# Patient Record
Sex: Female | Born: 1995 | Race: White | Hispanic: No | Smoking: Current every day smoker
Health system: Southern US, Community
[De-identification: ages and names within clinical notes are randomized; demographics above are authoritative.]

## PROBLEM LIST (undated history)

## (undated) ENCOUNTER — Inpatient Hospital Stay (HOSPITAL_COMMUNITY): Payer: Self-pay

## (undated) DIAGNOSIS — N926 Irregular menstruation, unspecified: Secondary | ICD-10-CM

## (undated) DIAGNOSIS — J069 Acute upper respiratory infection, unspecified: Secondary | ICD-10-CM

## (undated) DIAGNOSIS — F32A Depression, unspecified: Secondary | ICD-10-CM

## (undated) DIAGNOSIS — F329 Major depressive disorder, single episode, unspecified: Secondary | ICD-10-CM

## (undated) DIAGNOSIS — J45909 Unspecified asthma, uncomplicated: Secondary | ICD-10-CM

## (undated) DIAGNOSIS — F419 Anxiety disorder, unspecified: Secondary | ICD-10-CM

## (undated) DIAGNOSIS — R51 Headache: Secondary | ICD-10-CM

## (undated) DIAGNOSIS — R87629 Unspecified abnormal cytological findings in specimens from vagina: Secondary | ICD-10-CM

## (undated) DIAGNOSIS — F4310 Post-traumatic stress disorder, unspecified: Secondary | ICD-10-CM

## (undated) DIAGNOSIS — R519 Headache, unspecified: Secondary | ICD-10-CM

## (undated) DIAGNOSIS — R0600 Dyspnea, unspecified: Secondary | ICD-10-CM

## (undated) DIAGNOSIS — I10 Essential (primary) hypertension: Secondary | ICD-10-CM

## (undated) HISTORY — DX: Irregular menstruation, unspecified: N92.6

## (undated) HISTORY — PX: CYST REMOVAL NECK: SHX6281

## (undated) HISTORY — PX: CHOLECYSTECTOMY: SHX55

## (undated) HISTORY — PX: TYMPANOSTOMY TUBE PLACEMENT: SHX32

## (undated) HISTORY — DX: Post-traumatic stress disorder, unspecified: F43.10

---
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
## 2013-03-18 LAB — OB RESULTS CONSOLE RPR: RPR: NONREACTIVE

## 2013-05-07 ENCOUNTER — Encounter: Payer: Self-pay | Admitting: Women's Health

## 2013-05-07 ENCOUNTER — Ambulatory Visit (INDEPENDENT_AMBULATORY_CARE_PROVIDER_SITE_OTHER): Payer: Medicaid Other | Admitting: Women's Health

## 2013-05-07 ENCOUNTER — Encounter: Payer: Medicaid Other | Admitting: Women's Health

## 2013-05-07 VITALS — BP 120/66 | Wt 215.0 lb

## 2013-05-07 DIAGNOSIS — Z3400 Encounter for supervision of normal first pregnancy, unspecified trimester: Secondary | ICD-10-CM | POA: Insufficient documentation

## 2013-05-07 DIAGNOSIS — Z3403 Encounter for supervision of normal first pregnancy, third trimester: Secondary | ICD-10-CM

## 2013-05-07 DIAGNOSIS — Z331 Pregnant state, incidental: Secondary | ICD-10-CM

## 2013-05-07 DIAGNOSIS — O368131 Decreased fetal movements, third trimester, fetus 1: Secondary | ICD-10-CM

## 2013-05-07 DIAGNOSIS — O99340 Other mental disorders complicating pregnancy, unspecified trimester: Secondary | ICD-10-CM

## 2013-05-07 DIAGNOSIS — O26899 Other specified pregnancy related conditions, unspecified trimester: Secondary | ICD-10-CM | POA: Insufficient documentation

## 2013-05-07 DIAGNOSIS — Z8659 Personal history of other mental and behavioral disorders: Secondary | ICD-10-CM | POA: Insufficient documentation

## 2013-05-07 DIAGNOSIS — F17200 Nicotine dependence, unspecified, uncomplicated: Secondary | ICD-10-CM

## 2013-05-07 DIAGNOSIS — A568 Sexually transmitted chlamydial infection of other sites: Secondary | ICD-10-CM

## 2013-05-07 DIAGNOSIS — F32A Depression, unspecified: Secondary | ICD-10-CM

## 2013-05-07 DIAGNOSIS — O360131 Maternal care for anti-D [Rh] antibodies, third trimester, fetus 1: Secondary | ICD-10-CM

## 2013-05-07 DIAGNOSIS — O09899 Supervision of other high risk pregnancies, unspecified trimester: Secondary | ICD-10-CM | POA: Insufficient documentation

## 2013-05-07 DIAGNOSIS — Z1389 Encounter for screening for other disorder: Secondary | ICD-10-CM

## 2013-05-07 DIAGNOSIS — O368190 Decreased fetal movements, unspecified trimester, not applicable or unspecified: Secondary | ICD-10-CM

## 2013-05-07 DIAGNOSIS — F329 Major depressive disorder, single episode, unspecified: Secondary | ICD-10-CM

## 2013-05-07 DIAGNOSIS — O360990 Maternal care for other rhesus isoimmunization, unspecified trimester, not applicable or unspecified: Secondary | ICD-10-CM

## 2013-05-07 LAB — POCT URINALYSIS DIPSTICK
Blood, UA: NEGATIVE
Glucose, UA: NEGATIVE
Nitrite, UA: NEGATIVE

## 2013-05-07 NOTE — Patient Instructions (Addendum)
Third Trimester of Pregnancy The third trimester is from week 29 through week 42, months 7 through 9. The third trimester is a time when the fetus is growing rapidly. At the end of the ninth month, the fetus is about 20 inches in length and weighs 6 10 pounds.  BODY CHANGES Your body goes through many changes during pregnancy. The changes vary from woman to woman.   Your weight will continue to increase. You can expect to gain 25 35 pounds (11 16 kg) by the end of the pregnancy.  You may begin to get stretch marks on your hips, abdomen, and breasts.  You may urinate more often because the fetus is moving lower into your pelvis and pressing on your bladder.  You may develop or continue to have heartburn as a result of your pregnancy.  You may develop constipation because certain hormones are causing the muscles that push waste through your intestines to slow down.  You may develop hemorrhoids or swollen, bulging veins (varicose veins).  You may have pelvic pain because of the weight gain and pregnancy hormones relaxing your joints between the bones in your pelvis. Back aches may result from over exertion of the muscles supporting your posture.  Your breasts will continue to grow and be tender. A yellow discharge may leak from your breasts called colostrum.  Your belly button may stick out.  You may feel short of breath because of your expanding uterus.  You may notice the fetus "dropping," or moving lower in your abdomen.  You may have a bloody mucus discharge. This usually occurs a few days to a week before labor begins.  Your cervix becomes thin and soft (effaced) near your due date. WHAT TO EXPECT AT YOUR PRENATAL EXAMS  You will have prenatal exams every 2 weeks until week 36. Then, you will have weekly prenatal exams. During a routine prenatal visit:  You will be weighed to make sure you and the fetus are growing normally.  Your blood pressure is taken.  Your abdomen will be  measured to track your baby's growth.  The fetal heartbeat will be listened to.  Any test results from the previous visit will be discussed.  You may have a cervical check near your due date to see if you have effaced. At around 36 weeks, your caregiver will check your cervix. At the same time, your caregiver will also perform a test on the secretions of the vaginal tissue. This test is to determine if a type of bacteria, Group B streptococcus, is present. Your caregiver will explain this further. Your caregiver may ask you:  What your birth plan is.  How you are feeling.  If you are feeling the baby move.  If you have had any abnormal symptoms, such as leaking fluid, bleeding, severe headaches, or abdominal cramping.  If you have any questions. Other tests or screenings that may be performed during your third trimester include:  Blood tests that check for low iron levels (anemia).  Fetal testing to check the health, activity level, and growth of the fetus. Testing is done if you have certain medical conditions or if there are problems during the pregnancy. FALSE LABOR You may feel small, irregular contractions that eventually go away. These are called Braxton Hicks contractions, or false labor. Contractions may last for hours, days, or even weeks before true labor sets in. If contractions come at regular intervals, intensify, or become painful, it is best to be seen by your caregiver.    SIGNS OF LABOR   Menstrual-like cramps.  Contractions that are 5 minutes apart or less.  Contractions that start on the top of the uterus and spread down to the lower abdomen and back.  A sense of increased pelvic pressure or back pain.  A watery or bloody mucus discharge that comes from the vagina. If you have any of these signs before the 37th week of pregnancy, call your caregiver right away. You need to go to the hospital to get checked immediately. HOME CARE INSTRUCTIONS   Avoid all  smoking, herbs, alcohol, and unprescribed drugs. These chemicals affect the formation and growth of the baby.  Follow your caregiver's instructions regarding medicine use. There are medicines that are either safe or unsafe to take during pregnancy.  Exercise only as directed by your caregiver. Experiencing uterine cramps is a good sign to stop exercising.  Continue to eat regular, healthy meals.  Wear a good support bra for breast tenderness.  Do not use hot tubs, steam rooms, or saunas.  Wear your seat belt at all times when driving.  Avoid raw meat, uncooked cheese, cat litter boxes, and soil used by cats. These carry germs that can cause birth defects in the baby.  Take your prenatal vitamins.  Try taking a stool softener (if your caregiver approves) if you develop constipation. Eat more high-fiber foods, such as fresh vegetables or fruit and whole grains. Drink plenty of fluids to keep your urine clear or pale yellow.  Take warm sitz baths to soothe any pain or discomfort caused by hemorrhoids. Use hemorrhoid cream if your caregiver approves.  If you develop varicose veins, wear support hose. Elevate your feet for 15 minutes, 3 4 times a day. Limit salt in your diet.  Avoid heavy lifting, wear low heal shoes, and practice good posture.  Rest a lot with your legs elevated if you have leg cramps or low back pain.  Visit your dentist if you have not gone during your pregnancy. Use a soft toothbrush to brush your teeth and be gentle when you floss.  A sexual relationship may be continued unless your caregiver directs you otherwise.  Do not travel far distances unless it is absolutely necessary and only with the approval of your caregiver.  Take prenatal classes to understand, practice, and ask questions about the labor and delivery.  Make a trial run to the hospital.  Pack your hospital bag.  Prepare the baby's nursery.  Continue to go to all your prenatal visits as directed  by your caregiver. SEEK MEDICAL CARE IF:  You are unsure if you are in labor or if your water has broken.  You have dizziness.  You have mild pelvic cramps, pelvic pressure, or nagging pain in your abdominal area.  You have persistent nausea, vomiting, or diarrhea.  You have a bad smelling vaginal discharge.  You have pain with urination. SEEK IMMEDIATE MEDICAL CARE IF:   You have a fever.  You are leaking fluid from your vagina.  You have spotting or bleeding from your vagina.  You have severe abdominal cramping or pain.  You have rapid weight loss or gain.  You have shortness of breath with chest pain.  You notice sudden or extreme swelling of your face, hands, ankles, feet, or legs.  You have not felt your baby move in over an hour.  You have severe headaches that do not go away with medicine.  You have vision changes. Document Released: 05/25/2001 Document Revised: 01/31/2013 Document Reviewed:   08/01/2012 [employer] Patient Information 2014 [employer]  Preterm Labor Information Preterm labor is when labor starts at less than 37 weeks of pregnancy. The normal length of a pregnancy is 39 to 41 weeks. CAUSES Often, there is no identifiable underlying cause as to why a woman goes into preterm labor. One of the most common known causes of preterm labor is infection. Infections of the uterus, cervix, vagina, amniotic sac, bladder, kidney, or even the lungs (pneumonia) can cause labor to start. Other suspected causes of preterm labor include:   Urogenital infections, such as yeast infections and bacterial vaginosis.   Uterine abnormalities (uterine shape, uterine septum, fibroids, or bleeding from the placenta).   A cervix that has been operated on (it may fail to stay closed).   Malformations in the fetus.   Multiple gestations (twins, triplets, and so on).   Breakage of the amniotic sac.  RISK FACTORS  Having a previous history of preterm labor.    Having premature rupture of membranes (PROM).   Having a placenta that covers the opening of the cervix (placenta previa).   Having a placenta that separates from the uterus (placental abruption).   Having a cervix that is too weak to hold the fetus in the uterus (incompetent cervix).   Having too much fluid in the amniotic sac (polyhydramnios).   Taking illegal drugs or smoking while pregnant.   Not gaining enough weight while pregnant.   Being younger than 18 and older than 19 years old.   Having a low socioeconomic status.   Being African American. SYMPTOMS Signs and symptoms of preterm labor include:   Menstrual-like cramps, abdominal pain, or back pain.  Uterine contractions that are regular, as frequent as six in an hour, regardless of their intensity (may be mild or painful).  Contractions that start on the top of the uterus and spread down to the lower abdomen and back.   A sense of increased pelvic pressure.   A watery or bloody mucus discharge that comes from the vagina.  TREATMENT Depending on the length of the pregnancy and other circumstances, your health care provider may suggest bed rest. If necessary, there are medicines that can be given to stop contractions and to mature the fetal lungs. If labor happens before 34 weeks of pregnancy, a prolonged hospital stay may be recommended. Treatment depends on the condition of both you and the fetus.  WHAT SHOULD YOU DO IF YOU THINK YOU ARE IN PRETERM LABOR? Call your health care provider right away. You will need to go to the hospital to get checked immediately. HOW CAN YOU PREVENT PRETERM LABOR IN FUTURE PREGNANCIES? You should:   Stop smoking if you smoke.  Maintain healthy weight gain and avoid chemicals and drugs that are not necessary.  Be watchful for any type of infection.  Inform your health care provider if you have a known history of preterm labor. Document Released: 08/21/2003 Document  Revised: 01/31/2013 Document Reviewed: 07/03/2012 [employer] Patient Information 2014 [employer].  

## 2013-05-07 NOTE — Progress Notes (Signed)
  Subjective:    [name] is a 17 y.o. G1P0 Caucasian female at [redacted]w[redacted]d by 12wk u/s, being seen today for her first obstetrical visit w/ [place].  She is a transfer from [facility] in [place] where she initiated pnc early first trimester. Her obstetrical history is significant for smoker. She was smoking 1.5ppd prior to pregnancy, now is down to 1/2ppd. H/o depression, no current meds, doing well. She had chlamydia early 1st trimester, was treated and had neg POC. She also had 1st trimester bleeding and received rhogam.  Her varicella is equivocal. She had normal genetic screening, 1hr glucola 123, declined CF screening, normal female anatomy u/s. Pregnancy history fully reviewed.   Patient reports no fm today, only hiccups. LOF when she woke up this am, panties were wet, none since. Denies vb.   Filed Vitals:   05/07/13 1600  BP: 120/66  Weight: 215 lb (97.523 kg)    HISTORY: OB History  Gravida Para Term Preterm AB SAB TAB Ectopic Multiple Living  1             # Outcome Date GA Lbr Len/2nd Weight Sex Delivery Anes PTL Lv  1 CUR              No past medical history on file. Past Surgical History  Procedure Laterality Date  . Tympanostomy tube placement    . Cyst removal neck    . Tympanostomy tube placement     Family History  Problem Relation Age of Onset  . Bipolar disorder Maternal Grandmother   . Heart disease Maternal Grandfather   . Thyroid disease Paternal Grandmother   . Arthritis Mother   . Fibromyalgia Mother   . Birth defects Father      Exam    Pelvic Exam:    Perineum: Normal Perineum   Vulva: normal   Vagina:  normal mucosa, normal discharge, no palpable nodules   Uterus   FH 33     Cervix: Normal, neg pooling. +mucoid d/c, neg wet prep, neg fern. 1/50/-3, vtx   Adnexa: Not palpable   Urinary: urethral meatus normal    System:     Skin: normal coloration and turgor, no rashes    Neurologic: oriented, normal mood   Extremities: normal strength, tone, and muscle mass   HEENT PERRLA   Mouth/Teeth mucous membranes moist   Cardiovascular: regular rate and rhythm   Respiratory:  appears well, vitals normal, no respiratory distress, acyanotic, normal RR   Abdomen: soft, non-tender    Thin prep pap smear n/a d/t age   Reactive NST: FHR 135, mod variability, 15x15accels, no decels=Cat I FHR UCs: none   Assessment:    Pregnancy: G1P0 There are no active problems to display for this patient.     [redacted]w[redacted]d G1P0 New OB visit, transfer from [place] Smoker H/O depression, no meds H/O chlamydia 1st trimester w/ neg POC Rh neg, has received 2 doses of Rhogam  Varicella equivocal Decreased FM w/ reactive NST    Plan:      Continue prenatal vitamins Problem list reviewed and updated Reviewed recommended weight gain based on pre-gravid BMI Encouraged well-balanced diet Genetic Screening discussed Integrated Screen: results reviewed Cystic fibrosis screening discussed declined Ultrasound discussed; fetal survey: results reviewed Follow up in 1 week for visit Reviewed ptl s/s, fkc To continue decreasing smoking w/ goal of cessation  Marge Duncans 05/07/2013 4:12 PM

## 2013-05-08 ENCOUNTER — Inpatient Hospital Stay (HOSPITAL_COMMUNITY): Payer: Medicaid Other

## 2013-05-08 ENCOUNTER — Inpatient Hospital Stay (HOSPITAL_COMMUNITY)
Admission: AD | Admit: 2013-05-08 | Discharge: 2013-05-08 | Disposition: A | Discharge status: Home / Self Care | Payer: Medicaid Other | Source: Ambulatory Visit | Attending: Family Medicine | Admitting: Family Medicine

## 2013-05-08 ENCOUNTER — Telehealth: Payer: Self-pay | Admitting: Obstetrics and Gynecology

## 2013-05-08 ENCOUNTER — Encounter: Payer: Self-pay | Admitting: *Deleted

## 2013-05-08 ENCOUNTER — Telehealth: Payer: Self-pay | Admitting: *Deleted

## 2013-05-08 ENCOUNTER — Encounter (HOSPITAL_COMMUNITY): Payer: Self-pay | Admitting: *Deleted

## 2013-05-08 DIAGNOSIS — O26853 Spotting complicating pregnancy, third trimester: Secondary | ICD-10-CM

## 2013-05-08 DIAGNOSIS — R109 Unspecified abdominal pain: Secondary | ICD-10-CM | POA: Insufficient documentation

## 2013-05-08 DIAGNOSIS — O26859 Spotting complicating pregnancy, unspecified trimester: Secondary | ICD-10-CM

## 2013-05-08 DIAGNOSIS — O4690 Antepartum hemorrhage, unspecified, unspecified trimester: Secondary | ICD-10-CM | POA: Insufficient documentation

## 2013-05-08 DIAGNOSIS — O4700 False labor before 37 completed weeks of gestation, unspecified trimester: Secondary | ICD-10-CM | POA: Insufficient documentation

## 2013-05-08 DIAGNOSIS — M549 Dorsalgia, unspecified: Secondary | ICD-10-CM | POA: Insufficient documentation

## 2013-05-08 LAB — COMPREHENSIVE METABOLIC PANEL
ALT: 9 U/L (ref 0–35)
AST: 11 U/L (ref 0–37)
Alkaline Phosphatase: 164 U/L — ABNORMAL HIGH (ref 39–117)
CO2: 23 mEq/L (ref 19–32)
Calcium: 9 mg/dL (ref 8.4–10.5)
Chloride: 103 mEq/L (ref 96–112)
GFR calc non Af Amer: >90 mL/min (ref >90)
Potassium: 4 mEq/L (ref 3.5–5.1)
Sodium: 137 mEq/L (ref 135–145)
Total Bilirubin: 0.2 mg/dL — ABNORMAL LOW (ref 0.3–1.2)

## 2013-05-08 LAB — URINALYSIS, ROUTINE W REFLEX MICROSCOPIC
Bilirubin Urine: NEGATIVE
Glucose, UA: NEGATIVE mg/dL
Nitrite: NEGATIVE
Urobilinogen, UA: 0.2 mg/dL (ref 0.0–1.0)

## 2013-05-08 LAB — CBC
Platelets: 187 10*3/uL (ref 150–400)
RBC: 4.04 MIL/uL (ref 3.87–5.11)
WBC: 12.2 10*3/uL — ABNORMAL HIGH (ref 4.0–10.5)

## 2013-05-08 LAB — WET PREP, GENITAL: Yeast Wet Prep HPF POC: NONE SEEN

## 2013-05-08 LAB — URINE MICROSCOPIC-ADD ON

## 2013-05-08 MED ORDER — ACETAMINOPHEN 325 MG PO TABS
650.0000 mg | ORAL_TABLET | Freq: Four times a day (QID) | ORAL | Status: DC | PRN
  Administered 2013-05-08: 650 mg via ORAL
  Filled 2013-05-08: qty 2

## 2013-05-08 MED ORDER — RHO D IMMUNE GLOBULIN 1500 UNIT/2ML IJ SOLN
300.0000 ug | Freq: Once | INTRAMUSCULAR | Status: DC
  Filled 2013-05-08: qty 2

## 2013-05-08 NOTE — Telephone Encounter (Signed)
Pt called back with an update. Still cramping and just spotting, bright red. I spoke with Joellyn Haff, CNM. She don't think bright red spotting is related to the exam from yesterday. Kim advised to schedule an appt for tomorrow or pt can go to [facility] for eval. Call transferred to front desk to schedule appt for tomorrow, but was advised if cramping got worse or bleeding got worse, [facility] to [facility]. Pt voiced understanding. JSY

## 2013-05-08 NOTE — Telephone Encounter (Signed)
Spoke with pt. Had an internal exam yesterday. Has been spotting since then. Not as heavy now. + menstral cramps. Advised to keep a check on it. Should gradually go away. To call at lunch time with an update. Pt voiced understanding. JSY

## 2013-05-08 NOTE — Telephone Encounter (Signed)
Pt calling back after speaking with Malachy Mood, LPN this afternoon, states continue to have some brownish discharge and bright red blood, "just not feeling well." Pt informed to go to [facility] to be evaluated. Pt verbalized understanding.

## 2013-05-08 NOTE — MAU Provider Note (Signed)
History    CSN: [id number] Arrival date and time: 05/08/13 1719 Chief Complaint  Patient presents with  . Vaginal Bleeding  . Abdominal Cramping  . Back Pain   Vaginal Bleeding Associated symptoms include back pain.  Abdominal Cramping  Back Pain   [name] is a 17 y.o. G1P0 at [redacted]w[redacted]d complaining of vaginal bleeding. She has wiped blood every time she uses the restroom, sometimes bright red, sometimes brown. She denies other discharge/odor. She woke up yesterday and was "soaked" with clear fluid that had no odor, and mentions having seen a mucous plug. She has intermittent back pain somewhat responsive to tylenol and more mild, cramping pains in her lower abdomen a few times a day. These have not changed recently and are not regular.   Received early [facility] at [facility] in [place], and has transferred to [facility] since moving from [place].  Pregnancy is significant for 0.5-1.0 ppd smoking, receiving rhogam in 1st trimester and at 28wks for bleeding and spotting, chlamydia infection in 1st trimester with negative TOC  She has occasional headaches without changes of vision. No epigastric/RUQ pain. Minimal swelling in lower extremities.   OB History   Grav Para Term Preterm Abortions TAB SAB Ect Mult Living   1               History reviewed. No pertinent past medical history.  Past Surgical History  Procedure Laterality Date  . Tympanostomy tube placement    . Cyst removal neck    . Tympanostomy tube placement      Family History  Problem Relation Age of Onset  . Bipolar disorder Maternal Grandmother   . Heart disease Maternal Grandfather   . Thyroid disease Paternal Grandmother   . Arthritis Mother   . Fibromyalgia Mother   . Birth defects Father     History  Substance Use Topics  . Smoking status: Current Every Day Smoker -- 0.50 packs/day    Types: Cigarettes  . Smokeless tobacco: Not on file  . Alcohol Use: No    Allergies: No Known  Allergies  Prescriptions prior to admission  Medication Sig Dispense Refill  . ranitidine (ZANTAC) 150 MG tablet Take 150 mg by mouth at bedtime as needed for heartburn.        Review of Systems  Genitourinary: Positive for vaginal bleeding.  Musculoskeletal: Positive for back pain.   Physical Exam   Blood pressure 145/78, pulse 89, temperature 98.1 F (36.7 C), temperature source Oral, resp. rate 18, height 5\' 6"  (1.676 m), weight 98.612 kg (217 lb 6.4 oz), SpO2 100.00%. Filed Vitals:   05/08/13 1736  BP: 145/78  Pulse: 89  Temp: 98.1 F (36.7 C)  TempSrc: Oral  Resp: 18  Height: 5\' 6"  (1.676 m)  Weight: 98.612 kg (217 lb 6.4 oz)  SpO2: 100%   Physical Exam  Constitutional: She is oriented to person, place, and time. She appears well-developed and well-nourished. No distress.  HENT:  Head: Normocephalic and atraumatic.  Eyes: Conjunctivae and EOM are normal. No scleral icterus.  Neck: Normal range of motion. Neck supple.  Cardiovascular: Normal heart sounds and intact distal pulses.  Exam reveals no gallop.   No murmur heard. Respiratory: Effort normal and breath sounds normal. No respiratory distress. She has no rales.  GI: Soft. Bowel sounds are normal. There is no tenderness.  Musculoskeletal: Normal range of motion. She exhibits edema (trace).  Neurological: She is alert and oriented to person, place, and time. She has normal  reflexes.  Sterile Speculum Exam: external genitalia without lesions, cloudy white mucous pooling in vagina with spots of blood, without obvious pooling of clear fluid.   FHTs: baseline 140bpm, 15 x 15 accels, no decels  Contractions: Rare  Procedures   Results for orders placed during the hospital encounter of 05/08/13 (from the past 24 hour(s))  URINALYSIS, ROUTINE W REFLEX MICROSCOPIC     Status: Abnormal   Collection Time    05/08/13  6:35 PM      Result Value Range   Color, Urine YELLOW  YELLOW   APPearance CLEAR  CLEAR   Specific  Gravity, Urine 1.020  1.005 - 1.030   pH 6.5  5.0 - 8.0   Glucose, UA NEGATIVE  NEGATIVE mg/dL   Hgb urine dipstick MODERATE (*) NEGATIVE   Bilirubin Urine NEGATIVE  NEGATIVE   Ketones, ur NEGATIVE  NEGATIVE mg/dL   Protein, ur NEGATIVE  NEGATIVE mg/dL   Urobilinogen, UA 0.2  0.0 - 1.0 mg/dL   Nitrite NEGATIVE  NEGATIVE   Leukocytes, UA SMALL (*) NEGATIVE  URINE MICROSCOPIC-ADD ON     Status: Abnormal   Collection Time    05/08/13  6:35 PM      Result Value Range   Squamous Epithelial / LPF FEW (*) RARE   WBC, UA 3-6  <3 WBC/hpf   RBC / HPF 0-2  <3 RBC/hpf   Bacteria, UA RARE  RARE  CBC     Status: Abnormal   Collection Time    05/08/13  6:40 PM      Result Value Range   WBC 12.2 (*) 4.0 - 10.5 K/uL   RBC 4.04  3.87 - 5.11 MIL/uL   Hemoglobin 12.0  12.0 - 15.0 g/dL   HCT 16.1 (*) 09.6 - 04.5 %   MCV 83.7  78.0 - 100.0 fL   MCH 29.7  26.0 - 34.0 pg   MCHC 35.5  30.0 - 36.0 g/dL   RDW 40.9  81.1 - 91.4 %   Platelets 187  150 - 400 K/uL  COMPREHENSIVE METABOLIC PANEL     Status: Abnormal   Collection Time    05/08/13  6:40 PM      Result Value Range   Sodium 137  135 - 145 mEq/L   Potassium 4.0  3.5 - 5.1 mEq/L   Chloride 103  96 - 112 mEq/L   CO2 23  19 - 32 mEq/L   Glucose, Bld 80  70 - 99 mg/dL   BUN 7  6 - 23 mg/dL   Creatinine, Ser 7.82  0.50 - 1.10 mg/dL   Calcium 9.0  8.4 - 95.6 mg/dL   Total Protein 6.5  6.0 - 8.3 g/dL   Albumin 2.8 (*) 3.5 - 5.2 g/dL   AST 11  0 - 37 U/L   ALT 9  0 - 35 U/L   Alkaline Phosphatase 164 (*) 39 - 117 U/L   Total Bilirubin 0.2 (*) 0.3 - 1.2 mg/dL   GFR calc non Af Amer >90  >90 mL/min   GFR calc Af Amer >90  >90 mL/min  WET PREP, GENITAL     Status: Abnormal   Collection Time    05/08/13  8:45 PM      Result Value Range   Yeast Wet Prep HPF POC NONE SEEN  NONE SEEN   Trich, Wet Prep NONE SEEN  NONE SEEN   Clue Cells Wet Prep HPF POC NONE SEEN  NONE SEEN  WBC, Wet Prep HPF POC MODERATE (*) NONE SEEN  POCT FERN TEST      Status: None   Collection Time    05/08/13 10:13 PM      Result Value Range   POCT Fern Test Negative = intact amniotic membranes     Assessment: Vaginal bleeding: Cervical exam shows no significant cervical change. U/S showed fundal placenta, no evidence of previa or abruption and AFI 58%ile. History of first trimester spotting x 1 and spotting after cervical exam x 1 after cervical exam in 2nd trimester. Bleeding today suggestive of bloody show from [facility] UC's.  - wet prep - ferning slide - GC/chlamydia  Fetal well-being: fetal monitoring with reactive, category I tracing.  Plan:  D/C home in stable condition.  Labor precautions and FKCs. Bleeding precautions. Pelvic rest x 1 week. Increase fluids and rest. Follow-up Information   Follow up with [facility] In 1 day. (as scheduled or as needed if symptoms worsen)    Contact information:   [address] [place] [address] [phone]      Follow up with [facility] [place] [facility]ERN[facility] ADMISS[facility]. (As needed if symptoms worsen)    Contact information:   [address] [id number] [place] [address] [phone]       Medication List         ranitidine 150 MG tablet  Commonly known as:  ZANTAC  Take 150 mg by mouth at bedtime as needed for heartburn.        Hazeline Junker 05/08/2013, 6:04 PM

## 2013-05-08 NOTE — Progress Notes (Signed)
This encounter was created in error - please disregard.

## 2013-05-08 NOTE — MAU Note (Signed)
Patient presents to MAU with c/o lower cramping abdominal and back pain that's intermittent. Reports an episode of heavy bright red bleeding yesterday; states that bleeding has turned brown noticing each time she wipes. Denies LOF or contractions at this time. Reports good fetal movement.

## 2013-05-09 ENCOUNTER — Encounter: Payer: Medicaid Other | Admitting: Advanced Practice Midwife

## 2013-05-09 LAB — GC/CHLAMYDIA PROBE AMP: GC Probe RNA: NEGATIVE

## 2013-05-10 NOTE — MAU Provider Note (Signed)
Chart reviewed and agree with management and plan.  

## 2013-05-15 ENCOUNTER — Ambulatory Visit (INDEPENDENT_AMBULATORY_CARE_PROVIDER_SITE_OTHER): Payer: Medicaid Other | Admitting: Advanced Practice Midwife

## 2013-05-15 ENCOUNTER — Encounter: Payer: Self-pay | Admitting: Advanced Practice Midwife

## 2013-05-15 VITALS — BP 122/80 | Wt 218.0 lb

## 2013-05-15 DIAGNOSIS — Z331 Pregnant state, incidental: Secondary | ICD-10-CM

## 2013-05-15 DIAGNOSIS — O99340 Other mental disorders complicating pregnancy, unspecified trimester: Secondary | ICD-10-CM

## 2013-05-15 DIAGNOSIS — O360990 Maternal care for other rhesus isoimmunization, unspecified trimester, not applicable or unspecified: Secondary | ICD-10-CM

## 2013-05-15 DIAGNOSIS — Z1389 Encounter for screening for other disorder: Secondary | ICD-10-CM

## 2013-05-15 DIAGNOSIS — Z3400 Encounter for supervision of normal first pregnancy, unspecified trimester: Secondary | ICD-10-CM

## 2013-05-15 LAB — POCT URINALYSIS DIPSTICK
Blood, UA: NEGATIVE
Ketones, UA: NEGATIVE
Protein, UA: NEGATIVE

## 2013-05-15 NOTE — Progress Notes (Signed)
No c/o at this time.GBS collected.  Routine questions about pregnancy answered.  F/U in 1 weeks for LROB.

## 2013-05-16 ENCOUNTER — Encounter: Payer: Self-pay | Admitting: Advanced Practice Midwife

## 2013-05-16 ENCOUNTER — Telehealth: Payer: Self-pay | Admitting: Obstetrics and Gynecology

## 2013-05-16 ENCOUNTER — Ambulatory Visit (INDEPENDENT_AMBULATORY_CARE_PROVIDER_SITE_OTHER): Payer: Medicaid Other | Admitting: Advanced Practice Midwife

## 2013-05-16 VITALS — BP 128/80 | Wt 220.0 lb

## 2013-05-16 DIAGNOSIS — O99340 Other mental disorders complicating pregnancy, unspecified trimester: Secondary | ICD-10-CM

## 2013-05-16 DIAGNOSIS — O360990 Maternal care for other rhesus isoimmunization, unspecified trimester, not applicable or unspecified: Secondary | ICD-10-CM

## 2013-05-16 DIAGNOSIS — Z1389 Encounter for screening for other disorder: Secondary | ICD-10-CM

## 2013-05-16 DIAGNOSIS — Z331 Pregnant state, incidental: Secondary | ICD-10-CM

## 2013-05-16 LAB — POCT URINALYSIS DIPSTICK
Blood, UA: NEGATIVE
Ketones, UA: NEGATIVE

## 2013-05-16 NOTE — Telephone Encounter (Signed)
Pt states having vaginal bleeding with clots, grayish discharge with odor. Pt come in today to be evaluated.

## 2013-05-16 NOTE — Progress Notes (Signed)
Pt states that she had "a lot " of vaginal bleeding with 2 pea sized clots yesterday after vaginal exam .  Had grey mucus d/c (pt took a picture) today.  SSE:  No blood at all, normal appearing mucusy discharge.  Picture looks like mucus as well.  Fetus active.

## 2013-05-16 NOTE — Progress Notes (Signed)
Gray vaginal discharge

## 2013-05-23 ENCOUNTER — Ambulatory Visit (INDEPENDENT_AMBULATORY_CARE_PROVIDER_SITE_OTHER): Payer: Medicaid Other | Admitting: Obstetrics & Gynecology

## 2013-05-23 ENCOUNTER — Encounter: Payer: Self-pay | Admitting: Obstetrics & Gynecology

## 2013-05-23 VITALS — BP 120/80 | Wt 221.4 lb

## 2013-05-23 DIAGNOSIS — O360990 Maternal care for other rhesus isoimmunization, unspecified trimester, not applicable or unspecified: Secondary | ICD-10-CM

## 2013-05-23 DIAGNOSIS — Z3403 Encounter for supervision of normal first pregnancy, third trimester: Secondary | ICD-10-CM

## 2013-05-23 DIAGNOSIS — O99330 Smoking (tobacco) complicating pregnancy, unspecified trimester: Secondary | ICD-10-CM

## 2013-05-23 DIAGNOSIS — O99340 Other mental disorders complicating pregnancy, unspecified trimester: Secondary | ICD-10-CM

## 2013-05-23 NOTE — Progress Notes (Signed)
BP weight and urine results all reviewed and noted. Patient reports good fetal movement, denies any bleeding and no rupture of membranes symptoms or regular contractions. Patient is without complaints. All questions were answered.  

## 2013-05-30 ENCOUNTER — Ambulatory Visit (INDEPENDENT_AMBULATORY_CARE_PROVIDER_SITE_OTHER): Payer: Medicaid Other | Admitting: Advanced Practice Midwife

## 2013-05-30 ENCOUNTER — Encounter: Payer: Self-pay | Admitting: Advanced Practice Midwife

## 2013-05-30 VITALS — BP 124/80 | Wt 224.5 lb

## 2013-05-30 DIAGNOSIS — O360990 Maternal care for other rhesus isoimmunization, unspecified trimester, not applicable or unspecified: Secondary | ICD-10-CM

## 2013-05-30 DIAGNOSIS — Z1389 Encounter for screening for other disorder: Secondary | ICD-10-CM

## 2013-05-30 DIAGNOSIS — O99340 Other mental disorders complicating pregnancy, unspecified trimester: Secondary | ICD-10-CM

## 2013-05-30 DIAGNOSIS — O99330 Smoking (tobacco) complicating pregnancy, unspecified trimester: Secondary | ICD-10-CM

## 2013-05-30 DIAGNOSIS — O2390 Unspecified genitourinary tract infection in pregnancy, unspecified trimester: Secondary | ICD-10-CM

## 2013-05-30 DIAGNOSIS — Z331 Pregnant state, incidental: Secondary | ICD-10-CM

## 2013-05-30 LAB — POCT URINALYSIS DIPSTICK
Ketones, UA: NEGATIVE
Nitrite, UA: NEGATIVE

## 2013-05-30 NOTE — Progress Notes (Signed)
C/O panties being wet X 2 today.  None since.  SSE:  No pooling, negative fern and valsalva.  + Mucousy discharge   No c/o at this time.  Routine questions about pregnancy answered.  F/U in 1 weeks for LROB.

## 2013-06-04 ENCOUNTER — Encounter: Payer: Self-pay | Admitting: Advanced Practice Midwife

## 2013-06-04 ENCOUNTER — Ambulatory Visit (INDEPENDENT_AMBULATORY_CARE_PROVIDER_SITE_OTHER): Payer: Medicaid Other | Admitting: Advanced Practice Midwife

## 2013-06-04 VITALS — BP 136/74 | Wt 226.0 lb

## 2013-06-04 DIAGNOSIS — O9989 Other specified diseases and conditions complicating pregnancy, childbirth and the puerperium: Secondary | ICD-10-CM

## 2013-06-04 DIAGNOSIS — O99340 Other mental disorders complicating pregnancy, unspecified trimester: Secondary | ICD-10-CM

## 2013-06-04 DIAGNOSIS — O368191 Decreased fetal movements, unspecified trimester, fetus 1: Secondary | ICD-10-CM

## 2013-06-04 DIAGNOSIS — J029 Acute pharyngitis, unspecified: Secondary | ICD-10-CM

## 2013-06-04 DIAGNOSIS — O99330 Smoking (tobacco) complicating pregnancy, unspecified trimester: Secondary | ICD-10-CM

## 2013-06-04 DIAGNOSIS — Z1389 Encounter for screening for other disorder: Secondary | ICD-10-CM

## 2013-06-04 DIAGNOSIS — O360990 Maternal care for other rhesus isoimmunization, unspecified trimester, not applicable or unspecified: Secondary | ICD-10-CM

## 2013-06-04 DIAGNOSIS — Z331 Pregnant state, incidental: Secondary | ICD-10-CM

## 2013-06-04 DIAGNOSIS — O368190 Decreased fetal movements, unspecified trimester, not applicable or unspecified: Secondary | ICD-10-CM

## 2013-06-04 LAB — POCT URINALYSIS DIPSTICK
Blood, UA: NEGATIVE
Glucose, UA: NEGATIVE
Ketones, UA: NEGATIVE
Leukocytes, UA: NEGATIVE
Nitrite, UA: NEGATIVE
Protein, UA: NEGATIVE

## 2013-06-04 MED ORDER — AZITHROMYCIN 250 MG PO TABS: 250.0000 mg | ORAL_TABLET | Freq: Every day | ORAL | Status: DC

## 2013-06-04 NOTE — Addendum Note (Signed)
Addended by: Gaylyn Rong A on: 06/04/2013 04:59 PM   Modules accepted: Orders

## 2013-06-04 NOTE — Progress Notes (Signed)
Has had strep throat several times a year, and today feels like she has it.  Throat red, no white spots.  Culture taken, but will treat with a z pack based on sx. C/O decreased FM. NST reactive

## 2013-06-05 ENCOUNTER — Encounter: Payer: Medicaid Other | Admitting: Women's Health

## 2013-06-05 LAB — STREP A DNA PROBE: GASP: NEGATIVE

## 2013-06-10 ENCOUNTER — Encounter (HOSPITAL_COMMUNITY): Payer: Self-pay | Admitting: *Deleted

## 2013-06-10 ENCOUNTER — Inpatient Hospital Stay (HOSPITAL_COMMUNITY)
Admission: AD | Admit: 2013-06-10 | Discharge: 2013-06-10 | Disposition: A | Discharge status: Home / Self Care | Payer: Medicaid Other | Source: Ambulatory Visit | Attending: Obstetrics & Gynecology | Admitting: Obstetrics & Gynecology

## 2013-06-10 DIAGNOSIS — O479 False labor, unspecified: Secondary | ICD-10-CM | POA: Insufficient documentation

## 2013-06-10 MED ORDER — OXYCODONE-ACETAMINOPHEN 5-325 MG PO TABS
2.0000 | ORAL_TABLET | Freq: Once | ORAL | Status: AC
  Administered 2013-06-10: 2 via ORAL
  Filled 2013-06-10: qty 2

## 2013-06-10 NOTE — MAU Note (Signed)
Contractions since 2000. Some bleeding Sat morning but none since then

## 2013-06-11 ENCOUNTER — Telehealth: Payer: Self-pay | Admitting: Obstetrics & Gynecology

## 2013-06-12 ENCOUNTER — Encounter: Payer: Self-pay | Admitting: Advanced Practice Midwife

## 2013-06-12 ENCOUNTER — Encounter (INDEPENDENT_AMBULATORY_CARE_PROVIDER_SITE_OTHER): Payer: Self-pay

## 2013-06-12 ENCOUNTER — Encounter (HOSPITAL_COMMUNITY): Payer: Self-pay | Admitting: *Deleted

## 2013-06-12 ENCOUNTER — Observation Stay (HOSPITAL_COMMUNITY): Payer: Medicaid Other | Admitting: Anesthesiology

## 2013-06-12 ENCOUNTER — Ambulatory Visit (INDEPENDENT_AMBULATORY_CARE_PROVIDER_SITE_OTHER): Payer: Medicaid Other | Admitting: Advanced Practice Midwife

## 2013-06-12 ENCOUNTER — Encounter (HOSPITAL_COMMUNITY): Payer: Medicaid Other | Admitting: Anesthesiology

## 2013-06-12 ENCOUNTER — Inpatient Hospital Stay (HOSPITAL_COMMUNITY)
Admission: AD | Admit: 2013-06-12 | Discharge: 2013-06-15 | DRG: 775 | Disposition: A | Discharge status: Home / Self Care | Payer: Medicaid Other | Source: Ambulatory Visit | Attending: Obstetrics & Gynecology | Admitting: Obstetrics & Gynecology

## 2013-06-12 VITALS — BP 138/78 | Wt 229.0 lb

## 2013-06-12 DIAGNOSIS — Z331 Pregnant state, incidental: Secondary | ICD-10-CM

## 2013-06-12 DIAGNOSIS — O360131 Maternal care for anti-D [Rh] antibodies, third trimester, fetus 1: Secondary | ICD-10-CM

## 2013-06-12 DIAGNOSIS — O99330 Smoking (tobacco) complicating pregnancy, unspecified trimester: Secondary | ICD-10-CM

## 2013-06-12 DIAGNOSIS — O480 Post-term pregnancy: Principal | ICD-10-CM | POA: Diagnosis present

## 2013-06-12 DIAGNOSIS — O99019 Anemia complicating pregnancy, unspecified trimester: Secondary | ICD-10-CM

## 2013-06-12 DIAGNOSIS — O360990 Maternal care for other rhesus isoimmunization, unspecified trimester, not applicable or unspecified: Secondary | ICD-10-CM

## 2013-06-12 DIAGNOSIS — Z3403 Encounter for supervision of normal first pregnancy, third trimester: Secondary | ICD-10-CM

## 2013-06-12 DIAGNOSIS — Z1389 Encounter for screening for other disorder: Secondary | ICD-10-CM

## 2013-06-12 DIAGNOSIS — A568 Sexually transmitted chlamydial infection of other sites: Secondary | ICD-10-CM

## 2013-06-12 DIAGNOSIS — O99334 Smoking (tobacco) complicating childbirth: Secondary | ICD-10-CM | POA: Diagnosis present

## 2013-06-12 DIAGNOSIS — O99340 Other mental disorders complicating pregnancy, unspecified trimester: Secondary | ICD-10-CM

## 2013-06-12 DIAGNOSIS — O481 Prolonged pregnancy: Secondary | ICD-10-CM | POA: Diagnosis present

## 2013-06-12 HISTORY — DX: Depression, unspecified: F32.A

## 2013-06-12 HISTORY — DX: Anxiety disorder, unspecified: F41.9

## 2013-06-12 HISTORY — DX: Major depressive disorder, single episode, unspecified: F32.9

## 2013-06-12 LAB — POCT URINALYSIS DIPSTICK
Blood, UA: NEGATIVE
Glucose, UA: NEGATIVE
Nitrite, UA: NEGATIVE

## 2013-06-12 LAB — TYPE AND SCREEN: ABO/RH(D): O NEG

## 2013-06-12 LAB — CBC
HCT: 33.7 % — ABNORMAL LOW (ref 36.0–46.0)
Hemoglobin: 11.7 g/dL — ABNORMAL LOW (ref 12.0–15.0)
MCH: 29 pg (ref 26.0–34.0)
MCHC: 34.7 g/dL (ref 30.0–36.0)
RBC: 4.04 MIL/uL (ref 3.87–5.11)
RDW: 13.3 % (ref 11.5–15.5)

## 2013-06-12 LAB — ABO/RH: ABO/RH(D): O NEG

## 2013-06-12 MED ORDER — OXYTOCIN 40 UNITS IN LACTATED RINGERS INFUSION - SIMPLE MED
1.0000 m[IU]/min | INTRAVENOUS | Status: DC
  Administered 2013-06-12: 2 m[IU]/min via INTRAVENOUS
  Administered 2013-06-13: 666 m[IU]/min via INTRAVENOUS
  Filled 2013-06-12: qty 1000

## 2013-06-12 MED ORDER — PHENYLEPHRINE 40 MCG/ML (10ML) SYRINGE FOR IV PUSH (FOR BLOOD PRESSURE SUPPORT)
80.0000 ug | PREFILLED_SYRINGE | INTRAVENOUS | Status: DC | PRN
  Filled 2013-06-12: qty 2

## 2013-06-12 MED ORDER — ONDANSETRON HCL 4 MG/2ML IJ SOLN: 4.0000 mg | Freq: Four times a day (QID) | INTRAMUSCULAR | Status: DC | PRN

## 2013-06-12 MED ORDER — FENTANYL 2.5 MCG/ML BUPIVACAINE 1/10 % EPIDURAL INFUSION (WH - ANES)
14.0000 mL/h | INTRAMUSCULAR | Status: DC | PRN
  Administered 2013-06-13: 14 mL/h via EPIDURAL
  Filled 2013-06-12 (×2): qty 125

## 2013-06-12 MED ORDER — CITRIC ACID-SODIUM CITRATE 334-500 MG/5ML PO SOLN: 30.0000 mL | ORAL | Status: DC | PRN

## 2013-06-12 MED ORDER — OXYTOCIN BOLUS FROM INFUSION: 500.0000 mL | INTRAVENOUS | Status: DC

## 2013-06-12 MED ORDER — LIDOCAINE HCL (PF) 1 % IJ SOLN
INTRAMUSCULAR | Status: DC | PRN
  Administered 2013-06-12 (×2): 4 mL

## 2013-06-12 MED ORDER — LACTATED RINGERS IV SOLN
INTRAVENOUS | Status: DC
  Administered 2013-06-12 – 2013-06-13 (×3): via INTRAVENOUS

## 2013-06-12 MED ORDER — EPHEDRINE 5 MG/ML INJ
10.0000 mg | INTRAVENOUS | Status: DC | PRN
  Filled 2013-06-12: qty 4
  Filled 2013-06-12: qty 2

## 2013-06-12 MED ORDER — LIDOCAINE HCL (PF) 1 % IJ SOLN
30.0000 mL | INTRAMUSCULAR | Status: DC | PRN
  Filled 2013-06-12 (×2): qty 30

## 2013-06-12 MED ORDER — OXYCODONE-ACETAMINOPHEN 5-325 MG PO TABS
1.0000 | ORAL_TABLET | ORAL | Status: DC | PRN
  Administered 2013-06-13: 1 via ORAL
  Filled 2013-06-12: qty 1

## 2013-06-12 MED ORDER — ACETAMINOPHEN 325 MG PO TABS: 650.0000 mg | ORAL_TABLET | ORAL | Status: DC | PRN

## 2013-06-12 MED ORDER — IBUPROFEN 600 MG PO TABS
600.0000 mg | ORAL_TABLET | Freq: Four times a day (QID) | ORAL | Status: DC | PRN
  Administered 2013-06-13: 600 mg via ORAL
  Filled 2013-06-12: qty 1

## 2013-06-12 MED ORDER — DIPHENHYDRAMINE HCL 50 MG/ML IJ SOLN
12.5000 mg | INTRAMUSCULAR | Status: AC | PRN
  Administered 2013-06-13 (×3): 12.5 mg via INTRAVENOUS
  Filled 2013-06-12 (×2): qty 1

## 2013-06-12 MED ORDER — PHENYLEPHRINE 40 MCG/ML (10ML) SYRINGE FOR IV PUSH (FOR BLOOD PRESSURE SUPPORT)
80.0000 ug | PREFILLED_SYRINGE | INTRAVENOUS | Status: DC | PRN
  Filled 2013-06-12: qty 10
  Filled 2013-06-12: qty 2

## 2013-06-12 MED ORDER — LACTATED RINGERS IV SOLN
500.0000 mL | INTRAVENOUS | Status: DC | PRN
  Administered 2013-06-13 (×2): 300 mL via INTRAVENOUS

## 2013-06-12 MED ORDER — EPHEDRINE 5 MG/ML INJ
10.0000 mg | INTRAVENOUS | Status: DC | PRN
  Filled 2013-06-12: qty 2

## 2013-06-12 MED ORDER — OXYTOCIN 40 UNITS IN LACTATED RINGERS INFUSION - SIMPLE MED: 62.5000 mL/h | INTRAVENOUS | Status: DC

## 2013-06-12 MED ORDER — FENTANYL CITRATE 0.05 MG/ML IJ SOLN
100.0000 ug | INTRAMUSCULAR | Status: DC | PRN
  Administered 2013-06-12: 100 ug via INTRAVENOUS
  Filled 2013-06-12: qty 2

## 2013-06-12 MED ORDER — TERBUTALINE SULFATE 1 MG/ML IJ SOLN: 0.2500 mg | Freq: Once | INTRAMUSCULAR | Status: AC | PRN

## 2013-06-12 MED ORDER — FENTANYL 2.5 MCG/ML BUPIVACAINE 1/10 % EPIDURAL INFUSION (WH - ANES)
INTRAMUSCULAR | Status: DC | PRN
  Administered 2013-06-12: 14 mL/h via EPIDURAL

## 2013-06-12 MED ORDER — LACTATED RINGERS IV SOLN
500.0000 mL | Freq: Once | INTRAVENOUS | Status: AC
  Administered 2013-06-12: 500 mL via INTRAVENOUS

## 2013-06-12 NOTE — Progress Notes (Signed)
[name] is a 17 y.o. G1P0 at [redacted]w[redacted]d admitted for active labor.  Pt 7 cm at [facility] office, sent to [facility].  Pt augmented with Pitocin following no cervical change.    Subjective: Pt comfortable with epidural.  Family at bedside for support.    Objective: BP 124/78  Pulse 84  Temp(Src) 98.1 F (36.7 C) (Oral)  Resp 16  Ht 5\' 6"  (1.676 m)  Wt 103.874 kg (229 lb)  BMI 36.98 kg/m2  SpO2 100%  LMP 08/31/2012      FHT:  FHR: 130 bpm, variability: moderate,  accelerations:  Present,  decelerations:  Absent UC:   regular, every 2-3 minutes SVE:   Dilation: 7 Effacement (%): 90 Station: -1 Exam by:: Misty Stanley Leftwich-Kirby CNM IUPC placed without difficulty, pt tolerated well.    Labs: Lab Results  Component Value Date   WBC 13.9* 06/12/2013   HGB 11.7* 06/12/2013   HCT 33.7* 06/12/2013   MCV 83.4 06/12/2013   PLT 187 06/12/2013    Assessment / Plan: Protracted active phase  Labor: No cervical progress on Pitocin.  IUPC to evaluate Ctx. Preeclampsia:  n/a Fetal Wellbeing:  Category I Pain Control:  Epidural I/D:  n/a Anticipated MOD:  NSVD  LEFTWICH-KIRBY, Deniel Mcquiston 06/12/2013, 10:36 PM

## 2013-06-12 NOTE — Progress Notes (Signed)
No c/o at this time. Not having cramps or contractions.  cx exam reveals 7/90/BBOW.  Will send to [facility].  Dr. Adrian Blackwater notified.

## 2013-06-12 NOTE — Telephone Encounter (Signed)
Pt has an appt will discuss questions and concerns with provider.

## 2013-06-12 NOTE — Anesthesia Preprocedure Evaluation (Signed)
Anesthesia Evaluation  Patient identified by MRN, date of birth, ID band Patient awake    Reviewed: Allergy & Precautions, H&P , Patient's Chart, lab work & pertinent test results  Airway Mallampati: III TM Distance: >3 FB Neck ROM: Full    Dental no notable dental hx. (+) Teeth Intact   Pulmonary Current Smoker,  breath sounds clear to auscultation  Pulmonary exam normal       Cardiovascular negative cardio ROS  Rhythm:Regular Rate:Normal     Neuro/Psych PSYCHIATRIC DISORDERS Anxiety Depression negative neurological ROS     GI/Hepatic Neg liver ROS, GERD-  Medicated and Controlled,  Endo/Other  negative endocrine ROSObesity  Renal/GU negative Renal ROS  negative genitourinary   Musculoskeletal negative musculoskeletal ROS (+)   Abdominal (+) + obese,   Peds  Hematology negative hematology ROS (+)   Anesthesia Other Findings   Reproductive/Obstetrics (+) Pregnancy                           Anesthesia Physical Anesthesia Plan  ASA: II  Anesthesia Plan: Epidural   Post-op Pain Management:    Induction:   Airway Management Planned: Natural Airway  Additional Equipment:   Intra-op Plan:   Post-operative Plan:   Informed Consent: I have reviewed the patients History and Physical, chart, labs and discussed the procedure including the risks, benefits and alternatives for the proposed anesthesia with the patient or authorized representative who has indicated his/her understanding and acceptance.     Plan Discussed with: Anesthesiologist  Anesthesia Plan Comments:         Anesthesia Quick Evaluation

## 2013-06-12 NOTE — H&P (Signed)
[name] is a 17 y.o. female presenting for postdates induction. History 17yo G1 at 40.5 weeks who is admitted for postdates induction.  She was examined in the clinic today and was found to be 7cm dilated.  She denies contractions, vaginal bleeding, vaginal discharge.  She has some mild pelvic pain.  OB History   Grav Para Term Preterm Abortions TAB SAB Ect Mult Living   1              No past medical history on file. Past Surgical History  Procedure Laterality Date  . Tympanostomy tube placement    . Cyst removal neck    . Tympanostomy tube placement     Family History: family history includes Arthritis in her mother; Bipolar disorder in her maternal grandmother; Birth defects in her father; Fibromyalgia in her mother; Heart disease in her maternal grandfather; Thyroid disease in her paternal grandmother. Social History:  reports that she has been smoking Cigarettes.  She has been smoking about 0.50 packs per day. She has never used smokeless tobacco. She reports that she does not drink alcohol or use illicit drugs.   Review of Systems  All other systems reviewed and are negative.    Dilation: 7 Effacement (%): 90 Station: -1 Exam by:: J Ellerie Arenz Blood pressure 142/89, pulse 127, temperature 98 F (36.7 C), temperature source Oral, resp. rate 20, last menstrual period 08/31/2012. Maternal Exam:  Abdomen: Fundal height is term.   Estimated fetal weight is 8#.   Fetal presentation: vertex  Introitus: Normal vulva. Normal vagina.  Amniotic fluid character: clear.  Pelvis: adequate for delivery.   Cervix: Cervix evaluated by digital exam.     Fetal Exam Fetal Monitor Review: Mode: hand-held doppler probe.   Baseline rate: 140s.  Variability: moderate (6-25 bpm).   Pattern: accelerations present and no decelerations.    Fetal State Assessment: Category I - tracings are normal.     Physical Exam  Constitutional: She is oriented to person, place, and time. She  appears well-developed and well-nourished.  Respiratory: Effort normal.  GI: Soft. She exhibits no distension and no mass. There is no tenderness. There is no rebound and no guarding.  Musculoskeletal: Normal range of motion. She exhibits no edema.  Neurological: She is alert and oriented to person, place, and time.  Skin: Skin is warm and dry.  Psychiatric: She has a normal mood and affect. Her behavior is normal. Judgment and thought content normal.    Prenatal labs: ABO, Rh: O/Negative/-- (06/11 0000) Antibody: Positive (06/11 0000) Rubella: Immune (06/11 0000) RPR: Nonreactive (10/05 0000)  HBsAg: Negative (06/11 0000)  HIV: Non-reactive (10/05 0000)  GBS: NEGATIVE (12/02 1254)   Assessment/Plan: 1.  IUP at 40.5 weeks 2. Postdates 3.  GBS neg  AROM with clear fluid.  Will start pitocin to assist with contractions.  Uncertain for peds and birth control.  Anticipates breast feeding.  Anticipate vaginal delivery.   Philmore Lepore JEHIEL 06/12/2013, 5:18 PM

## 2013-06-12 NOTE — Anesthesia Procedure Notes (Signed)
Epidural Patient location during procedure: OB Start time: 06/12/2013 9:36 PM  Staffing Anesthesiologist: Verona Hartshorn A. Performed by: anesthesiologist   Preanesthetic Checklist Completed: patient identified, site marked, surgical consent, pre-op evaluation, timeout performed, IV checked, risks and benefits discussed and monitors and equipment checked  Epidural Patient position: sitting Prep: site prepped and draped and DuraPrep Patient monitoring: continuous pulse ox and blood pressure Approach: midline Injection technique: LOR air  Needle:  Needle type: Tuohy  Needle gauge: 17 G Needle length: 9 cm and 9 Needle insertion depth: 8 cm Catheter type: closed end flexible Catheter size: 19 Gauge Catheter at skin depth: 13 cm Test dose: negative and Other  Assessment Events: blood not aspirated, injection not painful, no injection resistance, negative IV test and no paresthesia  Additional Notes Patient identified. Risks and benefits discussed including failed block, incomplete  Pain control, post dural puncture headache, nerve damage, paralysis, blood pressure Changes, nausea, vomiting, reactions to medications-both toxic and allergic and post Partum back pain. All questions were answered. Patient expressed understanding and wished to proceed. Sterile technique was used throughout procedure. Epidural site was Dressed with sterile barrier dressing. No paresthesias, signs of intravascular injection Or signs of intrathecal spread were encountered.  Patient was more comfortable after the epidural was dosed. Please see RN's note for documentation of vital signs and FHR which are stable.

## 2013-06-13 ENCOUNTER — Encounter (HOSPITAL_COMMUNITY): Payer: Self-pay

## 2013-06-13 DIAGNOSIS — O99334 Smoking (tobacco) complicating childbirth: Secondary | ICD-10-CM

## 2013-06-13 DIAGNOSIS — O480 Post-term pregnancy: Secondary | ICD-10-CM

## 2013-06-13 LAB — RPR: RPR Ser Ql: NONREACTIVE

## 2013-06-13 MED ORDER — ZOLPIDEM TARTRATE 5 MG PO TABS: 5.0000 mg | ORAL_TABLET | Freq: Every evening | ORAL | Status: DC | PRN

## 2013-06-13 MED ORDER — SIMETHICONE 80 MG PO CHEW: 80.0000 mg | CHEWABLE_TABLET | ORAL | Status: DC | PRN

## 2013-06-13 MED ORDER — ONDANSETRON HCL 4 MG/2ML IJ SOLN: 4.0000 mg | INTRAMUSCULAR | Status: DC | PRN

## 2013-06-13 MED ORDER — TETANUS-DIPHTH-ACELL PERTUSSIS 5-2.5-18.5 LF-MCG/0.5 IM SUSP: 0.5000 mL | Freq: Once | INTRAMUSCULAR | Status: DC

## 2013-06-13 MED ORDER — DIBUCAINE 1 % RE OINT
1.0000 "application " | TOPICAL_OINTMENT | RECTAL | Status: DC | PRN
  Administered 2013-06-13: 1 via RECTAL
  Filled 2013-06-13: qty 28

## 2013-06-13 MED ORDER — BENZOCAINE-MENTHOL 20-0.5 % EX AERO
1.0000 "application " | INHALATION_SPRAY | CUTANEOUS | Status: DC | PRN
  Administered 2013-06-13: 1 via TOPICAL
  Filled 2013-06-13: qty 56

## 2013-06-13 MED ORDER — PRENATAL MULTIVITAMIN CH
1.0000 | ORAL_TABLET | Freq: Every day | ORAL | Status: DC
  Administered 2013-06-14: 1 via ORAL
  Filled 2013-06-13: qty 1

## 2013-06-13 MED ORDER — WITCH HAZEL-GLYCERIN EX PADS
1.0000 "application " | MEDICATED_PAD | CUTANEOUS | Status: DC | PRN
  Administered 2013-06-13: 1 via TOPICAL

## 2013-06-13 MED ORDER — IBUPROFEN 600 MG PO TABS
600.0000 mg | ORAL_TABLET | Freq: Four times a day (QID) | ORAL | Status: DC
  Administered 2013-06-13 – 2013-06-15 (×7): 600 mg via ORAL
  Filled 2013-06-13 (×8): qty 1

## 2013-06-13 MED ORDER — LANOLIN HYDROUS EX OINT: TOPICAL_OINTMENT | CUTANEOUS | Status: DC | PRN

## 2013-06-13 MED ORDER — OXYCODONE-ACETAMINOPHEN 5-325 MG PO TABS
1.0000 | ORAL_TABLET | ORAL | Status: DC | PRN
  Administered 2013-06-13 – 2013-06-15 (×4): 1 via ORAL
  Filled 2013-06-13 (×4): qty 1

## 2013-06-13 MED ORDER — ONDANSETRON HCL 4 MG PO TABS: 4.0000 mg | ORAL_TABLET | ORAL | Status: DC | PRN

## 2013-06-13 MED ORDER — SENNOSIDES-DOCUSATE SODIUM 8.6-50 MG PO TABS
2.0000 | ORAL_TABLET | ORAL | Status: DC
  Administered 2013-06-14 (×2): 2 via ORAL
  Filled 2013-06-13 (×2): qty 2

## 2013-06-13 MED ORDER — PNEUMOCOCCAL VAC POLYVALENT 25 MCG/0.5ML IJ INJ
0.5000 mL | INJECTION | INTRAMUSCULAR | Status: AC
  Administered 2013-06-14: 0.5 mL via INTRAMUSCULAR
  Filled 2013-06-13: qty 0.5

## 2013-06-13 MED ORDER — DIPHENHYDRAMINE HCL 25 MG PO CAPS: 25.0000 mg | ORAL_CAPSULE | Freq: Four times a day (QID) | ORAL | Status: DC | PRN

## 2013-06-13 NOTE — Progress Notes (Signed)
[name] is a 17 y.o. G1P0 at [redacted]w[redacted]d for active labor  Subjective: Pt comfortable with epidural, pushing with contractions.  RN called CNM to bedside to evaluate FHR tracing.    Objective: BP 117/60  Pulse 83  Temp(Src) 98.2 F (36.8 C) (Oral)  Resp 18  Ht 5\' 6"  (1.676 m)  Wt 103.874 kg (229 lb)  BMI 36.98 kg/m2  SpO2 100%  LMP 08/31/2012   Total I/O In: -  Out: 650 [Urine:650]  FHT:  FHR: 120 bpm, variability: moderate,  accelerations:  Present,  decelerations:  Present variables UC:   regular, every 2 minutes  Pt initially pushed after 1 hour of laboring down, but RN called CNM with concerns about cervical lip.  CNM rechecked and found pt to be 10 cm, but called Dr Debroah Loop to bedside to evaluate dilation.  Pt 10 cm and OK to resume pushing.  Pt reports mild vaginal pressure with contractions.   Reviewed FHR tracing.  RN at bedside unsure if baseline 145-150 with prolonged decel or baseline change.  Baseline similar to earlier baseline of 120s.  Good fetal scalp stimulation.  SVE:   Dilation: 10 Effacement (%): 100 Station: 0 Exam by:: Leftwich-Kirby CNM     Labs: Lab Results  Component Value Date   WBC 13.9* 06/12/2013   HGB 11.7* 06/12/2013   HCT 33.7* 06/12/2013   MCV 83.4 06/12/2013   PLT 187 06/12/2013    Assessment / Plan: Augmentation of labor, progressing well  Labor: Progressing normally Preeclampsia:  N/A Fetal Wellbeing:  Category II Pain Control:  Epidural I/D:  n/a Anticipated MOD:  NSVD  LEFTWICH-KIRBY, LISA 06/13/2013, 9:19 AM

## 2013-06-13 NOTE — Progress Notes (Signed)
[name] is a 17 y.o. G1P0 at [redacted]w[redacted]d for active labor  Subjective: Pt comfortable with epidural.  Family in room for support.   Objective: BP 113/69  Pulse 70  Temp(Src) 97.9 F (36.6 C) (Oral)  Resp 18  Ht 5\' 6"  (1.676 m)  Wt 103.874 kg (229 lb)  BMI 36.98 kg/m2  SpO2 100%  LMP 08/31/2012      FHT:  FHR: 135 bpm, variability: moderate,  accelerations:  Present,  decelerations:  Present prolonged x5 minutes during tachysystole, resolved with Pitocin decrease, fluids, position change UC:   regular, every 1-2 minutes SVE:   Dilation: 8.5 Effacement (%): 100 Station: -1 Exam by:: Foley RN  Labs: Lab Results  Component Value Date   WBC 13.9* 06/12/2013   HGB 11.7* 06/12/2013   HCT 33.7* 06/12/2013   MCV 83.4 06/12/2013   PLT 187 06/12/2013    Assessment / Plan: Augmentation of labor, progressing well  Labor: Progressing normally Preeclampsia:  N/A Fetal Wellbeing:  Category II Pain Control:  Epidural I/D:  n/a Anticipated MOD:  NSVD  LEFTWICH-KIRBY, Shamere Dilworth 06/13/2013, 1:50 AM

## 2013-06-13 NOTE — Progress Notes (Addendum)
[name] is a 17 y.o. G1P0 at [redacted]w[redacted]d admitted for active labor  Subjective: Pt comfortable with epidural.  Family at bedside for support.   Objective: BP 118/70  Pulse 80  Temp(Src) 98.1 F (36.7 C) (Oral)  Resp 18  Ht 5\' 6"  (1.676 m)  Wt 103.874 kg (229 lb)  BMI 36.98 kg/m2  SpO2 100%  LMP 08/31/2012      FHT:  FHR: 130 bpm, variability: moderate,  accelerations:  Abscent,  decelerations:  Absent UC:   regular, every 2 minutes, MVUs 130-160  SVE:   Dilation: 10 Effacement (%): 100 Station: 0 Exam by:: Leftwich-Kirby CNM  EFW by [employer] 8.5lbs    Labs: Lab Results  Component Value Date   WBC 13.9* 06/12/2013   HGB 11.7* 06/12/2013   HCT 33.7* 06/12/2013   MCV 83.4 06/12/2013   PLT 187 06/12/2013    Assessment / Plan: Augmentation of labor, progressing well  Labor: Progressing normally.  Labor down for 1 hour.  Test push after 1 hour.    Reviewed pt prenatal history, 1 hour glucose screen 123.    Preeclampsia:  N/A Fetal Wellbeing:  Category I Pain Control:  Epidural I/D:  n/a Anticipated MOD:  NSVD  LEFTWICH-KIRBY, Verina Galeno 06/13/2013, 5:25 AM

## 2013-06-13 NOTE — Lactation Note (Signed)
This note was copied from the chart of Boy Hailey Stormer. Lactation Consultation Note  Patient Name: Boy Selita Staiger FAOZH'Y Date: 06/13/2013 Reason for consult: Initial assessment;Difficult latch due to mom's large/soft breasts and everted nipples which tend to flatten when breast compressed.  Baby is 9 hours of age and has not been able to sustain latch for more than 5 minutes.  After bath, he was crying vigorously but is now sound asleep, despite LC attempt to awaken for feeding.  LC suggested trying a NS and with #24 NS, he is able to grasp areola but no sucking noted.    Use and cleaning of #24 NS shown to both parents and plan reviewed with RN, Beth. LC encouraged review of Baby and Me pp 9, 14 and 20-25 for STS and BF information. LC provided [employer] Resource brochure and reviewed [facility] services and list of community and web site resources.  LC encouraged STS and cue feedings ad lib, temporary use of NS as needed but try without as well.     Maternal Data Formula Feeding for Exclusion: No Infant to breast within first hour of birth: Yes Has patient been taught Hand Expression?: Yes Does the patient have breastfeeding experience prior to this delivery?: No  Feeding Feeding Type: Breast Fed Length of feed: 1 min  LATCH Score/Interventions Latch: Too sleepy or reluctant, no latch achieved, no sucking elicited. Intervention(s): Skin to skin;Teach feeding cues;Waking techniques  Audible Swallowing: None Intervention(s): Skin to skin;Hand expression  Type of Nipple: Everted at rest and after stimulation (flatten when breast compressed)  Comfort (Breast/Nipple): Soft / non-tender     Hold (Positioning): Assistance needed to correctly position infant at breast and maintain latch. Intervention(s): Breastfeeding basics reviewed;Support Pillows;Position options;Skin to skin (attempt to latch in football position on (R))  LATCH Score: 5  (using #24 NS after first attempting without NS)  Mom has  expressible colostrum  Lactation Tools Discussed/Used Tools: Nipple Shields Nipple shield size: 24 STS, cue feeding, hand expression  Consult Status Consult Status: Follow-up Date: 06/14/13 Follow-up type: In-patient    Warrick Parisian [facility] 06/13/2013, 8:32 PM

## 2013-06-14 MED ORDER — RHO D IMMUNE GLOBULIN 1500 UNIT/2ML IJ SOLN
300.0000 ug | Freq: Once | INTRAMUSCULAR | Status: AC
  Administered 2013-06-14: 300 ug via INTRAVENOUS
  Filled 2013-06-14: qty 2

## 2013-06-14 NOTE — Progress Notes (Signed)
CSW attempted to meet with MOB to complete assessment for anx/dep, but she was sleeping.  She woke up enough to request that CSW return at a later time.  CSW will attempt again later or in the morning.  CSW reviewed MOB's chart and notes hx of multiple [facility] admissions for suicide attempts (Most recent 2/12) and dx of Major Depressive Disorder.   

## 2013-06-14 NOTE — Progress Notes (Signed)
Post Partum Day 1 Subjective: no complaints, up ad lib, voiding, tolerating PO and + flatus, pt concerned by quantity of lochia  Objective: Blood pressure 96/62, pulse 91, temperature 97.6 F (36.4 C), temperature source Oral, resp. rate 20, height 5\' 6"  (1.676 m), weight 103.874 kg (229 lb), last menstrual period 08/31/2012, SpO2 100.00%, unknown if currently breastfeeding.  Physical Exam:  General: alert, cooperative, appears stated age and no distress Lochia: appropriate Uterine Fundus: firm DVT Evaluation: No evidence of DVT seen on physical exam. Negative Homan's sign. No cords or calf tenderness. No significant calf/ankle edema.   Recent Labs  06/12/13 1703  HGB 11.7*  HCT 33.7*    Assessment/Plan: Plan for discharge tomorrow, Breastfeeding and Contraception nexplanon Lochia appropriate after viewing pad. Low concern for PPH, will monitor.   LOS: 2 days   Lennis Korb RYAN 06/14/2013, 7:13 AM

## 2013-06-14 NOTE — Anesthesia Postprocedure Evaluation (Signed)
  Anesthesia Post-op Note  Patient: [name]  Procedure(s) Performed: * No procedures listed *  Patient Location: PACU and Mother/Baby  Anesthesia Type:Epidural  Level of Consciousness: awake, alert  and oriented  Airway and Oxygen Therapy: Patient Spontanous Breathing  Post-op Pain: mild  Post-op Assessment: PATIENT'S CARDIOVASCULAR STATUS UNSTABLE, Respiratory Function Stable, No signs of Nausea or vomiting and Pain level controlled  Post-op Vital Signs: stable  Complications: No apparent anesthesia complications

## 2013-06-15 LAB — RH IG WORKUP (INCLUDES ABO/RH)
ABO/RH(D): O NEG
Fetal Screen: NEGATIVE
Gestational Age(Wks): 40.6
Unit division: 0

## 2013-06-15 MED ORDER — IBUPROFEN 600 MG PO TABS: 600.0000 mg | ORAL_TABLET | Freq: Four times a day (QID) | ORAL | Status: DC

## 2013-06-15 NOTE — Discharge Summary (Signed)
Attestation of Attending Supervision of Advanced Practitioner (PA/CNM/NP): Evaluation and management procedures were performed by the Advanced Practitioner under my supervision and collaboration.  I have reviewed the Advanced Practitioner's note and chart, and I agree with the management and plan.  Ethlyn Alto, MD, FACOG Attending Obstetrician & Gynecologist Faculty Practice, [facility]  

## 2013-06-15 NOTE — Progress Notes (Signed)
UR chart review completed.  

## 2013-06-15 NOTE — Progress Notes (Signed)
Clinical Social Work Department PSYCHOSOCIAL ASSESSMENT - MATERNAL/CHILD 06/15/2013  Patient:  [name],[name]  Account Number:  [id number]  Admit Date:  06/12/2013  Childs Name:   [name]    Clinical Social Worker:  Editha Bridgeforth, LCSW   Date/Time:  06/15/2013 10:00 AM  Date Referred:  06/14/2013   Referral source  CN     Referred reason  Behavioral Health Issues   Other referral source:    I:  FAMILY / HOME ENVIRONMENT Child's legal guardian:  PARENT  Guardian - Name Guardian - Age Guardian - Address  [name] 17 [address] [place], [place] [address]  [name]  [place]   Other household support members/support persons Name Relationship DOB   GRAND MOTHER    Other support:   MOB states she has a good support system.  She lives with her mother, whom she states is supportive.  She and FOB are in a relationship and she states he is supportive also, although they are not currently living together.    II  PSYCHOSOCIAL DATA Information Source:  Patient Interview  Financial and Community Resources Employment:   Financial resources:  Medicaid If Medicaid - County:  [place]  School / Grade:   Maternity Care Coordinator / Child Services Coordination / Early Interventions:  Cultural issues impacting care:   none stated    III  STRENGTHS Strengths  Adequate Resources  Compliance with medical plan  Home prepared for Child (including basic supplies)  Other - See comment  Supportive family/friends   Strength comment:  [facility]   IV  RISK FACTORS AND CURRENT PROBLEMS Current Problem:  YES   Risk Factor & Current Problem Patient Issue Family Issue Risk Factor / Current Problem Comment  Mental Illness Y N MOB has significant hx of Depression   N N     V  SOCIAL WORK ASSESSMENT  CSW met with MOB in her first floor room/116 to complete assessment for hx of Depression.  CSW has reviewed MOB's medical record, which notes a hx  of Major Depressive Disorder and [facility] for depression and suicide attempts.  There has been no attempt or hospitalization documented in our system, however, since February 2012, when the patient was 14.  MOB was very pleasant and welcomed CSW into the room.  She was adjusting baby's outfit and acted very lovingly toward him as we talked.  CSW explained the reason for the visit, which was mainly to discuss ancy current depression symptoms/concerns, ensure good supports in place and discuss signs and symptoms of PPD and what to do should they occur.  MOB was very open to CSW's intervention.  She states she has struggled with depression in the past, but states she is feeling well at this time.  She admits to depression as recently as a few months ago and attributes it to the living situation she was in.  She states she and FOB were living in an apartment complex in [place] and it was not a good area.  She states she moved back in with her mother in [place], where she currently resides, and she immediately felt much better.  She and FOB are in a committed relationship and hope to find a suitable place together again, but she realized that the apartment he is still living in is not a good place for her and their new baby.  She states she has a good relationship with her mother and that   her mother is very supportive.  MOB states she does not have a counselor now and is not currently taking medication.  She reports not feeling either is necessary at this time, but was open to CSW's recommendation of information regarding who to call if depression symptoms arise.  CSW provided MOB with phone number for [facility] in [place], [place] and MOB commits to calling if needed.  CSW also states she may call her OB if she has concerns about her post partum emotions at any time.  She agreed.  CSW talked in depth about signs and symptoms of PPD and ensured she knows how common  these symptoms are and that she may be at a heightened risk given her hx of depression.  She was engaged in the conversation and understanding.  FOB came into the room and MOB stated that we could continue to talk.  CSW briefed him on what to watch for and he agreed to support MOB and watch for sign and symptoms of PPD as well.  He states he is trying to find a suitable place for his family to be together, but had to make a tought decision for them to split up because he knew that was best for Franki and the new baby.  CSW commends them for this.  Couple states they have everything they need for baby at home.  Although FOB is not living in the home with MOB and baby, they state his relationship with MGM is good and that he will be very involved with baby.  Couple has no questions, needs or concerns at this time.  CSW identifies no barriers to discharge.   VI SOCIAL WORK PLAN Social Work Plan  No Further Intervention Required / No Barriers to Discharge  Patient/Family Education   Type of pt/family education:   In depth conversation regarding signs and symptoms of PPD and what to do if they occur.   If child protective services report - county:   If child protective services report - date:   Information/referral to community resources comment:   CSW provided information for [facility] if she feels she needs mental health treatment.   Other social work plan:    

## 2013-06-15 NOTE — Discharge Summary (Signed)
Obstetric Discharge Summary Reason for Admission: induction of labor Prenatal Procedures: NST Intrapartum Procedures: spontaneous vaginal delivery Postpartum Procedures: none Complications-Operative and Postpartum: 2nd degree perineal laceration Hemoglobin  Date Value Range Status  06/12/2013 11.7* 12.0 - 15.0 g/dL Final     HCT  Date Value Range Status  06/12/2013 33.7* 36.0 - 46.0 % Final  Hospital Course: 18yo G1 at 40.5 weeks who is admitted for postdates induction. She was examined in the clinic today and was found to be 7cm dilated. She denies contractions, vaginal bleeding, vaginal discharge. She has some mild pelvic pain.  Delivery Note  At 11:28 AM a viable and healthy female was delivered via Vaginal, Spontaneous Delivery (Presentation: Left Occiput Anterior). Shoulders delivery easily. APGAR: 9, 9; weight pending.  Placenta status: Intact, Spontaneous. Cord: 3 vessels with the following complications: None. Cord pH: n/a  Anesthesia: Epidural  Episiotomy: None  Lacerations: 2nd degree;Perineal  Suture Repair: 3.0 vicryl rapide  Est. Blood Loss (mL): 350  Mom to postpartum. Baby to Couplet care / Skin to Skin.  [facility],[name]  06/13/2013, 12:00 PM  Has done well and is ready to go home.  Physical Exam:  General: alert and no distress Lochia: appropriate Uterine Fundus: firm Incision: healing well DVT Evaluation: No evidence of DVT seen on physical exam.  Discharge Diagnoses: Term Pregnancy-delivered  Discharge Information: Date: 06/15/2013 Activity: unrestricted and pelvic rest Diet: routine Medications: PNV and Ibuprofen Condition: stable Instructions: refer to practice specific booklet Discharge to: home   Newborn Data: Live born female  Birth Weight: 8 lb 0.2 oz (3635 g) APGAR: 9, 9  Home with mother.  [facility] Center[facility]ILL[facility],Sebastain Fishbaugh 06/15/2013, 7:31 AM

## 2013-06-15 NOTE — Discharge Instructions (Signed)

## 2013-06-15 NOTE — Lactation Note (Signed)
This note was copied from the chart of [name]. Lactation Consultation Note  Delice Leschatient Name: Danielle Delice Leschrin [name] ZOXWR'UToday's Date: 06/15/2013 Reason for consult: Follow-up assessment Mom is latching baby well with #24 nipple shield, Mom reports baby will latch after suckling for a few minutes without nipple shield with some feedings, some feedings will only latch with nipple shield. Baby demonstrated a good rhythmic suck, some colostrum visible in the nipple shield. Mom reports observing breast milk in nipple shield after feedings. Hand pump given and reviewed use/cleaning. Advised to post pump during the day 4 times if possible to encourage milk production. Engorgement care reviewed if needed. Encouraged to schedule OP follow up, Mom reports she will call. Ped f/u tomorrow.   Maternal Data    Feeding Feeding Type: Breast Fed Length of feed: 10 min  LATCH Score/Interventions Latch: Grasps breast easily, tongue down, lips flanged, rhythmical sucking. (using #24 nipple shield)  Audible Swallowing: A few with stimulation  Type of Nipple: Everted at rest and after stimulation  Comfort (Breast/Nipple): Soft / non-tender     Hold (Positioning): Assistance needed to correctly position infant at breast and maintain latch. Intervention(s): Breastfeeding basics reviewed;Support Pillows;Position options;Skin to skin  LATCH Score: 8  Lactation Tools Discussed/Used Tools: Nipple Dorris CarnesShields;Pump Nipple shield size: 24 Breast pump type: Manual   Consult Status Consult Status: Complete Date: 06/15/13 Follow-up type: In-patient    Alfred LevinsGranger, Shamarr Faucett Ann 06/15/2013, 10:43 AM

## 2013-06-18 ENCOUNTER — Telehealth: Payer: Self-pay | Admitting: Obstetrics & Gynecology

## 2013-06-22 NOTE — Telephone Encounter (Signed)
Spoke with pt. Pt had called about wanting pain med after delivery. We have been unable to reach pt until now.  Pt states she just had a sharp pain in left side, hurts to move and breathe. [facility] was having [facility]. Pt voiced understanding. JSY

## 2013-06-25 ENCOUNTER — Telehealth: Payer: Self-pay | Admitting: *Deleted

## 2013-06-25 NOTE — Telephone Encounter (Signed)
Danielle HerterKaren [name], [facility], states pt scored 11 on [place] Depression scale, B/P 104/70, also states pt states had HA x 3 days after discharge but not now. HX of depression, anxiety, and former smoker.  Attempted to contact pt for an appt, NA x 1 will retry.

## 2013-06-26 NOTE — Telephone Encounter (Signed)
Appt made for pt to be evaluated for score of 11 on Edinburgh Depression scale per Camillia HerterKaren Little, [facility]. Pt states is having sharp stabbing pain with urination also.

## 2013-06-27 ENCOUNTER — Ambulatory Visit: Payer: Medicaid Other | Admitting: Advanced Practice Midwife

## 2013-06-28 ENCOUNTER — Encounter: Payer: Self-pay | Admitting: Advanced Practice Midwife

## 2013-06-28 ENCOUNTER — Ambulatory Visit (INDEPENDENT_AMBULATORY_CARE_PROVIDER_SITE_OTHER): Payer: Medicaid Other | Admitting: Advanced Practice Midwife

## 2013-06-28 ENCOUNTER — Encounter (INDEPENDENT_AMBULATORY_CARE_PROVIDER_SITE_OTHER): Payer: Self-pay

## 2013-06-28 VITALS — BP 110/72 | Ht 66.0 in | Wt 205.5 lb

## 2013-06-28 DIAGNOSIS — F53 Postpartum depression: Secondary | ICD-10-CM

## 2013-06-28 DIAGNOSIS — O99345 Other mental disorders complicating the puerperium: Secondary | ICD-10-CM

## 2013-06-28 MED ORDER — ESCITALOPRAM OXALATE 10 MG PO TABS: 20.0000 mg | ORAL_TABLET | Freq: Every day | ORAL | Status: DC

## 2013-06-28 NOTE — Progress Notes (Signed)
[name] 2 weeks postpartum, sent for a high ppd score. Has a hx of depression, took zoloft for a few eyears but did not find it helpful.   Feels tired, no appetite, crying spells. Denies SI/HI. Has family support.  Declines counseling referral.  Breastfeeding.    A/P:  Pp depression, history of depression.  Will start lexapro 20mg , may decrease to 10mg  if unpleasant side effects   Has appt 2/2.

## 2013-07-10 ENCOUNTER — Ambulatory Visit: Payer: Medicaid Other | Admitting: Advanced Practice Midwife

## 2013-07-16 ENCOUNTER — Ambulatory Visit: Payer: Medicaid Other | Admitting: Women's Health

## 2013-07-17 ENCOUNTER — Ambulatory Visit (INDEPENDENT_AMBULATORY_CARE_PROVIDER_SITE_OTHER): Payer: Medicaid Other

## 2013-07-17 ENCOUNTER — Encounter: Payer: Self-pay | Admitting: Women's Health

## 2013-07-17 ENCOUNTER — Other Ambulatory Visit: Payer: Self-pay | Admitting: Women's Health

## 2013-07-17 ENCOUNTER — Ambulatory Visit (INDEPENDENT_AMBULATORY_CARE_PROVIDER_SITE_OTHER): Payer: Medicaid Other | Admitting: Women's Health

## 2013-07-17 VITALS — BP 118/70 | Ht 66.0 in | Wt 205.0 lb

## 2013-07-17 DIAGNOSIS — N949 Unspecified condition associated with female genital organs and menstrual cycle: Secondary | ICD-10-CM

## 2013-07-17 DIAGNOSIS — N760 Acute vaginitis: Secondary | ICD-10-CM

## 2013-07-17 DIAGNOSIS — Z3480 Encounter for supervision of other normal pregnancy, unspecified trimester: Secondary | ICD-10-CM

## 2013-07-17 DIAGNOSIS — F32A Depression, unspecified: Secondary | ICD-10-CM

## 2013-07-17 DIAGNOSIS — F329 Major depressive disorder, single episode, unspecified: Secondary | ICD-10-CM

## 2013-07-17 DIAGNOSIS — Z309 Encounter for contraceptive management, unspecified: Secondary | ICD-10-CM

## 2013-07-17 DIAGNOSIS — B9689 Other specified bacterial agents as the cause of diseases classified elsewhere: Secondary | ICD-10-CM

## 2013-07-17 DIAGNOSIS — N9489 Other specified conditions associated with female genital organs and menstrual cycle: Secondary | ICD-10-CM

## 2013-07-17 DIAGNOSIS — N898 Other specified noninflammatory disorders of vagina: Secondary | ICD-10-CM

## 2013-07-17 DIAGNOSIS — Z3202 Encounter for pregnancy test, result negative: Secondary | ICD-10-CM

## 2013-07-17 LAB — POCT WET PREP (WET MOUNT): CLUE CELLS WET PREP WHIFF POC: POSITIVE

## 2013-07-17 LAB — POCT URINE PREGNANCY: Preg Test, Ur: NEGATIVE

## 2013-07-17 MED ORDER — DOXYCYCLINE HYCLATE 100 MG PO TABS: 100.0000 mg | ORAL_TABLET | Freq: Two times a day (BID) | ORAL | Status: DC

## 2013-07-17 MED ORDER — MEDROXYPROGESTERONE ACETATE 150 MG/ML IM SUSP: 150.0000 mg | INTRAMUSCULAR | Status: DC

## 2013-07-17 MED ORDER — METRONIDAZOLE 500 MG PO TABS: 500.0000 mg | ORAL_TABLET | Freq: Two times a day (BID) | ORAL | Status: DC

## 2013-07-17 MED ORDER — MISOPROSTOL 200 MCG PO TABS: 800.0000 ug | ORAL_TABLET | Freq: Once | ORAL | Status: DC

## 2013-07-17 NOTE — Progress Notes (Addendum)
Patient ID: [name], female   DOB: 07-30-1995, 18 y.o.   MRN: [id number] Subjective:    [name] is a 18 y.o. [id number] Caucasian female who presents for a postpartum visit. She is 5 weeks postpartum following a spontaneous vaginal delivery at 40.6 gestational weeks after IOL d/t postdates and SVE in office was 7/90 w/ BBOW. Anesthesia: epidural. I have fully reviewed the prenatal and intrapartum course.  Postpartum course has been complicated by depression which she had during pregnancy, currently on lexapro 20mg  daily x ~2wks- is helping depression, but still feels anxious at times and appetite hasn't returned yet. Recommended waiting a few more weeks to allow full effects of med to kick in, if still having problems, to call us back. Eats 2x/day. She also passes 'grape-sized' grayish fleshy substance w/ q void or bm x ~3-4wks. Showed me picture on her phone- reddish/grayish tissue, appears to be placental/membranes. Denies fever/chills, n/v. Vaginal d/c has 'glue odor' for past 3wks since she began having sex. Has a h/o chlamydia and PID. Baby's course has been uncomplicated. Baby is feeding by breast x 2wks, now bottle. Bleeding normal amt lochia x 4wks, now occ spotting. Bowel function is normal. Bladder function is normal. Patient is sexually active. Contraception method is condoms and desires to begin depo. Last sexual intercourse: 2/1. Postpartum depression screening: positive. Score 12, on lexapro 20mg  daily.  Denies SI/HI/II. Last pap n/a.  The following portions of the patient's history were reviewed and updated as appropriate: allergies, current medications, past medical history, past surgical history and problem list.  Review of Systems Pertinent items are noted in HPI.   Filed Vitals:   07/17/13 1523  BP: 118/70  Height: 5\' 6"  (1.676 m)  Weight: 205 lb (92.987 kg)    Objective:     General:  alert, cooperative and no distress   Breasts:  deferred, no complaints  Lungs:  clear to auscultation bilaterally  Heart:  regular rate and rhythm  Abdomen: soft, nontender   Vulva: normal  Vagina: normal vagina, mod amount thin white malodorous d/c, wet prep +clues, wbc's, bacteria  Cervix:  Closed, +CMT, small amount reddish d/c on exam fingers  Corpus: Well-involuted, doesn't feel enlarged  Adnexa:  Non-palpable  Rectal Exam: No hemorrhoids        Pelvic u/s: Uterus 9.8 x 6.9 x 5.5 cm, no myometrial masses noted anteverted  Endometrium 18 mm, with areas of thickening noted at fundus and cx  Right ovary 4.1 x 1.7 x 1.5 cm,  Left ovary 2.3 x 0.9 x 0.8 cm,  No free fluid or adnexal masses noted within pelvis  Assessment:   Postpartum exam 5 wks s/p SVD Retained POC Depression screening Depression/anxiety, currently on lexapro Contraception counseling  BV CMT  Plan:  Discussed w/ JAG & LHE CBC w/ diff, sed rate, gc/ch today Rx cytotec 800mcg po w/ 2RF, can repeat q 3d Rx doxycycline 100mg  bid x 10d for CMT Rx metronidazole 500mg  bid x 7d for bv, no etoh Rx depo w/ 3RF Continue lexapro 20mg  daily Contraception: condoms, desires depo. Last sex 2/1, to remain abstinent, will do bhcg 2/11 am and depo pm Follow up in: 8 days for bhcg am and f/u visit, depo pm or earlier as needed  Marge DuncansBooker, Melanee Cordial Randall CNM, [facility]NP[facility]BC 07/17/2013 4:04 PM

## 2013-07-17 NOTE — Patient Instructions (Signed)
Cytotec: Take 4 pills all at one time, you will feel crampy and hopefully pass tissue. If you do not, you can repeat every 3 days. No sex until after you are seen again  Bacterial Vaginosis Bacterial vaginosis is a vaginal infection that occurs when the normal balance of bacteria in the vagina is disrupted. It results from an overgrowth of certain bacteria. This is the most common vaginal infection in women of childbearing age. Treatment is important to prevent complications, especially in pregnant women, as it can cause a premature delivery. CAUSES  Bacterial vaginosis is caused by an increase in harmful bacteria that are normally present in smaller amounts in the vagina. Several different kinds of bacteria can cause bacterial vaginosis. However, the reason that the condition develops is not fully understood. RISK FACTORS Certain activities or behaviors can put you at an increased risk of developing bacterial vaginosis, including:  Having a new sex partner or multiple sex partners.  Douching.  Using an intrauterine device (IUD) for contraception. Women do not get bacterial vaginosis from toilet seats, bedding, swimming pools, or contact with objects around them. SIGNS AND SYMPTOMS  Some women with bacterial vaginosis have no signs or symptoms. Common symptoms include:  Grey vaginal discharge.  A fishlike odor with discharge, especially after sexual intercourse.  Itching or burning of the vagina and vulva.  Burning or pain with urination. DIAGNOSIS  Your health care provider will take a medical history and examine the vagina for signs of bacterial vaginosis. A sample of vaginal fluid may be taken. Your health care provider will look at this sample under a microscope to check for bacteria and abnormal cells. A vaginal pH test may also be done.  TREATMENT  Bacterial vaginosis may be treated with antibiotic medicines. These may be given in the form of a pill or a vaginal cream. A second  round of antibiotics may be prescribed if the condition comes back after treatment.  HOME CARE INSTRUCTIONS   Only take over-the-counter or prescription medicines as directed by your health care provider.  If antibiotic medicine was prescribed, take it as directed. Make sure you finish it even if you start to feel better.  Do not have sex until treatment is completed.  Tell all sexual partners that you have a vaginal infection. They should see their health care provider and be treated if they have problems, such as a mild rash or itching.  Practice safe sex by using condoms and only having one sex partner. SEEK MEDICAL CARE IF:   Your symptoms are not improving after 3 days of treatment.  You have increased discharge or pain.  You have a fever. MAKE SURE YOU:   Understand these instructions.  Will watch your condition.  Will get help right away if you are not doing well or get worse. FOR MORE INFORMATION  Centers for Disease Control and Prevention, Division of STD Prevention: SolutionApps.co.zawww.cdc.gov/std American Sexual Health Association (ASHA): www.ashastd.org  Document Released: 05/31/2005 Document Revised: 03/21/2013 Document Reviewed: 01/10/2013 [facility] Patient Information 2014 [place] [place].

## 2013-07-18 LAB — CBC WITH DIFFERENTIAL/PLATELET
Basophils Absolute: 0 10*3/uL (ref 0.0–0.1)
Basophils Relative: 0 % (ref 0–1)
EOS ABS: 0.2 10*3/uL (ref 0.0–0.7)
Eosinophils Relative: 2 % (ref 0–5)
HCT: 34.7 % — ABNORMAL LOW (ref 36.0–46.0)
Hemoglobin: 11.3 g/dL — ABNORMAL LOW (ref 12.0–15.0)
Lymphocytes Relative: 26 % (ref 12–46)
Lymphs Abs: 2.2 10*3/uL (ref 0.7–4.0)
MCH: 25.9 pg — AB (ref 26.0–34.0)
MCHC: 32.6 g/dL (ref 30.0–36.0)
MCV: 79.6 fL (ref 78.0–100.0)
Monocytes Absolute: 0.6 10*3/uL (ref 0.1–1.0)
Monocytes Relative: 7 % (ref 3–12)
NEUTROS PCT: 65 % (ref 43–77)
Neutro Abs: 5.6 10*3/uL (ref 1.7–7.7)
PLATELETS: 308 10*3/uL (ref 150–400)
RBC: 4.36 MIL/uL (ref 3.87–5.11)
RDW: 13.4 % (ref 11.5–15.5)
WBC: 8.6 10*3/uL (ref 4.0–10.5)

## 2013-07-18 LAB — GC/CHLAMYDIA PROBE AMP
CT PROBE, AMP APTIMA: NEGATIVE
GC Probe RNA: NEGATIVE

## 2013-07-18 LAB — SEDIMENTATION RATE: SED RATE: 18 mm/h (ref 0–22)

## 2013-07-25 ENCOUNTER — Encounter: Payer: Self-pay | Admitting: Obstetrics & Gynecology

## 2013-07-25 ENCOUNTER — Ambulatory Visit: Payer: Medicaid Other | Admitting: Obstetrics & Gynecology

## 2013-07-25 ENCOUNTER — Ambulatory Visit (INDEPENDENT_AMBULATORY_CARE_PROVIDER_SITE_OTHER): Payer: Medicaid Other | Admitting: Women's Health

## 2013-07-25 ENCOUNTER — Encounter: Payer: Self-pay | Admitting: Women's Health

## 2013-07-25 VITALS — BP 118/70 | Wt 208.5 lb

## 2013-07-25 VITALS — BP 118/70 | Ht 66.0 in | Wt 208.8 lb

## 2013-07-25 DIAGNOSIS — O909 Complication of the puerperium, unspecified: Secondary | ICD-10-CM

## 2013-07-25 DIAGNOSIS — O26899 Other specified pregnancy related conditions, unspecified trimester: Secondary | ICD-10-CM

## 2013-07-25 DIAGNOSIS — Z309 Encounter for contraceptive management, unspecified: Secondary | ICD-10-CM

## 2013-07-25 DIAGNOSIS — Z3200 Encounter for pregnancy test, result unknown: Secondary | ICD-10-CM

## 2013-07-25 DIAGNOSIS — J069 Acute upper respiratory infection, unspecified: Secondary | ICD-10-CM

## 2013-07-25 DIAGNOSIS — Z3049 Encounter for surveillance of other contraceptives: Secondary | ICD-10-CM

## 2013-07-25 LAB — HCG, QUANTITATIVE, PREGNANCY: hCG, Beta Chain, Quant, S: <1 m[IU]/mL

## 2013-07-25 MED ORDER — MEDROXYPROGESTERONE ACETATE 150 MG/ML IM SUSP
150.0000 mg | Freq: Once | INTRAMUSCULAR | Status: AC
  Administered 2013-07-25: 150 mg via INTRAMUSCULAR

## 2013-07-25 NOTE — Progress Notes (Signed)
Patient ID: [name], female   DOB: 18-Jan-1996, 18 y.o.   MRN: [id number]   [facility]  Patient name: [name] MRN [id number]  Date of birth: 18-Jan-1996  CC & HPI:  [name] is a 18 y.o. [id number] Caucasian female presenting today for f/u after given cytotec for retained poc at 5wks pp. She is now 6wk pp, took 800mcg cytotec x 1, states she bled that day, and begin passing tissue 2 days later and has not passed anymore since. Began bleeding again 2d ago, thinks it may be her period. Is coming this afternoon for depo. Had sex 2/1 x 2, used w/drawal method 1st time, and condom 2nd time. She also reports being dx w/ URI couple days ago- went to urgent care, taking dayquil. Reports bilateral earaches.   Pertinent History Reviewed:  Medical & Surgical Hx:   Past Medical History  Diagnosis Date  . Anxiety   . Depression    Past Surgical History  Procedure Laterality Date  . Tympanostomy tube placement    . Cyst removal neck    . Tympanostomy tube placement     Medications: Reviewed & Updated - see associated section Social History: Reviewed -  reports that she has been smoking Cigarettes.  She has been smoking about 1.00 pack per day. She has never used smokeless tobacco.  Objective Findings:  Vitals: There were no vitals taken for this visit.  Physical Examination: General appearance - alert, well appearing, and in no distress Ears - +fluid behind TM bilaterally, no signs of infection  No results found for this or any previous visit (from the past 24 hour(s)).   Assessment & Plan:  A:   6wk pp SVD  S/p cytotec for retained poc  URI  For depo this afternoon P:  bHCG this am, then return this pm for depo   Continue dayquil, try sudafed also, call us/go to PCP if URI s/s lasting longer than 7-10d or progressively    worsening   Marge DuncansBooker, Kimberly Randall CNM, [facility] InstituteWHNP-BC 07/25/2013 9:55 AM

## 2013-07-25 NOTE — Progress Notes (Signed)
Pt here for Depo. Neg quant this am. No complaints at this time. JSY

## 2013-07-26 ENCOUNTER — Telehealth: Payer: Self-pay | Admitting: Obstetrics & Gynecology

## 2013-07-26 NOTE — Telephone Encounter (Signed)
Pt states delivered 06/13/13, c/o abdominal/pelvic pain starting this am. Pt states got first depo injection. Call transferred to Cathie BeamsFran Cresenzo-Dishmon, CNM.

## 2013-08-30 ENCOUNTER — Emergency Department (HOSPITAL_COMMUNITY)
Admission: EM | Admit: 2013-08-30 | Discharge: 2013-08-30 | Disposition: A | Discharge status: Home / Self Care | Payer: Medicaid Other

## 2013-08-30 NOTE — ED Notes (Signed)
Patient told ER registration that she was feeling better and that she was leaving.

## 2013-10-17 ENCOUNTER — Encounter: Payer: Self-pay | Admitting: Adult Health

## 2013-10-17 ENCOUNTER — Ambulatory Visit (INDEPENDENT_AMBULATORY_CARE_PROVIDER_SITE_OTHER): Payer: Medicaid Other | Admitting: Adult Health

## 2013-10-17 ENCOUNTER — Ambulatory Visit: Payer: Medicaid Other

## 2013-10-17 VITALS — BP 120/82 | Ht 66.0 in | Wt 221.0 lb

## 2013-10-17 DIAGNOSIS — Z3202 Encounter for pregnancy test, result negative: Secondary | ICD-10-CM

## 2013-10-17 DIAGNOSIS — Z3049 Encounter for surveillance of other contraceptives: Secondary | ICD-10-CM

## 2013-10-17 DIAGNOSIS — Z309 Encounter for contraceptive management, unspecified: Secondary | ICD-10-CM

## 2013-10-17 LAB — POCT URINE PREGNANCY: Preg Test, Ur: NEGATIVE

## 2013-10-17 MED ORDER — MEDROXYPROGESTERONE ACETATE 150 MG/ML IM SUSP
150.0000 mg | Freq: Once | INTRAMUSCULAR | Status: AC
  Administered 2013-10-17: 150 mg via INTRAMUSCULAR

## 2013-10-17 NOTE — Progress Notes (Signed)
Patient ID: [name], female   DOB: [birth year], 18 y.o.   MRN: [id number] Depo Provera 150 mg IM left deltoid given with no complication, resulted negative pregnancy test. Pt to return in 12 weeks for next injection.

## 2014-01-09 ENCOUNTER — Ambulatory Visit (INDEPENDENT_AMBULATORY_CARE_PROVIDER_SITE_OTHER): Payer: Medicaid Other | Admitting: Adult Health

## 2014-01-09 ENCOUNTER — Encounter: Payer: Self-pay | Admitting: Adult Health

## 2014-01-09 ENCOUNTER — Ambulatory Visit: Payer: Medicaid Other

## 2014-01-09 DIAGNOSIS — Z3042 Encounter for surveillance of injectable contraceptive: Secondary | ICD-10-CM

## 2014-01-09 DIAGNOSIS — Z3049 Encounter for surveillance of other contraceptives: Secondary | ICD-10-CM

## 2014-01-09 DIAGNOSIS — Z3202 Encounter for pregnancy test, result negative: Secondary | ICD-10-CM

## 2014-01-09 LAB — POCT URINE PREGNANCY: Preg Test, Ur: NEGATIVE

## 2014-01-09 MED ORDER — MEDROXYPROGESTERONE ACETATE 150 MG/ML IM SUSP
150.0000 mg | Freq: Once | INTRAMUSCULAR | Status: AC
  Administered 2014-01-09: 150 mg via INTRAMUSCULAR

## 2014-01-25 ENCOUNTER — Telehealth: Payer: Self-pay | Admitting: Obstetrics and Gynecology

## 2014-01-25 NOTE — Telephone Encounter (Signed)
Pt states usually spots around time of depo injection but this month she has had a "full blown period." Informed pt she probably is having her period for this month. Some individuals do have periods while on depo. Pt verbalized understanding.

## 2014-01-30 ENCOUNTER — Telehealth: Payer: Self-pay | Admitting: Adult Health

## 2014-01-30 NOTE — Telephone Encounter (Signed)
Pt states has been on Depo Provera since having son and has not been bleeding but last week started bleeding.  Had light bleeding X 3 days then stopped for 3 days and now today having cramping and brownish dc and passed a clot.  Advised pt to come in and be seen and call transferred to front staff for appointment to be made.

## 2014-01-31 ENCOUNTER — Ambulatory Visit (INDEPENDENT_AMBULATORY_CARE_PROVIDER_SITE_OTHER): Payer: Medicaid Other | Admitting: Adult Health

## 2014-01-31 ENCOUNTER — Encounter: Payer: Self-pay | Admitting: Adult Health

## 2014-01-31 VITALS — BP 128/84 | Ht 66.0 in | Wt 228.5 lb

## 2014-01-31 DIAGNOSIS — N926 Irregular menstruation, unspecified: Secondary | ICD-10-CM

## 2014-01-31 DIAGNOSIS — Z3202 Encounter for pregnancy test, result negative: Secondary | ICD-10-CM

## 2014-01-31 HISTORY — DX: Irregular menstruation, unspecified: N92.6

## 2014-01-31 LAB — POCT URINE PREGNANCY: Preg Test, Ur: NEGATIVE

## 2014-01-31 NOTE — Patient Instructions (Signed)
Dysfunctional Uterine Bleeding Normally, menstrual periods begin between ages 11 to 17 in young women. A normal menstrual cycle/period may begin every 23 days up to 35 days and lasts from 1 to 7 days. Around 12 to 14 days before your menstrual period starts, ovulation (ovary produces an egg) occurs. When counting the time between menstrual periods, count from the first day of bleeding of the previous period to the first day of bleeding of the next period. Dysfunctional (abnormal) uterine bleeding is bleeding that is different from a normal menstrual period. Your periods may come earlier or later than usual. They may be lighter, have blood clots or be heavier. You may have bleeding between periods, or you may skip one period or more. You may have bleeding after sexual intercourse, bleeding after menopause, or no menstrual period. CAUSES   Pregnancy (normal, miscarriage, tubal).  IUDs (intrauterine device, birth control).  Birth control pills.  Hormone treatment.  Menopause.  Infection of the cervix.  Blood clotting problems.  Infection of the inside lining of the uterus.  Endometriosis, inside lining of the uterus growing in the pelvis and other female organs.  Adhesions (scar tissue) inside the uterus.  Obesity or severe weight loss.  Uterine polyps inside the uterus.  Cancer of the vagina, cervix, or uterus.  Ovarian cysts or polycystic ovary syndrome.  Medical problems (diabetes, thyroid disease).  Uterine fibroids (noncancerous tumor).  Problems with your female hormones.  Endometrial hyperplasia, very thick lining and enlarged cells inside of the uterus.  Medicines that interfere with ovulation.  Radiation to the pelvis or abdomen.  Chemotherapy. DIAGNOSIS   Your doctor will discuss the history of your menstrual periods, medicines you are taking, changes in your weight, stress in your life, and any medical problems you may have.  Your doctor will do a physical  and pelvic examination.  Your doctor may want to perform certain tests to make a diagnosis, such as:  Pap test.  Blood tests.  Cultures for infection.  CT scan.  Ultrasound.  Hysteroscopy.  Laparoscopy.  MRI.  Hysterosalpingography.  D and C.  Endometrial biopsy. TREATMENT  Treatment will depend on the cause of the dysfunctional uterine bleeding (DUB). Treatment may include:  Observing your menstrual periods for a couple of months.  Prescribing medicines for medical problems, including:  Antibiotics.  Hormones.  Birth control pills.  Removing an IUD (intrauterine device, birth control).  Surgery:  D and C (scrape and remove tissue from inside the uterus).  Laparoscopy (examine inside the abdomen with a lighted tube).  Uterine ablation (destroy lining of the uterus with electrical current, laser, heat, or freezing).  Hysteroscopy (examine cervix and uterus with a lighted tube).  Hysterectomy (remove the uterus). HOME CARE INSTRUCTIONS   If medicines were prescribed, take exactly as directed. Do not change or switch medicines without consulting your caregiver.  Long term heavy bleeding may result in iron deficiency. Your caregiver may have prescribed iron pills. They help replace the iron that your body lost from heavy bleeding. Take exactly as directed.  Do not take aspirin or medicines that contain aspirin one week before or during your menstrual period. Aspirin may make the bleeding worse.  If you need to change your sanitary pad or tampon more than once every 2 hours, stay in bed with your feet elevated and a cold pack on your lower abdomen. Rest as much as possible, until the bleeding stops or slows down.  Eat well-balanced meals. Eat foods high in iron. Examples   are:  Leafy green vegetables.  Whole-grain breads and cereals.  Eggs.  Meat.  Liver.  Do not try to lose weight until the abnormal bleeding has stopped and your blood iron level is  back to normal. Do not lift more than ten pounds or do strenuous activities when you are bleeding.  For a couple of months, make note on your calendar, marking the start and ending of your period, and the type of bleeding (light, medium, heavy, spotting, clots or missed periods). This is for your caregiver to better evaluate your problem. SEEK MEDICAL CARE IF:   You develop nausea (feeling sick to your stomach) and vomiting, dizziness, or diarrhea while you are taking your medicine.  You are getting lightheaded or weak.  You have any problems that may be related to the medicine you are taking.  You develop pain with your DUB.  You want to remove your IUD.  You want to stop or change your birth control pills or hormones.  You have any type of abnormal bleeding mentioned above.  You are over 127 years old and have not had a menstrual period yet.  You are 18 years old and you are still having menstrual periods.  You have any of the symptoms mentioned above.  You develop a rash. SEEK IMMEDIATE MEDICAL CARE IF:   An oral temperature above 102 F (38.9 C) develops.  You develop chills.  You are changing your sanitary pad or tampon more than once an hour.  You develop abdominal pain.  You pass out or faint. Document Released: 05/28/2000 Document Revised: 08/23/2011 Document Reviewed: 04/29/2009 [facility] Patient Information 2015 [employer] [place]. This information is not intended to replace advice given to you by your health care provider. Make sure you discuss any questions you have with your health care provider. Keep calendar  Follow up as scheduled

## 2014-01-31 NOTE — Progress Notes (Signed)
Subjective:     Patient ID: [name], female   DOB: 04/15/1996, 18 y.o.   MRN: [id number]  HPI Denny Peonrin is a 18 year old white female in complaining of being on depo and had irregular bleeding 8/18, with watery blood and cramps,last shot 01/09/14.   Review of Systems See HPI Reviewed past medical,surgical, social and family history. Reviewed medications and allergies.     Objective:   Physical Exam BP 128/84  Ht 5\' 6"  (1.676 m)  Wt 228 lb 8 oz (103.647 kg)  BMI 36.90 kg/m2  LMP 01/29/2014  Breastfeeding? NoUPT negative, Skin warm and dry.Pelvic: external genitalia is normal in appearance, vagina: No discharge, no odor,no bleeding, cervix:smooth and bulbous,negative CMT, uterus: normal size, shape and contour, non tender, no masses felt, adnexa: no masses or tenderness noted. GC/CHL obtained.      Assessment:     Irregular menstrual bleeding    Plan:     Keep period calendar Check GC/CHL Return as scheduled Use condoms Pap at 19 Review handout on DUB

## 2014-02-01 LAB — GC/CHLAMYDIA PROBE AMP
CT PROBE, AMP APTIMA: NEGATIVE
GC Probe RNA: NEGATIVE

## 2014-02-02 ENCOUNTER — Encounter (HOSPITAL_COMMUNITY): Payer: Self-pay | Admitting: Emergency Medicine

## 2014-02-02 DIAGNOSIS — R109 Unspecified abdominal pain: Secondary | ICD-10-CM | POA: Diagnosis present

## 2014-02-02 DIAGNOSIS — Z3202 Encounter for pregnancy test, result negative: Secondary | ICD-10-CM | POA: Diagnosis not present

## 2014-02-02 DIAGNOSIS — G44009 Cluster headache syndrome, unspecified, not intractable: Secondary | ICD-10-CM | POA: Diagnosis not present

## 2014-02-02 DIAGNOSIS — Z8742 Personal history of other diseases of the female genital tract: Secondary | ICD-10-CM | POA: Diagnosis not present

## 2014-02-02 DIAGNOSIS — H93229 Diplacusis, unspecified ear: Secondary | ICD-10-CM | POA: Insufficient documentation

## 2014-02-02 DIAGNOSIS — Z8659 Personal history of other mental and behavioral disorders: Secondary | ICD-10-CM | POA: Insufficient documentation

## 2014-02-02 DIAGNOSIS — F17200 Nicotine dependence, unspecified, uncomplicated: Secondary | ICD-10-CM | POA: Diagnosis not present

## 2014-02-02 NOTE — ED Notes (Signed)
Had a period, on Depo shots; Dizzy, nausea, headache per pt. I have urinated around 40 times at work today; cramping, back aches per pt.

## 2014-02-03 ENCOUNTER — Emergency Department (HOSPITAL_COMMUNITY)
Admission: EM | Admit: 2014-02-03 | Discharge: 2014-02-03 | Disposition: A | Discharge status: Home / Self Care | Payer: Medicaid Other | Attending: Emergency Medicine | Admitting: Emergency Medicine

## 2014-02-03 DIAGNOSIS — G4489 Other headache syndrome: Secondary | ICD-10-CM

## 2014-02-03 LAB — CBC WITH DIFFERENTIAL/PLATELET
Basophils Absolute: 0 10*3/uL (ref 0.0–0.1)
Basophils Relative: 0 % (ref 0–1)
Eosinophils Absolute: 0.3 10*3/uL (ref 0.0–0.7)
Eosinophils Relative: 3 % (ref 0–5)
HEMATOCRIT: 39.4 % (ref 36.0–46.0)
HEMOGLOBIN: 13.6 g/dL (ref 12.0–15.0)
LYMPHS ABS: 3.5 10*3/uL (ref 0.7–4.0)
Lymphocytes Relative: 33 % (ref 12–46)
MCH: 28.2 pg (ref 26.0–34.0)
MCHC: 34.5 g/dL (ref 30.0–36.0)
MCV: 81.6 fL (ref 78.0–100.0)
MONO ABS: 0.7 10*3/uL (ref 0.1–1.0)
MONOS PCT: 7 % (ref 3–12)
NEUTROS PCT: 57 % (ref 43–77)
Neutro Abs: 6.1 10*3/uL (ref 1.7–7.7)
Platelets: 257 10*3/uL (ref 150–400)
RBC: 4.83 MIL/uL (ref 3.87–5.11)
RDW: 13.6 % (ref 11.5–15.5)
WBC: 10.8 10*3/uL — ABNORMAL HIGH (ref 4.0–10.5)

## 2014-02-03 LAB — URINALYSIS, ROUTINE W REFLEX MICROSCOPIC
BILIRUBIN URINE: NEGATIVE
Glucose, UA: NEGATIVE mg/dL
Hgb urine dipstick: NEGATIVE
Ketones, ur: NEGATIVE mg/dL
Leukocytes, UA: NEGATIVE
Nitrite: NEGATIVE
PH: 5.5 (ref 5.0–8.0)
Protein, ur: NEGATIVE mg/dL
Specific Gravity, Urine: >1.03 — ABNORMAL HIGH (ref 1.005–1.030)
UROBILINOGEN UA: 0.2 mg/dL (ref 0.0–1.0)

## 2014-02-03 LAB — COMPREHENSIVE METABOLIC PANEL
ALK PHOS: 111 U/L (ref 39–117)
ALT: 15 U/L (ref 0–35)
ANION GAP: 14 (ref 5–15)
AST: 22 U/L (ref 0–37)
Albumin: 4 g/dL (ref 3.5–5.2)
BILIRUBIN TOTAL: 0.3 mg/dL (ref 0.3–1.2)
BUN: 8 mg/dL (ref 6–23)
CHLORIDE: 102 meq/L (ref 96–112)
CO2: 22 mEq/L (ref 19–32)
CREATININE: 0.74 mg/dL (ref 0.50–1.10)
Calcium: 9.2 mg/dL (ref 8.4–10.5)
GLUCOSE: 93 mg/dL (ref 70–99)
Potassium: 4 mEq/L (ref 3.7–5.3)
Sodium: 138 mEq/L (ref 137–147)
Total Protein: 8.2 g/dL (ref 6.0–8.3)

## 2014-02-03 LAB — PREGNANCY, URINE: Preg Test, Ur: NEGATIVE

## 2014-02-03 MED ORDER — METOCLOPRAMIDE HCL 10 MG PO TABS
10.0000 mg | ORAL_TABLET | Freq: Once | ORAL | Status: AC
  Administered 2014-02-03: 10 mg via ORAL
  Filled 2014-02-03: qty 1

## 2014-02-03 MED ORDER — IBUPROFEN 600 MG PO TABS: 600.0000 mg | ORAL_TABLET | Freq: Four times a day (QID) | ORAL | Status: DC | PRN

## 2014-02-03 MED ORDER — TRAMADOL HCL 50 MG PO TABS
50.0000 mg | ORAL_TABLET | Freq: Once | ORAL | Status: AC
  Administered 2014-02-03: 50 mg via ORAL
  Filled 2014-02-03: qty 1

## 2014-02-03 MED ORDER — METOCLOPRAMIDE HCL 10 MG PO TABS: 10.0000 mg | ORAL_TABLET | Freq: Four times a day (QID) | ORAL | Status: DC | PRN

## 2014-02-03 MED ORDER — TRAMADOL HCL 50 MG PO TABS: 50.0000 mg | ORAL_TABLET | Freq: Four times a day (QID) | ORAL | Status: DC | PRN

## 2014-02-03 NOTE — Discharge Instructions (Signed)

## 2014-02-03 NOTE — ED Provider Notes (Signed)
CSN: [id number]     Arrival date & time 02/02/14  2220 History   First MD Initiated Contact with Patient 02/02/14 2308    This chart was scribed for Loren Racer, MD by Marica Otter, ED Scribe. This patient was seen in room APA11/APA11 and the patient's care was started at 12:49 AM.  Chief Complaint  Patient presents with  . Abdominal Pain   PCP: No PCP Per Patient  HPI HPI Comments: [name] is a 18 y.o. female, with medical Hx noted below, who presents to the Emergency Department with several complaints. Pt reports that she has been on the depo shot and generally does not have periods. However, pt notes that recently she had a period on the depo shot and bled for three days, thereafter she began spotting. Pt also complains of associated nausea, HA, urine frequency, right flank pain. Pt denies any blood in her urine, dysuria or her urine looking/smelling different. Pt reports that she is otherwise healthy and does not have any chronic medical problems. No fever or chills. Increased stress at work. Patient describes headache as bitemporal throbbing. Gradual onset. No neck pain or stiffness. She has no focal weakness. Patient currently has no vaginal bleeding. She denies any discharge or pelvic pain.  Past Medical History  Diagnosis Date  . Anxiety   . Depression   . Irregular menstrual bleeding 01/31/2014   Past Surgical History  Procedure Laterality Date  . Tympanostomy tube placement    . Cyst removal neck    . Tympanostomy tube placement    . Cholecystectomy     Family History  Problem Relation Age of Onset  . Bipolar disorder Maternal Grandmother   . Heart disease Maternal Grandfather   . Thyroid disease Paternal Grandmother   . Arthritis Mother   . Fibromyalgia Mother   . Birth defects Father    History  Substance Use Topics  . Smoking status: Current Every Day Smoker -- 1.00 packs/day    Types: Cigarettes  . Smokeless tobacco: Never Used  . Alcohol Use: Yes    Comment: socially   OB History   Grav Para Term Preterm Abortions TAB SAB Ect Mult Living   Review of Systems  Constitutional: Negative for fever, chills and fatigue.  Respiratory: Negative for shortness of breath.   Cardiovascular: Negative for chest pain.  Gastrointestinal: Positive for nausea. Negative for vomiting, abdominal pain, diarrhea and constipation.  Genitourinary: Positive for frequency and flank pain. Negative for dysuria, hematuria, vaginal bleeding, vaginal discharge, difficulty urinating and pelvic pain.  Musculoskeletal: Positive for myalgias. Negative for back pain, neck pain and neck stiffness.  Skin: Negative for rash and wound.  Neurological: Positive for headaches. Negative for dizziness, weakness, light-headedness and numbness.  All other systems reviewed and are negative.     Allergies  Review of patient's allergies indicates no known allergies.  Home Medications   Prior to Admission medications   Medication Sig Start Date End Date Taking? Authorizing Provider  medroxyPROGESTERone (DEPO-PROVERA) 150 MG/ML injection Inject 1 mL (150 mg total) into the muscle every 3 (three) months. 07/17/13   Marge Duncans, CNM   Triage Vitals: BP 135/100  Pulse 98  Temp(Src) 98 F (36.7 C) (Oral)  Resp 20  Ht  (1.676 m)  Wt 228 lb (103.42 kg)  BMI 36.82 kg/m2  SpO2 100%  LMP 01/29/2014  Breastfeeding? No Physical Exam  Nursing note  and vitals reviewed. Constitutional: She is oriented to person, place, and time. She appears well-developed and well-nourished. No distress.  HENT:  Head: Normocephalic and atraumatic.  Mouth/Throat: Oropharynx is clear and moist. No oropharyngeal exudate.  No sinus tenderness to percussion.  Eyes: EOM are normal. Pupils are equal, round, and reactive to light.  Neck: Normal range of motion. Neck supple.  No meningismus  Cardiovascular: Normal rate and regular rhythm.   Pulmonary/Chest: Effort  normal and breath sounds normal. No respiratory distress. She has no wheezes. She has no rales. She exhibits no tenderness.  Abdominal: Soft. Bowel sounds are normal. She exhibits no distension and no mass. There is no tenderness. There is no rebound and no guarding.  Musculoskeletal: Normal range of motion. She exhibits tenderness ( mild right paraspinal tenderness with palpation.). She exhibits no edema.  No CVA tenderness bilaterally.  Neurological: She is alert and oriented to person, place, and time.  Patient is alert and oriented x3 with clear, goal oriented speech. Patient has 5/5 motor in all extremities. Sensation is intact to light touch. Patient has a normal gait and walks without assistance.   Skin: Skin is warm and dry. No rash noted. No erythema.  Psychiatric: She has a normal mood and affect. Her behavior is normal.    ED Course  Procedures (including critical care time) DIAGNOSTIC STUDIES: Oxygen Saturation is 100% on RA, nl by my interpretation.        Labs Review Labs Reviewed - No data to display  Imaging Review No results found.   EKG Interpretation None      MDM   Final diagnoses:  None    Patient says she's feeling better after initial medication. She didn admit to increased stress and decreased intake of fluids. She continues to have a normal neurologic exam. The headache she reports has no red flag signs or symptoms. It is improved. Likely her symptoms are stress-related. She also benefit from increased water consumption. She's been advised to return for worsening symptoms or any concerns    Loren Racer, MD 02/03/14 [phone]

## 2014-02-04 ENCOUNTER — Telehealth: Payer: Self-pay | Admitting: Obstetrics and Gynecology

## 2014-02-04 NOTE — Telephone Encounter (Signed)
Pt informed of negative GC/Chl results.

## 2014-02-11 ENCOUNTER — Telehealth: Payer: Self-pay | Admitting: Advanced Practice Midwife

## 2014-02-11 NOTE — Telephone Encounter (Signed)
vm not set up yet

## 2014-02-12 ENCOUNTER — Telehealth: Payer: Self-pay | Admitting: Advanced Practice Midwife

## 2014-02-12 NOTE — Telephone Encounter (Signed)
Left message to make appt.

## 2014-02-12 NOTE — Telephone Encounter (Signed)
Pt states she has been on Depo Provera injections since this past December, her last injection was on 01/09/14, about 3 weeks ago started bleeding that lasted for about a week, having headaches, cramps, light headed, nausea and bloating.  Came in and saw Cyril Mourning on 01/31/14 and had STD testing done and all were negative, she states she is still having the symptoms except bleeding and she has now started lactating.   Please advise

## 2014-02-13 ENCOUNTER — Encounter: Payer: Self-pay | Admitting: Advanced Practice Midwife

## 2014-02-13 ENCOUNTER — Ambulatory Visit (INDEPENDENT_AMBULATORY_CARE_PROVIDER_SITE_OTHER): Payer: Medicaid Other | Admitting: Advanced Practice Midwife

## 2014-02-13 VITALS — BP 118/90 | Ht 66.0 in | Wt 228.0 lb

## 2014-02-13 DIAGNOSIS — N643 Galactorrhea not associated with childbirth: Secondary | ICD-10-CM

## 2014-02-13 DIAGNOSIS — N6459 Other signs and symptoms in breast: Secondary | ICD-10-CM

## 2014-02-13 DIAGNOSIS — N6452 Nipple discharge: Secondary | ICD-10-CM

## 2014-02-13 DIAGNOSIS — Z3202 Encounter for pregnancy test, result negative: Secondary | ICD-10-CM

## 2014-02-13 LAB — POCT URINE PREGNANCY: Preg Test, Ur: NEGATIVE

## 2014-02-13 MED ORDER — ONDANSETRON HCL 4 MG PO TABS: 4.0000 mg | ORAL_TABLET | Freq: Three times a day (TID) | ORAL | Status: DC | PRN

## 2014-02-13 MED ORDER — NAPROXEN 500 MG PO TABS: 500.0000 mg | ORAL_TABLET | Freq: Two times a day (BID) | ORAL | Status: DC

## 2014-02-13 NOTE — Progress Notes (Signed)
[facility] Visit  Patient name: [name] MRN [id number]  Date of birth: 10-Jul-1995  CC & HPI:  [name] is a 18 y.o. Caucasian female presenting today for c/o leaking breast milk from both breasts for a week. Had a baby 06/13/13 and is on Depo.  Also c/o cramps, HA, nausea for a few weeks. Has been both here and ED for these c/o, and all tests have been negative.  C/O lots of stress:  Baby dad recently released from jail, started a 2nd job that she hates.  Gets mad and blows up all the time.    Pertinent History Reviewed:  Medical & Surgical Hx:   Past Medical History  Diagnosis Date  . Anxiety   . Depression   . Irregular menstrual bleeding 01/31/2014   Past Surgical History  Procedure Laterality Date  . Tympanostomy tube placement    . Cyst removal neck    . Tympanostomy tube placement    . Cholecystectomy     Medications: Reviewed & Updated - see associated section Social History: Reviewed -  reports that she has been smoking Cigarettes.  She has been smoking about 1.00 pack per day. She has never used smokeless tobacco.  Objective Findings:  Vitals: BP 118/90  Ht  (1.676 m)  Wt 228 lb (103.42 kg)  BMI 36.82 kg/m2  LMP 01/29/2014  Physical Examination: General appearance - alert, well appearing, and in no distress Mental status - alert, oriented to person, place, and time Breasts - breasts appear normal, no suspicious masses, no skin or nipple changes or axillary nodes.  Unable to elicit any discharge  Results for orders placed in visit on 02/13/14 (from the past 24 hour(s))  POCT URINE PREGNANCY   Collection Time    02/13/14  9:32 AM      Result Value Ref Range   Preg Test, Ur Negative       Assessment & Plan:  A:   Galactorrhea, probably 2/2 recent childbirth or depo  Stress P:  Prolactin level   [facility] referral  Naproxyn/zofran for cramps/nausea  F/U prn   CRESENZO-DISHMAN,Naija Troost CNM 02/13/2014 9:32 AM

## 2014-02-14 LAB — PROLACTIN: PROLACTIN: 4.9 ng/mL

## 2014-04-03 ENCOUNTER — Ambulatory Visit: Payer: Medicaid Other

## 2014-04-04 ENCOUNTER — Encounter: Payer: Self-pay | Admitting: Advanced Practice Midwife

## 2014-04-04 ENCOUNTER — Ambulatory Visit (INDEPENDENT_AMBULATORY_CARE_PROVIDER_SITE_OTHER): Payer: Medicaid Other | Admitting: Advanced Practice Midwife

## 2014-04-04 VITALS — BP 120/70 | Ht 66.0 in | Wt 232.0 lb

## 2014-04-04 DIAGNOSIS — Z3009 Encounter for other general counseling and advice on contraception: Secondary | ICD-10-CM

## 2014-04-04 MED ORDER — MISOPROSTOL 200 MCG PO TABS: 800.0000 ug | ORAL_TABLET | Freq: Once | ORAL | Status: DC

## 2014-04-04 NOTE — Progress Notes (Signed)
[facility] Visit  Patient name: [name] MRN [name]  Date of birth: [birth year]  CC & HPI:  [name] is a 18 y.o. Caucasian female presenting today for discussion of birth control.  Has been on Depo (last shot 7/29 under the encounters tab--13 weeks up 10/28) and has gained weight. Options discussed.  Will try Lyletta IUD.  SE/benefits/risks discussed.   Pertinent History Reviewed:  Medical & Surgical Hx:   Past Medical History  Diagnosis Date  . Anxiety   . Depression   . Irregular menstrual bleeding 01/31/2014   Past Surgical History  Procedure Laterality Date  . Tympanostomy tube placement    . Cyst removal neck    . Tympanostomy tube placement    . Cholecystectomy     Medications: Reviewed & Updated - see associated section Social History: Reviewed -  reports that she has been smoking Cigarettes.  She has a 4 pack-year smoking history. She has never used smokeless tobacco.  Objective Findings:  Vitals: BP 120/70  Ht 5\' 6"  (1.676 m)  Wt 232 lb (105.235 kg)  BMI 37.46 kg/m2  Breastfeeding? No    No results found for this or any previous visit (from the past 24 hour(s)).   Assessment & Plan:  A:   Contraception counseling P:  10.28.15 for IUD.  Will premedicate with cytotec   CRESENZO-DISHMAN,Norina Cowper CNM 04/04/2014 3:58 PM

## 2014-04-10 ENCOUNTER — Encounter: Payer: Self-pay | Admitting: Women's Health

## 2014-04-10 ENCOUNTER — Ambulatory Visit (INDEPENDENT_AMBULATORY_CARE_PROVIDER_SITE_OTHER): Payer: Medicaid Other | Admitting: Women's Health

## 2014-04-10 VITALS — BP 100/70 | Ht 66.0 in | Wt 233.0 lb

## 2014-04-10 DIAGNOSIS — Z30014 Encounter for initial prescription of intrauterine contraceptive device: Secondary | ICD-10-CM

## 2014-04-10 DIAGNOSIS — Z3202 Encounter for pregnancy test, result negative: Secondary | ICD-10-CM

## 2014-04-10 DIAGNOSIS — Z30430 Encounter for insertion of intrauterine contraceptive device: Secondary | ICD-10-CM

## 2014-04-10 LAB — POCT URINE PREGNANCY: Preg Test, Ur: NEGATIVE

## 2014-04-10 NOTE — Patient Instructions (Signed)
 Nothing in vagina for 3 days (no sex, douching, tampons, etc...)  Check your strings once a month to make sure you can feel them, if you are not able to please let us know  If you develop a fever of 100.4 or more in the next few weeks, or if you develop severe abdominal pain, please let us know  Use a backup method of birth control, such as condoms, for 2 weeks    Levonorgestrel intrauterine device (IUD)- Liletta What is this medicine? LEVONORGESTREL IUD (LEE voe nor jes trel) is a contraceptive (birth control) device. The device is placed inside the uterus by a healthcare professional. It is used to prevent pregnancy and can also be used to treat heavy bleeding that occurs during your period. Depending on the device, it can be used for 3 to 5 years. This medicine may be used for other purposes; ask your health care provider or pharmacist if you have questions. COMMON BRAND NAME(S): LILETTA, Mirena, Skyla What should I tell my health care provider before I take this medicine? They need to know if you have any of these conditions: -abnormal Pap smear -cancer of the breast, uterus, or cervix -diabetes -endometritis -genital or pelvic infection now or in the past -have more than one sexual partner or your partner has more than one partner -heart disease -history of an ectopic or tubal pregnancy -immune system problems -IUD in place -liver disease or tumor -problems with blood clots or take blood-thinners -use intravenous drugs -uterus of unusual shape -vaginal bleeding that has not been explained -an unusual or allergic reaction to levonorgestrel, other hormones, silicone, or polyethylene, medicines, foods, dyes, or preservatives -pregnant or trying to get pregnant -breast-feeding How should I use this medicine? This device is placed inside the uterus by a health care professional. Talk to your pediatrician regarding the use of this medicine in children. Special care may be  needed. Overdosage: If you think you have taken too much of this medicine contact a poison control center or emergency room at once. NOTE: This medicine is only for you. Do not share this medicine with others. What if I miss a dose? This does not apply. What may interact with this medicine? Do not take this medicine with any of the following medications: -amprenavir -bosentan -fosamprenavir This medicine may also interact with the following medications: -aprepitant -barbiturate medicines for inducing sleep or treating seizures -bexarotene -griseofulvin -medicines to treat seizures like carbamazepine, ethotoin, felbamate, oxcarbazepine, phenytoin, topiramate -modafinil -pioglitazone -rifabutin -rifampin -rifapentine -some medicines to treat HIV infection like atazanavir, indinavir, lopinavir, nelfinavir, tipranavir, ritonavir -St. John's wort -warfarin This list may not describe all possible interactions. Give your health care provider a list of all the medicines, herbs, non-prescription drugs, or dietary supplements you use. Also tell them if you smoke, drink alcohol, or use illegal drugs. Some items may interact with your medicine. What should I watch for while using this medicine? Visit your doctor or health care professional for regular check ups. See your doctor if you or your partner has sexual contact with others, becomes HIV positive, or gets a sexual transmitted disease. This product does not protect you against HIV infection (AIDS) or other sexually transmitted diseases. You can check the placement of the IUD yourself by reaching up to the top of your vagina with clean fingers to feel the threads. Do not pull on the threads. It is a good habit to check placement after each menstrual period. Call your doctor right away if   you feel more of the IUD than just the threads or if you cannot feel the threads at all. The IUD may come out by itself. You may become pregnant if the device  comes out. If you notice that the IUD has come out use a backup birth control method like condoms and call your health care provider. Using tampons will not change the position of the IUD and are okay to use during your period. What side effects may I notice from receiving this medicine? Side effects that you should report to your doctor or health care professional as soon as possible: -allergic reactions like skin rash, itching or hives, swelling of the face, lips, or tongue -fever, flu-like symptoms -genital sores -high blood pressure -no menstrual period for 6 weeks during use -pain, swelling, warmth in the leg -pelvic pain or tenderness -severe or sudden headache -signs of pregnancy -stomach cramping -sudden shortness of breath -trouble with balance, talking, or walking -unusual vaginal bleeding, discharge -yellowing of the eyes or skin Side effects that usually do not require medical attention (report to your doctor or health care professional if they continue or are bothersome): -acne -breast pain -change in sex drive or performance -changes in weight -cramping, dizziness, or faintness while the device is being inserted -headache -irregular menstrual bleeding within first 3 to 6 months of use -nausea This list may not describe all possible side effects. Call your doctor for medical advice about side effects. You may report side effects to FDA at 1-800-FDA-1088. Where should I keep my medicine? This does not apply. NOTE: This sheet is a summary. It may not cover all possible information. If you have questions about this medicine, talk to your doctor, pharmacist, or health care provider.  2015, Elsevier/Gold Standard. (2011-07-01 13:54:04)  

## 2014-04-10 NOTE — Progress Notes (Signed)
Patient ID: [name], female   DOB: 05/28/1996, 18 y.o.   MRN: [id number] [name] is a 18 y.o. year old [id number] Caucasian female who presents for placement of a Liletta IUD. She is 10 months s/p SVB. She has been on depo, but has gained 15lbs so wants Liletta. Last depo 7/29, so covered until today 10/28.    No LMP recorded. Patient has had an injection. BP 100/70  Ht 5\' 6"  (1.676 m)  Wt 233 lb (105.688 kg)  BMI 37.63 kg/m2 Last sexual intercourse was Friday, and pregnancy test today was neg  The risks and benefits of the method and placement have been thouroughly reviewed with the patient and all questions were answered.  Specifically the patient is aware of failure rate of 06/998, expulsion of the IUD and of possible perforation.  The patient is aware of irregular bleeding due to the method and understands the incidence of irregular bleeding diminishes with time.  Signed copy of informed consent in chart.  She understands the [place] [employer]iletta is currently marketed for 3 years, but may be approved for 5 year and possibly 7 year use during the lifetime of her Liletta.  Time out was performed.  A graves speculum was placed in the vagina.  The cervix was visualized, prepped using Betadine, and grasped with a single tooth tenaculum. The uterus was found to be anteroflexed and it sounded to 7 cm.  Liletta IUD placed per manufacturer's recommendations.   The strings were trimmed to 3 cm.  Sonogram was performed and the proper placement of the IUD was verified via transvaginal u/s.   The patient was given post procedure instructions, including signs and symptoms of infection and to check for the strings after each menses or each month, and refraining from intercourse or anything in the vagina for 3 days.  She was given a [place] care card with date Liletta placed, and date Liletta to be removed.  She is scheduled for a f/u appointment in 4 weeks.  Marge DuncansBooker, Loeta Herst Randall CNM,  [facility]NP-BC 04/10/2014 10:36 AM

## 2014-04-15 ENCOUNTER — Encounter: Payer: Self-pay | Admitting: Women's Health

## 2014-05-08 ENCOUNTER — Ambulatory Visit: Payer: Medicaid Other | Admitting: Women's Health

## 2014-05-15 ENCOUNTER — Encounter: Payer: Self-pay | Admitting: Women's Health

## 2014-05-15 ENCOUNTER — Ambulatory Visit (INDEPENDENT_AMBULATORY_CARE_PROVIDER_SITE_OTHER): Payer: Medicaid Other | Admitting: Women's Health

## 2014-05-15 VITALS — BP 126/70 | Ht 66.0 in | Wt 239.0 lb

## 2014-05-15 DIAGNOSIS — Z30431 Encounter for routine checking of intrauterine contraceptive device: Secondary | ICD-10-CM

## 2014-05-15 NOTE — Patient Instructions (Signed)
Return after you turn 19 for your pap smear

## 2014-05-15 NOTE — Progress Notes (Signed)
Patient ID: [name], female   DOB: November 22, 1995, 18 y.o.   MRN: [id number]   [facility]  Patient name: [name] MRN [id number]  Date of birth: November 22, 1995  CC & HPI:  [name] is a 18 y.o. Caucasian female presenting today for 4wk Liletta IUD check. Began having some light bleeding the other day, then brown mucousy d/c which is now resolving, otherwise she loves it. Has not tried checking her strings, is scared to, but did have someone else check them and plans to continue w/ that person checking them monthly.   Pertinent History Reviewed:  Medical & Surgical Hx:   Past Medical History  Diagnosis Date  . Anxiety   . Depression   . Irregular menstrual bleeding 01/31/2014   Past Surgical History  Procedure Laterality Date  . Tympanostomy tube placement    . Cyst removal neck    . Tympanostomy tube placement    . Cholecystectomy     Medications: Reviewed & Updated - see associated section Social History: Reviewed -  reports that she has been smoking Cigarettes.  She has a 4 pack-year smoking history. She has never used smokeless tobacco.  Objective Findings:  Vitals: BP 126/70 mmHg  Ht 5\' 6"  (1.676 m)  Wt 239 lb (108.41 kg)  BMI 38.59 kg/m2  Physical Examination: General appearance - alert, well appearing, and in no distress Pelvic - normal external genitalia, vulva, vagina, cervix, uterus and adnexa, Liletta IUD strings visible, small amount tan nonodorous d/c  No results found for this or any previous visit (from the past 24 hour(s)).   Assessment & Plan:  A:   Liletta IUD check P:  Reassured irregular bleeding normal   F/U @ 19yo for pap & physical   Marge DuncansBooker, Logyn Dedominicis Randall CNM, [facility] 05/15/2014 2:51 PM

## 2014-05-22 ENCOUNTER — Telehealth: Payer: Self-pay | Admitting: Advanced Practice Midwife

## 2014-05-22 NOTE — Telephone Encounter (Signed)
Pt states that she was in the other day for IUD check and had been having a brownish d/c, she is now having some bleeding with long clots that don't really look like normal clots she has with her periods, she denies any pain or heavy bleeding.  Advised pt that it can be normal to have some abnormal bleeding for first 3 - 6 months with the IUD, keep an eye on it and if develops any cramping, pain, heavy bleeding or d/c to call us back and we will make her an appointment to be seen.  Pt verbalized understanding.

## 2014-06-20 ENCOUNTER — Encounter (HOSPITAL_COMMUNITY): Payer: Self-pay | Admitting: *Deleted

## 2014-06-20 ENCOUNTER — Emergency Department (HOSPITAL_COMMUNITY)
Admission: EM | Admit: 2014-06-20 | Discharge: 2014-06-20 | Disposition: A | Discharge status: Home / Self Care | Payer: Medicaid Other | Attending: Emergency Medicine | Admitting: Emergency Medicine

## 2014-06-20 ENCOUNTER — Emergency Department (HOSPITAL_COMMUNITY): Payer: Medicaid Other

## 2014-06-20 DIAGNOSIS — M25562 Pain in left knee: Secondary | ICD-10-CM | POA: Insufficient documentation

## 2014-06-20 DIAGNOSIS — R52 Pain, unspecified: Secondary | ICD-10-CM

## 2014-06-20 DIAGNOSIS — Z720 Tobacco use: Secondary | ICD-10-CM | POA: Insufficient documentation

## 2014-06-20 DIAGNOSIS — F419 Anxiety disorder, unspecified: Secondary | ICD-10-CM | POA: Insufficient documentation

## 2014-06-20 DIAGNOSIS — M25532 Pain in left wrist: Secondary | ICD-10-CM | POA: Insufficient documentation

## 2014-06-20 DIAGNOSIS — F329 Major depressive disorder, single episode, unspecified: Secondary | ICD-10-CM | POA: Insufficient documentation

## 2014-06-20 MED ORDER — IBUPROFEN 800 MG PO TABS
800.0000 mg | ORAL_TABLET | Freq: Once | ORAL | Status: AC
  Administered 2014-06-20: 800 mg via ORAL
  Filled 2014-06-20: qty 1

## 2014-06-20 MED ORDER — IBUPROFEN 800 MG PO TABS: 800.0000 mg | ORAL_TABLET | Freq: Three times a day (TID) | ORAL | Status: DC

## 2014-06-20 NOTE — ED Provider Notes (Signed)
CSN: [id number]     Arrival date & time 06/20/14  1501 History   First MD Initiated Contact with Patient 06/20/14 1606     Chief Complaint  Patient presents with  . Wrist Pain     (Consider location/radiation/quality/duration/timing/severity/associated sxs/prior Treatment) HPI Comments: Patient is a 19 year old female who presents to the emergency department with a complaint of left knee pain and left wrist pain. The patient states that the patient states that left wrist has been bothering her for nearly a week ago. She at times has a sensation of numbness in the hand. This pain is aggravated by repetitive use, she does get some relief when resting it that the pain does not go away completely. The patient describes the pain in the left knee as sharp, and is accompanied at times by "locking up". She has not had any injury or trauma to the left knee or to the left wrist. His been no previous operations or procedures involving  these 2 areas.    Patient is a 19 y.o. female presenting with wrist pain. The history is provided by the patient.  Wrist Pain Associated symptoms include arthralgias. Pertinent negatives include no abdominal pain, chest pain, coughing or neck pain.    Past Medical History  Diagnosis Date  . Anxiety   . Depression   . Irregular menstrual bleeding 01/31/2014   Past Surgical History  Procedure Laterality Date  . Tympanostomy tube placement    . Cyst removal neck    . Tympanostomy tube placement    . Cholecystectomy     Family History  Problem Relation Age of Onset  . Bipolar disorder Maternal Grandmother   . Heart disease Maternal Grandfather   . Thyroid disease Paternal Grandmother   . Arthritis Mother   . Fibromyalgia Mother   . Birth defects Father    History  Substance Use Topics  . Smoking status: Current Every Day Smoker -- 1.00 packs/day for 4 years    Types: Cigarettes  . Smokeless tobacco: Never Used  . Alcohol Use: No     Comment: socially    OB History    Gravida Para Term Preterm AB TAB SAB Ectopic Multiple Living   Review of Systems  Constitutional: Negative for activity change.       All ROS Neg except as noted in HPI  HENT: Negative for nosebleeds.   Eyes: Negative for photophobia and discharge.  Respiratory: Negative for cough, shortness of breath and wheezing.   Cardiovascular: Negative for chest pain and palpitations.  Gastrointestinal: Negative for abdominal pain and blood in stool.  Genitourinary: Negative for dysuria, frequency and hematuria.  Musculoskeletal: Positive for arthralgias. Negative for back pain and neck pain.  Skin: Negative.   Neurological: Negative for dizziness, seizures and speech difficulty.  Psychiatric/Behavioral: Negative for hallucinations and confusion. The patient is nervous/anxious.        Depression      Allergies  Review of patient's allergies indicates no known allergies.  Home Medications   Prior to Admission medications   Medication Sig Start Date End Date Taking? Authorizing Provider  ibuprofen (ADVIL,MOTRIN) 800 MG tablet Take 1 tablet (800 mg total) by mouth 3 (three) times daily. 06/20/14   Kathie Dike, PA-C  medroxyPROGESTERone (DEPO-PROVERA) 150 MG/ML injection Inject 1 mL (150 mg total) into the muscle every 3 (three) months. Patient not taking: Reported on 05/15/2014 07/17/13   Marge Duncans,  CNM  misoprostol (CYTOTEC) 200 MCG tablet Take 4 tablets (800 mcg total) by mouth once. Patient not taking: Reported on 05/15/2014 04/04/14   Jacklyn ShellFrances Cresenzo-Dishmon, CNM   BP 126/61 mmHg  Pulse 104  Temp(Src) 98.6 F (37 C) (Oral)  Resp 20  Ht 5\' 7"  (1.702 m)  Wt 235 lb (106.595 kg)  BMI 36.80 kg/m2  SpO2 99% Physical Exam  Constitutional: She is oriented to person, place, and time. She appears well-developed and well-nourished.  Non-toxic appearance.  HENT:  Head: Normocephalic.  Right Ear: Tympanic membrane and external ear normal.   Left Ear: Tympanic membrane and external ear normal.  Eyes: EOM and lids are normal. Pupils are equal, round, and reactive to light.  Neck: Normal range of motion. Neck supple. Carotid bruit is not present.  Cardiovascular: Normal rate, regular rhythm, normal heart sounds, intact distal pulses and normal pulses.   Pulmonary/Chest: Breath sounds normal. No respiratory distress.  Abdominal: Soft. Bowel sounds are normal. There is no tenderness. There is no guarding.  Musculoskeletal: Normal range of motion.  There is full range of motion of the left shoulder, wrists, elbow. There is soreness with flexion and extension of the wrist. I cannot demonstrate a positive Tinel's sign. There is no atrophy of the thenar eminence. The radial pulse on the left is 2+, the radial pulse on the right is also 2+. The capillary refill is less than 3 seconds.  There is no deformity of the left quadricep area. There is no effusion of the knee. There is no posterior mass appreciated. There is no increased warmth or deformity of the anterior tibial tuberosity. There is full range of motion of the left hip, knee, and ankle. There is crepitus noted with flexion and extension of the left knee.  Lymphadenopathy:       Head (right side): No submandibular adenopathy present.       Head (left side): No submandibular adenopathy present.    She has no cervical adenopathy.  Neurological: She is alert and oriented to person, place, and time. She has normal strength. No cranial nerve deficit or sensory deficit.  Skin: Skin is warm and dry.  Psychiatric: She has a normal mood and affect. Her speech is normal.  Nursing note and vitals reviewed.   ED Course  Procedures (including critical care time) Labs Review Labs Reviewed - No data to display  Imaging Review Dg Wrist Complete Left  06/20/2014   CLINICAL DATA:  Left wrist and hand pain for 1 week. No known injury. Initial encounter.  EXAM: LEFT WRIST - COMPLETE 3+ VIEW   COMPARISON:  None.  FINDINGS: Imaged bones, joints and soft tissues appear normal.  IMPRESSION: Normal exam.   Electronically Signed   By: Drusilla Kannerhomas  Dalessio M.D.   On: 06/20/2014 15:58   Dg Knee Complete 4 Views Left  06/20/2014   CLINICAL DATA:  Anterior and lateral left knee pain. No injury. Locking up.  EXAM: LEFT KNEE - COMPLETE 4+ VIEW  COMPARISON:  None.  FINDINGS: There is no evidence of fracture, dislocation, or joint effusion. There is no evidence of arthropathy or other focal bone abnormality. Soft tissues are unremarkable.  IMPRESSION: Negative.   Electronically Signed   By: Elberta Fortisaniel  Boyle M.D.   On: 06/20/2014 15:56     EKG Interpretation None      MDM  Vital signs are within normal limits with exception of the pulse rate being slightly elevated at 104. X-ray of the left wrist and hand  are negative, x-ray of the knee on the left are also negative for any acute changes. 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 I have advised the patient to see her primary physician for additional evaluation and possible consultation with a specialist. The patient is placed in a wrist splint, and a prescription for ibuprofen 800 mg given to the patient. The patient will return to the emergency department if any emergent changes, problems, or concerns.    Final diagnoses:  Wrist pain, left  Knee pain, left    *I have reviewed nursing notes, vital signs, and all appropriate lab and imaging results for this patient.**    Kathie Dike, PA-C 06/20/14 1643  Juliet Rude. Rubin Payor, MD 06/21/14 [id number]

## 2014-06-20 NOTE — Discharge Instructions (Signed)
Your x-rays are negative for fracture or dislocation or other acute problems. Please use the wrist splint for the next 2 weeks. Please have your primary physician to reassess your multiple joint area pains to see if you'll need additional consultations. Please use ibuprofen 3 times daily with food. Arthralgia Your caregiver has diagnosed you as suffering from an arthralgia. Arthralgia means there is pain in a joint. This can come from many reasons including:  Bruising the joint which causes soreness (inflammation) in the joint.  Wear and tear on the joints which occur as we grow older (osteoarthritis).  Overusing the joint.  Various forms of arthritis.  Infections of the joint. Regardless of the cause of pain in your joint, most of these different pains respond to anti-inflammatory drugs and rest. The exception to this is when a joint is infected, and these cases are treated with antibiotics, if it is a bacterial infection. HOME CARE INSTRUCTIONS   Rest the injured area for as long as directed by your caregiver. Then slowly start using the joint as directed by your caregiver and as the pain allows. Crutches as directed may be useful if the ankles, knees or hips are involved. If the knee was splinted or casted, continue use and care as directed. If an stretchy or elastic wrapping bandage has been applied today, it should be removed and re-applied every 3 to 4 hours. It should not be applied tightly, but firmly enough to keep swelling down. Watch toes and feet for swelling, bluish discoloration, coldness, numbness or excessive pain. If any of these problems (symptoms) occur, remove the ace bandage and re-apply more loosely. If these symptoms persist, contact your caregiver or return to this location.  For the first 24 hours, keep the injured extremity elevated on pillows while lying down.  Apply ice for 15-20 minutes to the sore joint every couple hours while awake for the first half day. Then  03-04 times per day for the first 48 hours. Put the ice in a plastic bag and place a towel between the bag of ice and your skin.  Wear any splinting, casting, elastic bandage applications, or slings as instructed.  Only take over-the-counter or prescription medicines for pain, discomfort, or fever as directed by your caregiver. Do not use aspirin immediately after the injury unless instructed by your physician. Aspirin can cause increased bleeding and bruising of the tissues.  If you were given crutches, continue to use them as instructed and do not resume weight bearing on the sore joint until instructed. Persistent pain and inability to use the sore joint as directed for more than 2 to 3 days are warning signs indicating that you should see a caregiver for a follow-up visit as soon as possible. Initially, a hairline fracture (break in bone) may not be evident on X-rays. Persistent pain and swelling indicate that further evaluation, non-weight bearing or use of the joint (use of crutches or slings as instructed), or further X-rays are indicated. X-rays may sometimes not show a small fracture until a week or 10 days later. Make a follow-up appointment with your own caregiver or one to whom we have referred you. A radiologist (specialist in reading X-rays) may read your X-rays. Make sure you know how you are to obtain your X-ray results. Do not assume everything is normal if you do not hear from us. SEEK MEDICAL CARE IF: Bruising, swelling, or pain increases. SEEK IMMEDIATE MEDICAL CARE IF:   Your fingers or toes are numb or blue.  The pain is not responding to medications and continues to stay the same or get worse.  The pain in your joint becomes severe.  You develop a fever over 102 F (38.9 C).  It becomes impossible to move or use the joint. MAKE SURE YOU:   Understand these instructions.  Will watch your condition.  Will get help right away if you are not doing well or get  worse. Document Released: 05/31/2005 Document Revised: 08/23/2011 Document Reviewed: 01/17/2008 [facility] Patient Information 2015 [place], [place]. This information is not intended to replace advice given to you by your health care provider. Make sure you discuss any questions you have with your health care provider.

## 2014-06-20 NOTE — ED Notes (Signed)
Pt with wrist pain that come and goes for a week, pt denies any injury, states has numbness to left hand at times

## 2014-06-20 NOTE — ED Notes (Signed)
Pt with left knee pain also

## 2014-08-06 ENCOUNTER — Ambulatory Visit (INDEPENDENT_AMBULATORY_CARE_PROVIDER_SITE_OTHER): Payer: Medicaid Other | Admitting: Advanced Practice Midwife

## 2014-08-06 ENCOUNTER — Encounter: Payer: Self-pay | Admitting: Advanced Practice Midwife

## 2014-08-06 VITALS — BP 128/80 | HR 90 | Ht 67.0 in | Wt 241.0 lb

## 2014-08-06 DIAGNOSIS — N760 Acute vaginitis: Secondary | ICD-10-CM

## 2014-08-06 DIAGNOSIS — A499 Bacterial infection, unspecified: Secondary | ICD-10-CM

## 2014-08-06 DIAGNOSIS — N898 Other specified noninflammatory disorders of vagina: Secondary | ICD-10-CM

## 2014-08-06 DIAGNOSIS — B9689 Other specified bacterial agents as the cause of diseases classified elsewhere: Secondary | ICD-10-CM

## 2014-08-06 MED ORDER — METRONIDAZOLE 0.75 % VA GEL: 1.0000 | Freq: Every day | VAGINAL | Status: DC

## 2014-08-06 NOTE — Progress Notes (Addendum)
[facility] Visit  Patient name: [name] MRN [id number]  Date of birth: Dec 12, 1995  CC & HPI:  [name] is a 19 y.o. Caucasian female presenting today for Vaginal discharge with odor, bleeding after intercourse, and intermittent cramping for 1 week.   Pertinent History Reviewed:  Medical & Surgical Hx:   Past Medical History  Diagnosis Date  . Anxiety   . Depression   . Irregular menstrual bleeding 01/31/2014   Past Surgical History  Procedure Laterality Date  . Tympanostomy tube placement    . Cyst removal neck    . Tympanostomy tube placement    . Cholecystectomy     Family History  Problem Relation Age of Onset  . Bipolar disorder Maternal Grandmother   . Heart disease Maternal Grandfather   . Thyroid disease Paternal Grandmother   . Arthritis Mother   . Fibromyalgia Mother   . Birth defects Father     Current outpatient prescriptions:  .  metroNIDAZOLE (METROGEL VAGINAL) 0.75 % vaginal gel, Place 1 Applicatorful vaginally at bedtime., Disp: 70 g, Rfl: 0 Social History: Reviewed -  reports that she has been smoking Cigarettes.  She has a 4 pack-year smoking history. She has never used smokeless tobacco.  Review of Systems:   Constitutional: Negative for fever and chills Gastrointestinal: Negative for abdominal pain Genitourinary: Negative for dysuria and urgency, vaginal irritation or itching   Objective Findings:  Vitals: BP 128/80 mmHg  Pulse 90  Ht 5\' 7"  (1.702 m)  Wt 241 lb (109.317 kg)  BMI 37.74 kg/m2  Breastfeeding? No  Physical Examination: General appearance - alert, well appearing, and in no distress Mental status - normal mood, behavior, speech, dress, motor activity, and thought processes Pelvic - VULVA: normal appearing vulva with no masses, tenderness or lesions,  VAGINA: frothy yellow discharge with amine odor CERVIX: friable   WET MOUNT done - results: clue cells, TNTC WBC, no trich or yeast Musculoskeletal - no  muscular tenderness noted Extremities - no pedal edema noted Skin - normal coloration and turgor, no rashes, no suspicious skin lesions noted  No results found for this or any previous visit (from the past 24 hour(s)).     Assessment & Plan:  A:   BV, leukorrhea    P: Rx metrogel qhs X5  GC/CHL    CRESENZO-DISHMAN,Maygen Sirico CNM 08/06/2014 4:34 PM90 nn

## 2014-08-09 LAB — GC/CHLAMYDIA PROBE AMP
Chlamydia trachomatis, NAA: NEGATIVE
Neisseria gonorrhoeae by PCR: NEGATIVE

## 2014-11-27 ENCOUNTER — Encounter (HOSPITAL_COMMUNITY): Payer: Self-pay | Admitting: Emergency Medicine

## 2014-11-27 ENCOUNTER — Emergency Department (HOSPITAL_COMMUNITY)
Admission: EM | Admit: 2014-11-27 | Discharge: 2014-11-27 | Disposition: A | Discharge status: Home / Self Care | Payer: Medicaid Other | Attending: Emergency Medicine | Admitting: Emergency Medicine

## 2014-11-27 DIAGNOSIS — Z8659 Personal history of other mental and behavioral disorders: Secondary | ICD-10-CM | POA: Insufficient documentation

## 2014-11-27 DIAGNOSIS — R509 Fever, unspecified: Secondary | ICD-10-CM

## 2014-11-27 DIAGNOSIS — R52 Pain, unspecified: Secondary | ICD-10-CM

## 2014-11-27 DIAGNOSIS — Z8742 Personal history of other diseases of the female genital tract: Secondary | ICD-10-CM | POA: Insufficient documentation

## 2014-11-27 DIAGNOSIS — R0989 Other specified symptoms and signs involving the circulatory and respiratory systems: Secondary | ICD-10-CM | POA: Insufficient documentation

## 2014-11-27 DIAGNOSIS — Z792 Long term (current) use of antibiotics: Secondary | ICD-10-CM | POA: Insufficient documentation

## 2014-11-27 DIAGNOSIS — N390 Urinary tract infection, site not specified: Secondary | ICD-10-CM

## 2014-11-27 DIAGNOSIS — Z720 Tobacco use: Secondary | ICD-10-CM | POA: Insufficient documentation

## 2014-11-27 DIAGNOSIS — Z3202 Encounter for pregnancy test, result negative: Secondary | ICD-10-CM | POA: Insufficient documentation

## 2014-11-27 LAB — URINALYSIS, ROUTINE W REFLEX MICROSCOPIC
Bilirubin Urine: NEGATIVE
Glucose, UA: NEGATIVE mg/dL
Ketones, ur: NEGATIVE mg/dL
Nitrite: NEGATIVE
Protein, ur: NEGATIVE mg/dL
Specific Gravity, Urine: 1.025 (ref 1.005–1.030)
Urobilinogen, UA: 0.2 mg/dL (ref 0.0–1.0)
pH: 5.5 (ref 5.0–8.0)

## 2014-11-27 LAB — URINE MICROSCOPIC-ADD ON

## 2014-11-27 LAB — PREGNANCY, URINE: Preg Test, Ur: NEGATIVE

## 2014-11-27 MED ORDER — KETOROLAC TROMETHAMINE 30 MG/ML IJ SOLN: 15.0000 mg | Freq: Once | INTRAMUSCULAR | Status: DC

## 2014-11-27 MED ORDER — SODIUM CHLORIDE 0.9 % IV BOLUS (SEPSIS): 1000.0000 mL | Freq: Once | INTRAVENOUS | Status: DC

## 2014-11-27 MED ORDER — CEPHALEXIN 500 MG PO CAPS
500.0000 mg | ORAL_CAPSULE | Freq: Once | ORAL | Status: AC
  Administered 2014-11-27: 500 mg via ORAL
  Filled 2014-11-27: qty 1

## 2014-11-27 MED ORDER — CEPHALEXIN 500 MG PO CAPS: 500.0000 mg | ORAL_CAPSULE | Freq: Four times a day (QID) | ORAL | Status: DC

## 2014-11-27 MED ORDER — MORPHINE SULFATE 4 MG/ML IJ SOLN
8.0000 mg | Freq: Once | INTRAMUSCULAR | Status: DC
Start: 2014-11-27 — End: 2014-11-27

## 2014-11-27 MED ORDER — OXYCODONE-ACETAMINOPHEN 5-325 MG PO TABS
1.0000 | ORAL_TABLET | Freq: Once | ORAL | Status: AC
Start: 2014-11-27 — End: 2014-11-27
  Administered 2014-11-27: 1 via ORAL
  Filled 2014-11-27: qty 1

## 2014-11-27 MED ORDER — IBUPROFEN 400 MG PO TABS
600.0000 mg | ORAL_TABLET | Freq: Once | ORAL | Status: AC
  Administered 2014-11-27: 600 mg via ORAL
  Filled 2014-11-27: qty 2

## 2014-11-27 MED ORDER — DEXTROSE 5 % IV SOLN: 1.0000 g | Freq: Once | INTRAVENOUS | Status: DC

## 2014-11-27 NOTE — ED Notes (Addendum)
PT reports thought fever at home with generalized body aches, bilateral ear aches, sore throat, and headache x1 day.

## 2014-11-27 NOTE — ED Notes (Signed)
MD at bedside. 

## 2014-11-27 NOTE — Discharge Instructions (Signed)

## 2014-11-29 LAB — URINE CULTURE

## 2014-12-05 NOTE — ED Provider Notes (Signed)
CSN: [id number]     Arrival date & time 11/27/14  1548 History   First MD Initiated Contact with Patient 11/27/14 1602     Chief Complaint  Patient presents with  . Fever     (Consider location/radiation/quality/duration/timing/severity/associated sxs/prior Treatment) HPI 19 year old female with fever bodyaches, headache and facial congestion. Symptom onset yesterday. Persistent since then. Scratchy feeling in her throat. Took some over-the-counter cold medication this morning with no significant relief. No sick contacts. No vomiting or diarrhea. No specific urinary complaints. No rash.  Past Medical History  Diagnosis Date  . Anxiety   . Depression   . Irregular menstrual bleeding 01/31/2014   Past Surgical History  Procedure Laterality Date  . Tympanostomy tube placement    . Cyst removal neck    . Tympanostomy tube placement    . Cholecystectomy     Family History  Problem Relation Age of Onset  . Bipolar disorder Maternal Grandmother   . Heart disease Maternal Grandfather   . Thyroid disease Paternal Grandmother   . Arthritis Mother   . Fibromyalgia Mother   . Birth defects Father    History  Substance Use Topics  . Smoking status: Current Every Day Smoker -- 1.00 packs/day for 4 years    Types: Cigarettes  . Smokeless tobacco: Never Used  . Alcohol Use: No     Comment: socially   OB History    Gravida Para Term Preterm AB TAB SAB Ectopic Multiple Living   Review of Systems  All systems reviewed and negative, other than as noted in HPI.   Allergies  Review of patient's allergies indicates no known allergies.  Home Medications   Prior to Admission medications   Medication Sig Start Date End Date Taking? Authorizing Provider  cephALEXin (KEFLEX) 500 MG capsule Take 1 capsule (500 mg total) by mouth 4 (four) times daily. 11/27/14   Raeford Razor, MD  metroNIDAZOLE (METROGEL VAGINAL) 0.75 % vaginal gel Place 1 Applicatorful vaginally at  bedtime. Patient not taking: Reported on 11/27/2014 08/06/14   Jacklyn Shell, CNM   BP 130/85 mmHg  Pulse 116  Temp(Src) 100.1 F (37.8 C) (Oral)  Resp 16  Ht  (1.702 m)  Wt 230 lb (104.327 kg)  BMI 36.01 kg/m2  SpO2 100% Physical Exam  Constitutional: She appears well-developed and well-nourished. No distress.  HENT:  Head: Normocephalic and atraumatic.  Eyes: Conjunctivae are normal. Right eye exhibits no discharge. Left eye exhibits no discharge.  Neck: Neck supple.  Cardiovascular: Normal rate, regular rhythm and normal heart sounds.  Exam reveals no gallop and no friction rub.   No murmur heard. Pulmonary/Chest: Effort normal and breath sounds normal. No respiratory distress.  Abdominal: Soft. She exhibits no distension. There is no tenderness.  Musculoskeletal: She exhibits no edema or tenderness.  Neurological: She is alert.  Skin: Skin is warm and dry.  Psychiatric: She has a normal mood and affect. Her behavior is normal. Thought content normal.  Nursing note and vitals reviewed.   ED Course  Procedures (including critical care time) Labs Review Labs Reviewed  URINALYSIS, ROUTINE W REFLEX MICROSCOPIC (NOT AT [facility]) - Abnormal; Notable for the following:    APPearance HAZY (*)    Hgb urine dipstick LARGE (*)    Leukocytes, UA SMALL (*)    All other components within normal limits  URINE MICROSCOPIC-ADD ON - Abnormal; Notable for the following:  Squamous Epithelial / LPF MANY (*)    Bacteria, UA MANY (*)    All other components within normal limits  URINE CULTURE  PREGNANCY, URINE    Imaging Review No results found.   EKG Interpretation None      MDM   Final diagnoses:  Fever, unspecified fever cause  Body aches  UTI (lower urinary tract infection)    19 year old female with fever and bodyaches. Nontoxic. Questionable UTI. Abx. Return precautions were discussed.    Raeford Razor, MD 12/05/14 [phone]

## 2015-01-29 ENCOUNTER — Emergency Department (HOSPITAL_COMMUNITY)
Admission: EM | Admit: 2015-01-29 | Discharge: 2015-01-29 | Disposition: A | Discharge status: Home / Self Care | Payer: Medicaid Other | Attending: Emergency Medicine | Admitting: Emergency Medicine

## 2015-01-29 ENCOUNTER — Encounter (HOSPITAL_COMMUNITY): Payer: Self-pay

## 2015-01-29 DIAGNOSIS — R51 Headache: Secondary | ICD-10-CM | POA: Insufficient documentation

## 2015-01-29 DIAGNOSIS — Z79899 Other long term (current) drug therapy: Secondary | ICD-10-CM | POA: Insufficient documentation

## 2015-01-29 DIAGNOSIS — J029 Acute pharyngitis, unspecified: Secondary | ICD-10-CM | POA: Insufficient documentation

## 2015-01-29 DIAGNOSIS — Z8659 Personal history of other mental and behavioral disorders: Secondary | ICD-10-CM | POA: Insufficient documentation

## 2015-01-29 DIAGNOSIS — Z8742 Personal history of other diseases of the female genital tract: Secondary | ICD-10-CM | POA: Insufficient documentation

## 2015-01-29 DIAGNOSIS — Z720 Tobacco use: Secondary | ICD-10-CM | POA: Insufficient documentation

## 2015-01-29 MED ORDER — HYDROCODONE-ACETAMINOPHEN 7.5-325 MG/15ML PO SOLN: 5.0000 mL | ORAL | Status: DC | PRN

## 2015-01-29 MED ORDER — KETOROLAC TROMETHAMINE 30 MG/ML IJ SOLN
15.0000 mg | Freq: Once | INTRAMUSCULAR | Status: DC
  Filled 2015-01-29: qty 1

## 2015-01-29 MED ORDER — PENICILLIN G BENZATHINE 1200000 UNIT/2ML IM SUSP
1.2000 10*6.[IU] | Freq: Once | INTRAMUSCULAR | Status: AC
  Administered 2015-01-29: 1.2 10*6.[IU] via INTRAMUSCULAR
  Filled 2015-01-29: qty 2

## 2015-01-29 MED ORDER — DEXAMETHASONE 10 MG/ML FOR PEDIATRIC ORAL USE
10.0000 mg | Freq: Once | INTRAMUSCULAR | Status: AC
  Administered 2015-01-29: 10 mg via ORAL
  Filled 2015-01-29: qty 1

## 2015-01-29 MED ORDER — KETOROLAC TROMETHAMINE 60 MG/2ML IM SOLN
60.0000 mg | Freq: Once | INTRAMUSCULAR | Status: AC
  Administered 2015-01-29: 60 mg via INTRAMUSCULAR
  Filled 2015-01-29: qty 2

## 2015-01-29 NOTE — Discharge Instructions (Signed)

## 2015-01-29 NOTE — ED Notes (Signed)
Pt c/o sore throat x 3 days

## 2015-02-06 NOTE — ED Provider Notes (Signed)
CSN: [id number]     Arrival date & time 01/29/15  1331 History   First MD Initiated Contact with Patient 01/29/15 1435     Chief Complaint  Patient presents with  . Sore Throat     (Consider location/radiation/quality/duration/timing/severity/associated sxs/prior Treatment) HPI   19 year old female with sore throat. Onset about 3 days ago. Persistent since then. Subjective fever. Headache. No rash. No urinary complaints. No cough. No respiratory complaints. Instructed taken some ibuprofen with mild relief. No sick contacts.  Past Medical History  Diagnosis Date  . Anxiety   . Depression   . Irregular menstrual bleeding 01/31/2014   Past Surgical History  Procedure Laterality Date  . Tympanostomy tube placement    . Cyst removal neck    . Tympanostomy tube placement    . Cholecystectomy     Family History  Problem Relation Age of Onset  . Bipolar disorder Maternal Grandmother   . Heart disease Maternal Grandfather   . Thyroid disease Paternal Grandmother   . Arthritis Mother   . Fibromyalgia Mother   . Birth defects Father    Social History  Substance Use Topics  . Smoking status: Current Every Day Smoker -- 1.00 packs/day for 4 years    Types: Cigarettes  . Smokeless tobacco: Never Used  . Alcohol Use: No     Comment: socially   OB History    Gravida Para Term Preterm AB TAB SAB Ectopic Multiple Living   Review of Systems  All systems reviewed and negative, other than as noted in HPI.   Allergies  Review of patient's allergies indicates no known allergies.  Home Medications   Prior to Admission medications   Medication Sig Start Date End Date Taking? Authorizing Provider  Garcinia Cambogia-Chromium 500-200 MG-MCG TABS Take 4 tablets by mouth daily.   Yes Historical Provider, MD  Pseudoephedrine-APAP-DM (DAYQUIL MULTI-SYMPTOM COLD/FLU PO) Take 30 mLs by mouth 4 (four) times daily as needed (cold).   Yes Historical Provider, MD   Pseudoephedrine-APAP-DM (DAYQUIL PO) Take 2 capsules by mouth daily as needed (cold).    Yes Historical Provider, MD  cephALEXin (KEFLEX) 500 MG capsule Take 1 capsule (500 mg total) by mouth 4 (four) times daily. Patient not taking: Reported on 01/29/2015 11/27/14   Raeford Razor, MD  HYDROcodone-acetaminophen (HYCET) 7.5-325 mg/15 ml solution Take 5-10 mLs by mouth every 4 (four) hours as needed for moderate pain. 01/29/15   Raeford Razor, MD   BP 127/83 mmHg  Pulse 93  Temp(Src) 98.4 F (36.9 C) (Oral)  Resp 16  Ht  (1.702 m)  Wt 240 lb (108.863 kg)  BMI 37.58 kg/m2  SpO2 100% Physical Exam  Constitutional: She appears well-developed and well-nourished. No distress.  HENT:  Head: Normocephalic and atraumatic.  Exudative pharyngitis. Uvula is midline. Normal sounding voice. Handling secretions. Neck is supple. Tender anterior cervical adenopathy. No stridor.  Eyes: Conjunctivae are normal. Right eye exhibits no discharge. Left eye exhibits no discharge.  Neck: Neck supple.  Cardiovascular: Normal rate, regular rhythm and normal heart sounds.  Exam reveals no gallop and no friction rub.   No murmur heard. Pulmonary/Chest: Effort normal and breath sounds normal. No respiratory distress.  Abdominal: Soft. She exhibits no distension. There is no tenderness.  Musculoskeletal: She exhibits no edema or tenderness.  Neurological: She is alert.  Skin: Skin is warm and dry.  Psychiatric: She has a normal mood and affect.  Her behavior is normal. Thought content normal.  Nursing note and vitals reviewed.   ED Course  Procedures (including critical care time) Labs Review Labs Reviewed - No data to display  Imaging Review No results found. I have personally reviewed and evaluated these images and lab results as part of my medical decision-making.   EKG Interpretation None      MDM   Final diagnoses:  Exudative pharyngitis    19 year old female with exudative pharyngitis.  Treated empirically for strep. No evidence of very compromise. Return precautions discussed.    Raeford Razor, MD 02/06/15 [phone]

## 2015-03-14 ENCOUNTER — Emergency Department (HOSPITAL_COMMUNITY)
Admission: EM | Admit: 2015-03-14 | Discharge: 2015-03-14 | Disposition: A | Discharge status: Home / Self Care | Payer: Medicaid Other | Attending: Emergency Medicine | Admitting: Emergency Medicine

## 2015-03-14 ENCOUNTER — Encounter (HOSPITAL_COMMUNITY): Payer: Self-pay

## 2015-03-14 DIAGNOSIS — Z720 Tobacco use: Secondary | ICD-10-CM | POA: Insufficient documentation

## 2015-03-14 DIAGNOSIS — Z3202 Encounter for pregnancy test, result negative: Secondary | ICD-10-CM | POA: Insufficient documentation

## 2015-03-14 DIAGNOSIS — M62838 Other muscle spasm: Secondary | ICD-10-CM | POA: Insufficient documentation

## 2015-03-14 DIAGNOSIS — Z8659 Personal history of other mental and behavioral disorders: Secondary | ICD-10-CM | POA: Insufficient documentation

## 2015-03-14 DIAGNOSIS — Z8742 Personal history of other diseases of the female genital tract: Secondary | ICD-10-CM | POA: Insufficient documentation

## 2015-03-14 LAB — URINALYSIS, ROUTINE W REFLEX MICROSCOPIC
Bilirubin Urine: NEGATIVE
Glucose, UA: NEGATIVE mg/dL
Hgb urine dipstick: NEGATIVE
Ketones, ur: NEGATIVE mg/dL
LEUKOCYTES UA: NEGATIVE
NITRITE: NEGATIVE
PH: 6 (ref 5.0–8.0)
Protein, ur: NEGATIVE mg/dL
Urobilinogen, UA: 0.2 mg/dL (ref 0.0–1.0)

## 2015-03-14 LAB — I-STAT CHEM 8, ED
BUN: 8 mg/dL (ref 6–20)
CREATININE: 0.6 mg/dL (ref 0.44–1.00)
Calcium, Ion: 1.22 mmol/L (ref 1.12–1.23)
Chloride: 105 mmol/L (ref 101–111)
GLUCOSE: 176 mg/dL — AB (ref 65–99)
HEMATOCRIT: 41 % (ref 36.0–46.0)
Hemoglobin: 13.9 g/dL (ref 12.0–15.0)
POTASSIUM: 3.8 mmol/L (ref 3.5–5.1)
Sodium: 140 mmol/L (ref 135–145)
TCO2: 19 mmol/L (ref 0–100)

## 2015-03-14 LAB — POC URINE PREG, ED: Preg Test, Ur: NEGATIVE

## 2015-03-14 MED ORDER — METHOCARBAMOL 500 MG PO TABS
500.0000 mg | ORAL_TABLET | Freq: Once | ORAL | Status: AC
  Administered 2015-03-14: 500 mg via ORAL
  Filled 2015-03-14: qty 1

## 2015-03-14 MED ORDER — NAPROXEN 500 MG PO TABS: 500.0000 mg | ORAL_TABLET | Freq: Two times a day (BID) | ORAL | Status: DC

## 2015-03-14 MED ORDER — METHOCARBAMOL 500 MG PO TABS: 500.0000 mg | ORAL_TABLET | Freq: Three times a day (TID) | ORAL | Status: DC

## 2015-03-14 NOTE — Discharge Instructions (Signed)
Muscle Cramps and Spasms Muscle cramps and spasms are when muscles tighten by themselves. They usually get better within minutes. Muscle cramps are painful. They are usually stronger and last longer than muscle spasms. Muscle spasms may or may not be painful. They can last a few seconds or much longer. HOME CARE  Drink enough fluid to keep your pee (urine) clear or pale yellow.  Massage, stretch, and relax the muscle.  Use a warm towel, heating pad, or warm shower water on tight muscles.  Place ice on the muscle if it is tender or in pain.  Put ice in a plastic bag.  Place a towel between your skin and the bag.  Leave the ice on for 15-20 minutes, 03-04 times a day.  Only take medicine as told by your doctor. GET HELP RIGHT AWAY IF:  Your cramps or spasms get worse, happen more often, or do not get better with time. MAKE SURE YOU:  Understand these instructions.  Will watch your condition.  Will get help right away if you are not doing well or get worse. Document Released: 05/13/2008 Document Revised: 09/25/2012 Document Reviewed: 05/17/2012 Exit[employer] Patient Information 2015 [employer]. This information is not intended to replace advice given to you by your health care provider. Make sure you discuss any questions you have with your health care provider.  

## 2015-03-14 NOTE — ED Notes (Signed)
Bilateral arm and bilateral leg pain and "locking up" per patient X2 months.

## 2015-03-14 NOTE — ED Provider Notes (Signed)
CSN: [id number]     Arrival date & time 03/14/15  2201 History   First MD Initiated Contact with Patient 03/14/15 2211     Chief Complaint  Patient presents with  . Arm Pain     (Consider location/radiation/quality/duration/timing/severity/associated sxs/prior Treatment) HPI   [name] is a 19 y.o. female who presents to the Emergency Department complaining of intermittent sharp pain to her bilateral hands and upper legs and toes.  Symptoms have been present for 2 months and began shortly after starting a new job that requires her to perform repetitive movements and stand for long periods.  She states that her hands, toes and hips will "lock up" she also states these symptoms occur at night.  She has not tried any therapies or medications for her symptoms.  She states that she came to the ED tonight because her symptoms were more frequent and sever today.  She denies any new medications, OTC diuretics, hx of hypokalemia, fever, chills, swelling or the extremities or numbness.     Past Medical History  Diagnosis Date  . Anxiety   . Depression   . Irregular menstrual bleeding 01/31/2014   Past Surgical History  Procedure Laterality Date  . Tympanostomy tube placement    . Cyst removal neck    . Tympanostomy tube placement    . Cholecystectomy     Family History  Problem Relation Age of Onset  . Bipolar disorder Maternal Grandmother   . Heart disease Maternal Grandfather   . Thyroid disease Paternal Grandmother   . Arthritis Mother   . Fibromyalgia Mother   . Birth defects Father    Social History  Substance Use Topics  . Smoking status: Current Every Day Smoker -- 1.00 packs/day for 4 years    Types: Cigarettes  . Smokeless tobacco: Never Used  . Alcohol Use: No     Comment: socially   OB History    Gravida Para Term Preterm AB TAB SAB Ectopic Multiple Living   Review of Systems  Constitutional: Negative for fever and chills.  Cardiovascular:  Negative for chest pain.  Gastrointestinal: Negative for nausea, vomiting and abdominal pain.  Genitourinary: Negative for dysuria and difficulty urinating.  Musculoskeletal: Positive for arthralgias (intermittent pain to both hands, toes and hips.). Negative for joint swelling and neck pain.  Skin: Negative for color change and wound.  Neurological: Negative for dizziness, weakness, numbness and headaches.  All other systems reviewed and are negative.     Allergies  Review of patient's allergies indicates no known allergies.  Home Medications   Prior to Admission medications   Medication Sig Start Date End Date Taking? Authorizing Shamia Uppal  methocarbamol (ROBAXIN) 500 MG tablet Take 1 tablet (500 mg total) by mouth 3 (three) times daily. 03/14/15   Tammy Triplett, PA-C  naproxen (NAPROSYN) 500 MG tablet Take 1 tablet (500 mg total) by mouth 2 (two) times daily with a meal. 03/14/15   Tammy Triplett, PA-C   BP 148/99 mmHg  Pulse 101  Temp(Src) 98 F (36.7 C) (Oral)  Resp 16  Ht  (1.702 m)  Wt 240 lb (108.863 kg)  BMI 37.58 kg/m2  SpO2 100% Physical Exam  Constitutional: She is oriented to person, place, and time. She appears well-developed and well-nourished. No distress.  HENT:  Head: Normocephalic and atraumatic.  Mouth/Throat: Oropharynx is clear and moist.  Neck: Normal range of motion. Neck supple.  Cardiovascular: Normal rate, regular rhythm and intact distal pulses.   No murmur heard. Pulmonary/Chest: Effort normal and breath sounds normal. No respiratory distress.  Abdominal: Soft. She exhibits no distension. There is no tenderness.  Musculoskeletal: Normal range of motion.  Lymphadenopathy:    She has no cervical adenopathy.  Neurological: She is alert and oriented to person, place, and time. No sensory deficit. She exhibits normal muscle tone. Coordination and gait normal.  Reflex Scores:      Tricep reflexes are 2+ on the right side and 2+ on the left  side.      Bicep reflexes are 2+ on the right side and 2+ on the left side.      Patellar reflexes are 2+ on the right side and 2+ on the left side.      Achilles reflexes are 2+ on the right side and 2+ on the left side. Skin: Skin is warm and dry. No rash noted.  Psychiatric: She has a normal mood and affect.  Nursing note and vitals reviewed.   ED Course  Procedures (including critical care time) Labs Review Labs Reviewed  URINALYSIS, ROUTINE W REFLEX MICROSCOPIC (NOT AT [facility]) - Abnormal; Notable for the following:    Specific Gravity, Urine >1.030 (*)    All other components within normal limits  I-STAT CHEM 8, ED - Abnormal; Notable for the following:    Glucose, Bld 176 (*)    All other components within normal limits  POC URINE PREG, ED    Imaging Review No results found. I have personally reviewed and evaluated these images and lab results as part of my medical decision-making.   EKG Interpretation None      MDM   Final diagnoses:  Spasm of muscle    Pt is well appearing.  No focal neuro or motor deficits on exam.  NV intact.  Ambulates in the dept with a steady gait.  Sx's began after starting a new job that requires repetitive movements and standing.  Sx's are likely related to muscle spasms.  Labs are reassuring.  She appears stable for d/c.  And agrees to symptomatic tx with muscle relaxer and NSAID.  Advised to f/u with her PMD     Pauline Aus, PA-C 03/14/15 2342  Gilda Crease, MD 03/16/15 [phone]

## 2016-03-08 ENCOUNTER — Emergency Department (HOSPITAL_COMMUNITY)
Admission: EM | Admit: 2016-03-08 | Discharge: 2016-03-09 | Disposition: A | Discharge status: Home / Self Care | Payer: Self-pay | Attending: Emergency Medicine | Admitting: Emergency Medicine

## 2016-03-08 ENCOUNTER — Emergency Department (HOSPITAL_COMMUNITY): Payer: Self-pay

## 2016-03-08 ENCOUNTER — Encounter (HOSPITAL_COMMUNITY): Payer: Self-pay | Admitting: *Deleted

## 2016-03-08 DIAGNOSIS — Z79899 Other long term (current) drug therapy: Secondary | ICD-10-CM | POA: Insufficient documentation

## 2016-03-08 DIAGNOSIS — J069 Acute upper respiratory infection, unspecified: Secondary | ICD-10-CM | POA: Insufficient documentation

## 2016-03-08 DIAGNOSIS — F17210 Nicotine dependence, cigarettes, uncomplicated: Secondary | ICD-10-CM | POA: Insufficient documentation

## 2016-03-08 DIAGNOSIS — B349 Viral infection, unspecified: Secondary | ICD-10-CM | POA: Insufficient documentation

## 2016-03-08 NOTE — ED Triage Notes (Signed)
Pt reports fevers since yesterday. Generalized body aches and headaches for 3 days.

## 2016-03-09 MED ORDER — PROMETHAZINE-CODEINE 6.25-10 MG/5ML PO SYRP: 5.0000 mL | ORAL_SOLUTION | ORAL | 0 refills | Status: DC | PRN

## 2016-03-09 MED ORDER — MAGNESIUM CITRATE PO SOLN
ORAL | Status: AC
  Filled 2016-03-09: qty 296

## 2016-03-09 NOTE — ED Notes (Signed)
Mag citrate over rode on wrong patient, pt did not receive Mg citrate for home use

## 2016-03-10 NOTE — ED Provider Notes (Signed)
[facility]EM[facility]ENCY DEPT Provider Note   CSN: [id number] Arrival date & time: 03/08/16  2048     History   Chief Complaint Chief Complaint  Patient presents with  . Generalized Body Aches    HPI [name] is a 20 y.o. female presenting with a one day history of generalized body aches along with headache, subjective fevers, nasal congestion with clear rhinorrhea.  Symptoms do not include sore throat, shortness of breath, chest pain, nausea, vomiting or diarrhea.  Additionally she denies dizziness, neck pain, rash.  The patient has taken tylenol cold formula prior to arrival with no significant improvement in symptoms. .  The history is provided by the patient.    Past Medical History:  Diagnosis Date  . Anxiety   . Depression   . Irregular menstrual bleeding 01/31/2014    Patient Active Problem List   Diagnosis Date Noted  . Irregular menstrual bleeding 01/31/2014  . Depression 05/07/2013  . Smoker 05/07/2013    Past Surgical History:  Procedure Laterality Date  . CHOLECYSTECTOMY    . CYST REMOVAL NECK    . TYMPANOSTOMY TUBE PLACEMENT    . TYMPANOSTOMY TUBE PLACEMENT      OB History    Gravida Para Term Preterm AB Living   1 1 1     1    SAB TAB Ectopic Multiple Live Births           1       Home Medications    Prior to Admission medications   Medication Sig Start Date End Date Taking? Authorizing Provider  Pseudoeph-CPM-DM-APAP (TYLENOL COLD) 30-2-15-325 MG TABS Take 1-2 tablets by mouth daily as needed (for cold/pain).   Yes Historical Provider, MD  promethazine-codeine (PHENERGAN WITH CODEINE) 6.25-10 MG/5ML syrup Take 5 mLs by mouth every 4 (four) hours as needed for cough (and body aches). 03/09/16   Burgess Amor, PA-C    Family History Family History  Problem Relation Age of Onset  . Bipolar disorder Maternal Grandmother   . Heart disease Maternal Grandfather   . Thyroid disease Paternal Grandmother   . Arthritis Mother   . Fibromyalgia Mother   .  Birth defects Father     Social History Social History  Substance Use Topics  . Smoking status: Current Every Day Smoker    Packs/day: 1.00    Years: 4.00    Types: Cigarettes  . Smokeless tobacco: Never Used  . Alcohol use No     Comment: socially     Allergies   Review of patient's allergies indicates no known allergies.   Review of Systems Review of Systems  Constitutional: Positive for fever. Negative for chills.  HENT: Positive for congestion and rhinorrhea. Negative for ear pain, sinus pressure, sore throat, trouble swallowing and voice change.   Eyes: Negative for discharge.  Respiratory: Negative for cough, shortness of breath, wheezing and stridor.   Cardiovascular: Negative for chest pain.  Gastrointestinal: Negative for abdominal pain.  Genitourinary: Negative.      Physical Exam Updated Vital Signs BP 138/86 (BP Location: Right Arm)   Pulse 102   Temp 98.7 F (37.1 C) (Oral)   Resp 20   Ht 5\' 7"  (1.702 m)   Wt 117.9 kg   SpO2 100%   BMI 40.72 kg/m   Physical Exam  Constitutional: She is oriented to person, place, and time. She appears well-developed and well-nourished.  HENT:  Head: Normocephalic and atraumatic.  Right Ear: Tympanic membrane and ear canal normal.  Left  Ear: Tympanic membrane and ear canal normal.  Nose: Mucosal edema and rhinorrhea present.  Mouth/Throat: Uvula is midline, oropharynx is clear and moist and mucous membranes are normal. No oropharyngeal exudate, posterior oropharyngeal edema, posterior oropharyngeal erythema or tonsillar abscesses.  Eyes: Conjunctivae are normal.  Cardiovascular: Normal rate and normal heart sounds.   Pulmonary/Chest: Effort normal. No respiratory distress. She has no wheezes. She has no rales.  Abdominal: Soft. There is no tenderness.  Musculoskeletal: Normal range of motion.  Neurological: She is alert and oriented to person, place, and time.  Skin: Skin is warm and dry. No rash noted.    Psychiatric: She has a normal mood and affect.     ED Treatments / Results  Labs (all labs ordered are listed, but only abnormal results are displayed) Labs Reviewed - No data to display  EKG  EKG Interpretation None       Radiology Dg Chest 2 View  Result Date: 03/09/2016 CLINICAL DATA:  Acute onset of mild dry cough, shortness of breath, fever and body aches. Initial encounter. EXAM: CHEST  2 VIEW COMPARISON:  Chest radiograph performed 06/30/2015 FINDINGS: The lungs are well-aerated and clear. There is no evidence of focal opacification, pleural effusion or pneumothorax. The heart is normal in size; the mediastinal contour is within normal limits. No acute osseous abnormalities are seen. Clips are noted within the right upper quadrant, reflecting prior cholecystectomy. IMPRESSION: No acute cardiopulmonary process seen. Electronically Signed   By: Roanna RaiderJeffery  Chang M.D.   On: 03/09/2016 00:55    Procedures Procedures (including critical care time)  Medications Ordered in ED Medications - No data to display   Initial Impression / Assessment and Plan / ED Course  I have reviewed the triage vital signs and the nursing notes.  Pertinent labs & imaging results that were available during my care of the patient were reviewed by me and considered in my medical decision making (see chart for details).  Clinical Course    Prn f/u. Rest, increased fluids intake, f/u with pcp for persistent or worsened sx.  Final Clinical Impressions(s) / ED Diagnoses   Final diagnoses:  Viral syndrome  URI (upper respiratory infection)    New Prescriptions Discharge Medication List as of 03/09/2016  1:09 AM    START taking these medications   Details  promethazine-codeine (PHENERGAN WITH CODEINE) 6.25-10 MG/5ML syrup Take 5 mLs by mouth every 4 (four) hours as needed for cough (and body aches)., Starting Tue 03/09/2016, Print         Burgess AmorJulie Vassie Kugel, PA-C 03/10/16 0013    Shaune Pollackameron Isaacs,  MD 03/10/16 1144

## 2016-03-14 ENCOUNTER — Emergency Department (HOSPITAL_COMMUNITY): Payer: Self-pay

## 2016-03-14 ENCOUNTER — Encounter (HOSPITAL_COMMUNITY): Payer: Self-pay | Admitting: *Deleted

## 2016-03-14 ENCOUNTER — Emergency Department (HOSPITAL_COMMUNITY)
Admission: EM | Admit: 2016-03-14 | Discharge: 2016-03-14 | Disposition: A | Discharge status: Home / Self Care | Payer: Self-pay | Attending: Emergency Medicine | Admitting: Emergency Medicine

## 2016-03-14 DIAGNOSIS — R112 Nausea with vomiting, unspecified: Secondary | ICD-10-CM | POA: Insufficient documentation

## 2016-03-14 DIAGNOSIS — R519 Headache, unspecified: Secondary | ICD-10-CM

## 2016-03-14 DIAGNOSIS — R51 Headache: Secondary | ICD-10-CM | POA: Insufficient documentation

## 2016-03-14 DIAGNOSIS — F17210 Nicotine dependence, cigarettes, uncomplicated: Secondary | ICD-10-CM | POA: Insufficient documentation

## 2016-03-14 LAB — CBC WITH DIFFERENTIAL/PLATELET
Basophils Absolute: 0.1 10*3/uL (ref 0.0–0.1)
Basophils Relative: 1 %
EOS ABS: 0.1 10*3/uL (ref 0.0–0.7)
EOS PCT: 1 %
HEMATOCRIT: 36.3 % (ref 36.0–46.0)
Hemoglobin: 12.6 g/dL (ref 12.0–15.0)
LYMPHS ABS: 2.9 10*3/uL (ref 0.7–4.0)
LYMPHS PCT: 49 %
MCH: 30.4 pg (ref 26.0–34.0)
MCHC: 34.7 g/dL (ref 30.0–36.0)
MCV: 87.7 fL (ref 78.0–100.0)
MONO ABS: 0.5 10*3/uL (ref 0.1–1.0)
MONOS PCT: 8 %
Neutro Abs: 2.4 10*3/uL (ref 1.7–7.7)
Neutrophils Relative %: 41 %
PLATELETS: 171 10*3/uL (ref 150–400)
RBC: 4.14 MIL/uL (ref 3.87–5.11)
RDW: 13.5 % (ref 11.5–15.5)
WBC: 5.9 10*3/uL (ref 4.0–10.5)

## 2016-03-14 LAB — COMPREHENSIVE METABOLIC PANEL
ALT: 49 U/L (ref 14–54)
ANION GAP: 7 (ref 5–15)
AST: 39 U/L (ref 15–41)
Albumin: 3.6 g/dL (ref 3.5–5.0)
Alkaline Phosphatase: 86 U/L (ref 38–126)
BILIRUBIN TOTAL: 0.6 mg/dL (ref 0.3–1.2)
BUN: 6 mg/dL (ref 6–20)
CO2: 24 mmol/L (ref 22–32)
Calcium: 8.6 mg/dL — ABNORMAL LOW (ref 8.9–10.3)
Chloride: 104 mmol/L (ref 101–111)
Creatinine, Ser: 0.8 mg/dL (ref 0.44–1.00)
Glucose, Bld: 123 mg/dL — ABNORMAL HIGH (ref 65–99)
POTASSIUM: 3.4 mmol/L — AB (ref 3.5–5.1)
Sodium: 135 mmol/L (ref 135–145)
TOTAL PROTEIN: 7 g/dL (ref 6.5–8.1)

## 2016-03-14 MED ORDER — SODIUM CHLORIDE 0.9 % IV BOLUS (SEPSIS)
1000.0000 mL | Freq: Once | INTRAVENOUS | Status: AC
Start: 2016-03-14 — End: 2016-03-14
  Administered 2016-03-14: 1000 mL via INTRAVENOUS

## 2016-03-14 MED ORDER — DEXAMETHASONE SODIUM PHOSPHATE 10 MG/ML IJ SOLN
10.0000 mg | Freq: Once | INTRAMUSCULAR | Status: AC
  Administered 2016-03-14: 10 mg via INTRAVENOUS
  Filled 2016-03-14: qty 1

## 2016-03-14 MED ORDER — BUTALBITAL-APAP-CAFF-COD 50-325-40-30 MG PO CAPS: 1.0000 | ORAL_CAPSULE | Freq: Four times a day (QID) | ORAL | 0 refills | Status: DC | PRN

## 2016-03-14 MED ORDER — FENTANYL CITRATE (PF) 100 MCG/2ML IJ SOLN
25.0000 ug | Freq: Once | INTRAMUSCULAR | Status: AC
  Administered 2016-03-14: 25 ug via INTRAVENOUS
  Filled 2016-03-14: qty 2

## 2016-03-14 MED ORDER — PROCHLORPERAZINE EDISYLATE 5 MG/ML IJ SOLN
10.0000 mg | Freq: Once | INTRAMUSCULAR | Status: AC
  Administered 2016-03-14: 10 mg via INTRAVENOUS
  Filled 2016-03-14: qty 2

## 2016-03-14 MED ORDER — KETOROLAC TROMETHAMINE 30 MG/ML IJ SOLN
30.0000 mg | Freq: Once | INTRAMUSCULAR | Status: AC
  Administered 2016-03-14: 30 mg via INTRAVENOUS
  Filled 2016-03-14: qty 1

## 2016-03-14 NOTE — ED Notes (Signed)
MD at bedside. 

## 2016-03-14 NOTE — Discharge Instructions (Signed)
As discussed, your evaluation today has been largely reassuring.  But, it is important that you monitor your condition carefully, and do not hesitate to return to the ED if you develop new, or concerning changes in your condition. ? ?Otherwise, please follow-up with your physician for appropriate ongoing care. ? ?

## 2016-03-14 NOTE — ED Notes (Signed)
Pt verbalized understanding of no driving and to use caution within 4 hours of taking pain meds due to meds cause drowsiness 

## 2016-03-14 NOTE — ED Provider Notes (Signed)
[facility] DEPT Provider Note   CSN: [id number] Arrival date & time: 03/14/16  1930 By signing my name below, I, Bridgette Habermann, attest that this documentation has been prepared under the direction and in the presence of Gerhard Munch, MD. Electronically Signed: Bridgette Habermann, ED Scribe. 03/14/16. 8:19 PM.  History   Chief Complaint Chief Complaint  Patient presents with  . Fever   HPI Comments: [name] is a 20 y.o. female who presents to the Emergency Department complaining of 9/10 headache onset 9 days ago. Pt also has associated intermittent fever (Tmax 105.5), "room-spinning" dizziness, The headache radiates from the neck superiorly across the head towards the eyes, decreased appetite, and vomiting. Pt took an Aleve, Ibuprofen, and Tylenol with no relief. Pt was here on 9/25 for her symptoms and was prescribed Phenergan which she has not been able to fill. She denies blurry vision or any other associated symptoms. She specifically denies confusion, disorientation vision loss, asymmetric weakness, incontinence, falling.  The history is provided by the patient. No language interpreter was used.    Past Medical History:  Diagnosis Date  . Anxiety   . Depression   . Irregular menstrual bleeding 01/31/2014    Patient Active Problem List   Diagnosis Date Noted  . Irregular menstrual bleeding 01/31/2014  . Depression 05/07/2013  . Smoker 05/07/2013    Past Surgical History:  Procedure Laterality Date  . CHOLECYSTECTOMY    . CYST REMOVAL NECK    . TYMPANOSTOMY TUBE PLACEMENT    . TYMPANOSTOMY TUBE PLACEMENT      OB History    Gravida Para Term Preterm AB Living   1 1 1     1    SAB TAB Ectopic Multiple Live Births           1       Home Medications    Prior to Admission medications   Medication Sig Start Date End Date Taking? Authorizing Provider  promethazine-codeine (PHENERGAN WITH CODEINE) 6.25-10 MG/5ML syrup Take 5 mLs by mouth every 4 (four) hours as needed  for cough (and body aches). 03/09/16   Burgess Amor, PA-C  Pseudoeph-CPM-DM-APAP (TYLENOL COLD) 30-2-15-325 MG TABS Take 1-2 tablets by mouth daily as needed (for cold/pain).    Historical Provider, MD    Family History Family History  Problem Relation Age of Onset  . Bipolar disorder Maternal Grandmother   . Heart disease Maternal Grandfather   . Thyroid disease Paternal Grandmother   . Arthritis Mother   . Fibromyalgia Mother   . Birth defects Father     Social History Social History  Substance Use Topics  . Smoking status: Current Every Day Smoker    Packs/day: 1.00    Years: 4.00    Types: Cigarettes  . Smokeless tobacco: Never Used  . Alcohol use No     Comment: socially     Allergies   Review of patient's allergies indicates no known allergies.   Review of Systems Review of Systems  Constitutional:       Per HPI, otherwise negative  HENT:       Per HPI, otherwise negative  Respiratory:       Per HPI, otherwise negative  Cardiovascular:       Per HPI, otherwise negative  Gastrointestinal: Positive for nausea and vomiting.  Endocrine:       Negative aside from HPI  Genitourinary:       Neg aside from HPI   Musculoskeletal:  Per HPI, otherwise negative  Skin: Negative.   Neurological: Positive for weakness and headaches. Negative for syncope.     Physical Exam Updated Vital Signs BP 144/85 (BP Location: Right Arm)   Pulse 120   Temp 98.6 F (37 C) (Oral)   Resp 20   SpO2 100%   Physical Exam  Constitutional: She is oriented to person, place, and time. She appears well-developed and well-nourished. No distress.  HENT:  Head: Normocephalic and atraumatic.  Eyes: Conjunctivae and EOM are normal.  Neck: No spinous process tenderness and no muscular tenderness present. No neck rigidity. No edema, no erythema and normal range of motion present. No Kernig's sign noted. No thyromegaly present.  Cardiovascular: Normal rate and regular rhythm.     Pulmonary/Chest: Effort normal and breath sounds normal. No stridor. No respiratory distress.  Abdominal: She exhibits no distension.  Musculoskeletal: She exhibits no edema.  Neurological: She is alert and oriented to person, place, and time. She displays no atrophy and no tremor. No cranial nerve deficit or sensory deficit. She exhibits normal muscle tone. She displays no seizure activity. Coordination normal.  Skin: Skin is warm and dry.  Psychiatric: She has a normal mood and affect.  Nursing note and vitals reviewed.    ED Treatments / Results  DIAGNOSTIC STUDIES: Oxygen Saturation is 100% on RA, normal by my interpretation.    COORDINATION OF CARE: 8:17 PM Discussed treatment plan with pt at bedside which includes blood work, x-ray, urinalysis and pt agreed to plan.  Labs (all labs ordered are listed, but only abnormal results are displayed) Labs Reviewed  COMPREHENSIVE METABOLIC PANEL - Abnormal; Notable for the following:       Result Value   Potassium 3.4 (*)    Glucose, Bld 123 (*)    Calcium 8.6 (*)    All other components within normal limits  CBC WITH DIFFERENTIAL/PLATELET  URINALYSIS, ROUTINE W REFLEX MICROSCOPIC (NOT [facility])  PREGNANCY, URINE     Radiology Dg Chest 2 View  Result Date: 03/14/2016 CLINICAL DATA:  Cough and shortness of breath EXAM: CHEST  2 VIEW COMPARISON:  03/08/2016 FINDINGS: Mildly low volume chest. There is no edema, consolidation, effusion, or pneumothorax. Normal heart size and mediastinal contours. IMPRESSION: Negative chest. Electronically Signed   By: Marnee SpringJonathon  Watts M.D.   On: 03/14/2016 20:48   Ct Head Wo Contrast  Result Date: 03/14/2016 CLINICAL DATA:  20 year old female with atypical headache. EXAM: CT HEAD WITHOUT CONTRAST TECHNIQUE: Contiguous axial images were obtained from the base of the skull through the vertex without intravenous contrast. COMPARISON:  None. FINDINGS: Brain: No evidence of acute infarction, hemorrhage,  hydrocephalus, extra-axial collection or mass lesion/mass effect. Vascular: No hyperdense vessel or unexpected calcification. Skull: Normal. Negative for fracture or focal lesion. Sinuses/Orbits: No acute finding. Other: None IMPRESSION: No acute intracranial pathology. Electronically Signed   By: Elgie CollardArash  Radparvar M.D.   On: 03/14/2016 21:12    Procedures Procedures (including critical care time)  Medications Ordered in ED Medications  prochlorperazine (COMPAZINE) injection 10 mg (not administered)  dexamethasone (DECADRON) injection 10 mg (not administered)  sodium chloride 0.9 % bolus 1,000 mL (1,000 mLs Intravenous New Bag/Given 03/14/16 2040)  ketorolac (TORADOL) 30 MG/ML injection 30 mg (30 mg Intravenous Given 03/14/16 2040)     Initial Impression / Assessment and Plan / ED Course  I have reviewed the triage vital signs and the nursing notes.  Pertinent labs & imaging results that were available during my care of the  patient were reviewed by me and considered in my medical decision making (see chart for details).  Clinical Course    Chart review notable for visit last week, more consistent with viral illness, respiratory concerns. Patient has not filled provided medication since that visit.  10:10 PM Patient awake, alert, moving all extremity spontaneously, speaking with her friend, in no distress, still has headache. Aware of the initial results. Additional medications pending.   10:57 PM Patient much improved. I discussed the importance of following up with neurology, primary care.  Final Clinical Impressions(s) / ED Diagnoses   I personally performed the services described in this documentation, which was scribed in my presence. The recorded information has been reviewed and is accurate.    MDM female presents with concern of ongoing headache. Patient does describe fevers well, but has no fever here, has unremarkable vital signs. Patient has no neurologic deficits,  no evidence for meningitis, bacteremia, sepsis. Symptoms improved substantially with fluids, multiple medication. With reassuring physical exam, vitals, patient discharged to follow-up with neurology as an outpatient.    Gerhard Munch, MD 03/14/16 2258

## 2016-03-14 NOTE — ED Triage Notes (Signed)
Pt reports 105.5 temp earlier around 2 pm. Pt states after taking tylenol her temp was 102. Pt also took an aleve an hour ago. Pt also c/o sob, headache, rash, generalized body aches, n/v and decreased appetite. Pt did not get meds filled that were prescribed to her here several days ago.

## 2016-05-31 ENCOUNTER — Encounter (INDEPENDENT_AMBULATORY_CARE_PROVIDER_SITE_OTHER): Payer: Self-pay

## 2016-05-31 ENCOUNTER — Ambulatory Visit (INDEPENDENT_AMBULATORY_CARE_PROVIDER_SITE_OTHER): Payer: BLUE CROSS/BLUE SHIELD | Admitting: Women's Health

## 2016-05-31 ENCOUNTER — Encounter: Payer: Self-pay | Admitting: Women's Health

## 2016-05-31 VITALS — BP 132/84 | HR 76 | Ht 67.0 in | Wt 252.0 lb

## 2016-05-31 DIAGNOSIS — R252 Cramp and spasm: Secondary | ICD-10-CM | POA: Diagnosis not present

## 2016-05-31 DIAGNOSIS — F17210 Nicotine dependence, cigarettes, uncomplicated: Secondary | ICD-10-CM | POA: Diagnosis not present

## 2016-05-31 DIAGNOSIS — Z3A01 Less than 8 weeks gestation of pregnancy: Secondary | ICD-10-CM

## 2016-05-31 DIAGNOSIS — O3680X0 Pregnancy with inconclusive fetal viability, not applicable or unspecified: Secondary | ICD-10-CM

## 2016-05-31 DIAGNOSIS — R110 Nausea: Secondary | ICD-10-CM | POA: Diagnosis not present

## 2016-05-31 DIAGNOSIS — Z3201 Encounter for pregnancy test, result positive: Secondary | ICD-10-CM

## 2016-05-31 DIAGNOSIS — N644 Mastodynia: Secondary | ICD-10-CM | POA: Diagnosis not present

## 2016-05-31 DIAGNOSIS — Z3490 Encounter for supervision of normal pregnancy, unspecified, unspecified trimester: Secondary | ICD-10-CM | POA: Insufficient documentation

## 2016-05-31 LAB — POCT URINE PREGNANCY: Preg Test, Ur: POSITIVE — AB

## 2016-05-31 MED ORDER — DOXYLAMINE-PYRIDOXINE 10-10 MG PO TBEC: DELAYED_RELEASE_TABLET | ORAL | 6 refills | Status: DC

## 2016-05-31 NOTE — Patient Instructions (Signed)
Vitamin B6 (pyridoxine) 25mg  four times a day OR 50mg  twice a day Add Unisom (doxylamine) 12.5mg  (1/2 tab) in the morning, 12.5mg  6-8 hours later, then 25mg  every night if symptoms do not improve with Vitamin B6 alone   Call if not helping or you would rather have a prescription for phenergan  Nausea & Vomiting  Have saltine crackers or pretzels by your bed and eat a few bites before you raise your head out of bed in the morning  Eat small frequent meals throughout the day instead of large meals  Drink plenty of fluids throughout the day to stay hydrated, just don't drink a lot of fluids with your meals.  This can make your stomach fill up faster making you feel sick  Do not brush your teeth right after you eat  Products with real ginger are good for nausea, like ginger ale and ginger hard candy Make sure it says made with real ginger!  Sucking on sour candy like lemon heads is also good for nausea  If your prenatal vitamins make you nauseated, take them at night so you will sleep through the nausea  Sea Bands  If you feel like you need medicine for the nausea & vomiting please let us know  If you are unable to keep any fluids or food down please let us know   Constipation  Drink plenty of fluid, preferably water, throughout the day  Eat foods high in fiber such as fruits, vegetables, and grains  Exercise, such as walking, is a good way to keep your bowels regular  Drink warm fluids, especially warm prune juice, or decaf coffee  Eat a 1/2 cup of real oatmeal (not instant), 1/2 cup applesauce, and 1/2-1 cup warm prune juice every day  If needed, you may take Colace (docusate sodium) stool softener once or twice a day to help keep the stool soft. If you are pregnant, wait until you are out of your first trimester (12-14 weeks of pregnancy)  If you still are having problems with constipation, you may take Miralax once daily as needed to help keep your bowels regular.  If you are  pregnant, wait until you are out of your first trimester (12-14 weeks of pregnancy)   First Trimester of Pregnancy The first trimester of pregnancy is from week 1 until the end of week 12 (months 1 through 3). A week after a sperm fertilizes an egg, the egg will implant on the wall of the uterus. This embryo will begin to develop into a baby. Genes from you and your partner are forming the baby. The female genes determine whether the baby is a boy or a girl. At 6-8 weeks, the eyes and face are formed, and the heartbeat can be seen on ultrasound. At the end of 12 weeks, all the baby's organs are formed.  Now that you are pregnant, you will want to do everything you can to have a healthy baby. Two of the most important things are to get good prenatal care and to follow your health care provider's instructions. Prenatal care is all the medical care you receive before the baby's birth. This care will help prevent, find, and treat any problems during the pregnancy and childbirth. BODY CHANGES Your body goes through many changes during pregnancy. The changes vary from woman to woman.   You may gain or lose a couple of pounds at first.  You may feel sick to your stomach (nauseous) and throw up (vomit). If the vomiting  is uncontrollable, call your health care provider.  You may tire easily.  You may develop headaches that can be relieved by medicines approved by your health care provider.  You may urinate more often. Painful urination may mean you have a bladder infection.  You may develop heartburn as a result of your pregnancy.  You may develop constipation because certain hormones are causing the muscles that push waste through your intestines to slow down.  You may develop hemorrhoids or swollen, bulging veins (varicose veins).  Your breasts may begin to grow larger and become tender. Your nipples may stick out more, and the tissue that surrounds them (areola) may become darker.  Your gums may  bleed and may be sensitive to brushing and flossing.  Dark spots or blotches (chloasma, mask of pregnancy) may develop on your face. This will likely fade after the baby is born.  Your menstrual periods will stop.  You may have a loss of appetite.  You may develop cravings for certain kinds of food.  You may have changes in your emotions from day to day, such as being excited to be pregnant or being concerned that something may go wrong with the pregnancy and baby.  You may have more vivid and strange dreams.  You may have changes in your hair. These can include thickening of your hair, rapid growth, and changes in texture. Some women also have hair loss during or after pregnancy, or hair that feels dry or thin. Your hair will most likely return to normal after your baby is born. WHAT TO EXPECT AT YOUR PRENATAL VISITS During a routine prenatal visit:  You will be weighed to make sure you and the baby are growing normally.  Your blood pressure will be taken.  Your abdomen will be measured to track your baby's growth.  The fetal heartbeat will be listened to starting around week 10 or 12 of your pregnancy.  Test results from any previous visits will be discussed. Your health care provider may ask you:  How you are feeling.  If you are feeling the baby move.  If you have had any abnormal symptoms, such as leaking fluid, bleeding, severe headaches, or abdominal cramping.  If you have any questions. Other tests that may be performed during your first trimester include:  Blood tests to find your blood type and to check for the presence of any previous infections. They will also be used to check for low iron levels (anemia) and Rh antibodies. Later in the pregnancy, blood tests for diabetes will be done along with other tests if problems develop.  Urine tests to check for infections, diabetes, or protein in the urine.  An ultrasound to confirm the proper growth and development of  the baby.  An amniocentesis to check for possible genetic problems.  Fetal screens for spina bifida and Down syndrome.  You may need other tests to make sure you and the baby are doing well. HOME CARE INSTRUCTIONS  Medicines  Follow your health care provider's instructions regarding medicine use. Specific medicines may be either safe or unsafe to take during pregnancy.  Take your prenatal vitamins as directed.  If you develop constipation, try taking a stool softener if your health care provider approves. Diet  Eat regular, well-balanced meals. Choose a variety of foods, such as meat or vegetable-based protein, fish, milk and low-fat dairy products, vegetables, fruits, and whole grain breads and cereals. Your health care provider will help you determine the amount of weight gain  that is right for you.  Avoid raw meat and uncooked cheese. These carry germs that can cause birth defects in the baby.  Eating four or five small meals rather than three large meals a day may help relieve nausea and vomiting. If you start to feel nauseous, eating a few soda crackers can be helpful. Drinking liquids between meals instead of during meals also seems to help nausea and vomiting.  If you develop constipation, eat more high-fiber foods, such as fresh vegetables or fruit and whole grains. Drink enough fluids to keep your urine clear or pale yellow. Activity and Exercise  Exercise only as directed by your health care provider. Exercising will help you:  Control your weight.  Stay in shape.  Be prepared for labor and delivery.  Experiencing pain or cramping in the lower abdomen or low back is a good sign that you should stop exercising. Check with your health care provider before continuing normal exercises.  Try to avoid standing for long periods of time. Move your legs often if you must stand in one place for a long time.  Avoid heavy lifting.  Wear low-heeled shoes, and practice good  posture.  You may continue to have sex unless your health care provider directs you otherwise. Relief of Pain or Discomfort  Wear a good support bra for breast tenderness.   Take warm sitz baths to soothe any pain or discomfort caused by hemorrhoids. Use hemorrhoid cream if your health care provider approves.   Rest with your legs elevated if you have leg cramps or low back pain.  If you develop varicose veins in your legs, wear support hose. Elevate your feet for 15 minutes, 3-4 times a day. Limit salt in your diet. Prenatal Care  Schedule your prenatal visits by the twelfth week of pregnancy. They are usually scheduled monthly at first, then more often in the last 2 months before delivery.  Write down your questions. Take them to your prenatal visits.  Keep all your prenatal visits as directed by your health care provider. Safety  Wear your seat belt at all times when driving.  Make a list of emergency phone numbers, including numbers for family, friends, the hospital, and police and fire departments. General Tips  Ask your health care provider for a referral to a local prenatal education class. Begin classes no later than at the beginning of month 6 of your pregnancy.  Ask for help if you have counseling or nutritional needs during pregnancy. Your health care provider can offer advice or refer you to specialists for help with various needs.  Do not use hot tubs, steam rooms, or saunas.  Do not douche or use tampons or scented sanitary pads.  Do not cross your legs for long periods of time.  Avoid cat litter boxes and soil used by cats. These carry germs that can cause birth defects in the baby and possibly loss of the fetus by miscarriage or stillbirth.  Avoid all smoking, herbs, alcohol, and medicines not prescribed by your health care provider. Chemicals in these affect the formation and growth of the baby.  Schedule a dentist appointment. At home, brush your teeth with  a soft toothbrush and be gentle when you floss. SEEK MEDICAL CARE IF:   You have dizziness.  You have mild pelvic cramps, pelvic pressure, or nagging pain in the abdominal area.  You have persistent nausea, vomiting, or diarrhea.  You have a bad smelling vaginal discharge.  You have pain with urination.  You notice increased swelling in your face, hands, legs, or ankles. SEEK IMMEDIATE MEDICAL CARE IF:   You have a fever.  You are leaking fluid from your vagina.  You have spotting or bleeding from your vagina.  You have severe abdominal cramping or pain.  You have rapid weight gain or loss.  You vomit blood or material that looks like coffee grounds.  You are exposed to [place] measles and have never had them.  You are exposed to fifth disease or chickenpox.  You develop a severe headache.  You have shortness of breath.  You have any kind of trauma, such as from a fall or a car accident. Document Released: 05/25/2001 Document Revised: 10/15/2013 Document Reviewed: 04/10/2013 [facility] Patient Information 2015 [place], [place]. This information is not intended to replace advice given to you by your health care provider. Make sure you discuss any questions you have with your health care provider.

## 2016-05-31 NOTE — Progress Notes (Signed)
   [facility] Visit  Patient name: [name] MRN [id number]  Date of birth: Mar 02, 1996  CC & HPI:  [name] is a 20 y.o. 61P1001 Caucasian female presenting today for multiple +PT at home. Had Liletta IUD placed 04/10/14, hasn't checked strings in a long time- used to be able to feel them. When had +PT, did check for strings, but was unable to feel them. Was not having periods w/ IUD. Had 1wk of bleeding that started on 04/04/16 w/ cramping. Then began w/ nausea and breast tenderness ~1wk ago. Has already started taking pnv. Not taking any other meds. Smokes 1ppd. Requests meds for nausea.  Patient's last menstrual period was 04/04/2016.  Pertinent History Reviewed:  Medical & Surgical Hx:   Past medical, surgical, family, and social history reviewed in electronic medical record Medications: Reviewed & Updated - see associated section Allergies: Reviewed in electronic medical record  Objective Findings:  Vitals: BP 132/84 (BP Location: Right Arm, Patient Position: Sitting, Cuff Size: Large)   Pulse 76   Ht 5\' 7"  (1.702 m)   Wt 252 lb (114.3 kg)   LMP 04/04/2016   BMI 39.47 kg/m  Body mass index is 39.47 kg/m.  Physical Examination: General appearance - alert, well appearing, and in no distress Pelvic - IUD strings not visible Transvaginal u/s by LHE: very small intrauterine GS, no IUD visualized  Results for orders placed or performed in visit on 05/31/16 (from the past 24 hour(s))  POCT urine pregnancy   Collection Time: 05/31/16 11:59 AM  Result Value Ref Range   Preg Test, Ur Positive (A) Negative     Assessment & Plan:  A:   Very early pregnancy   IUD expulsion vs. Extrauterine location  Smoker  Nausea    P:  Continue pnv  Rx diclegis, if not covered by insurance, gave printed otc unisom/vit b 6 directions, or can call for phenergan rx  Advised smoking cessation  Return in about 4 weeks (around 06/28/2016) for dating u/s and ob intake w/  tish.  Marge DuncansBooker, Sarea Fyfe Randall CNM, [facility]NP-BC 05/31/2016 1:07 PM

## 2016-06-14 NOTE — L&D Delivery Note (Signed)
  [name], [name] [[id number]]  Delivery Note At  a viable and healthy girl sex was delivered via  (Presentation: LOA ).  APGAR:8 ,9 ; weight 7lbs 8oz .   Placenta status: intact, .  Cord: 3 vessel with the following complications: postpartum hemorrhage  Treated with cytotec 800mcg   Anesthesia:  epidural Episiotomy:  n/a Lacerations:  2nd degree perineal Suture Repair: 3.0 vicryl Est. Blood Loss (mL):  1000  Mom to postpartum.  Baby to Couplet care / Skin to Skin.  [name] 01/25/2017, 10:17 AM

## 2016-06-29 ENCOUNTER — Ambulatory Visit (INDEPENDENT_AMBULATORY_CARE_PROVIDER_SITE_OTHER): Payer: BLUE CROSS/BLUE SHIELD

## 2016-06-29 ENCOUNTER — Encounter: Payer: Self-pay | Admitting: *Deleted

## 2016-06-29 ENCOUNTER — Ambulatory Visit (INDEPENDENT_AMBULATORY_CARE_PROVIDER_SITE_OTHER): Payer: BLUE CROSS/BLUE SHIELD | Admitting: *Deleted

## 2016-06-29 ENCOUNTER — Other Ambulatory Visit: Payer: Self-pay | Admitting: Women's Health

## 2016-06-29 VITALS — BP 112/68 | Wt 257.0 lb

## 2016-06-29 DIAGNOSIS — O3680X0 Pregnancy with inconclusive fetal viability, not applicable or unspecified: Secondary | ICD-10-CM

## 2016-06-29 DIAGNOSIS — O99211 Obesity complicating pregnancy, first trimester: Secondary | ICD-10-CM

## 2016-06-29 DIAGNOSIS — F17210 Nicotine dependence, cigarettes, uncomplicated: Secondary | ICD-10-CM | POA: Diagnosis not present

## 2016-06-29 DIAGNOSIS — Z3481 Encounter for supervision of other normal pregnancy, first trimester: Secondary | ICD-10-CM

## 2016-06-29 DIAGNOSIS — O99331 Smoking (tobacco) complicating pregnancy, first trimester: Secondary | ICD-10-CM

## 2016-06-29 DIAGNOSIS — Z3A09 9 weeks gestation of pregnancy: Secondary | ICD-10-CM

## 2016-06-29 DIAGNOSIS — Z1389 Encounter for screening for other disorder: Secondary | ICD-10-CM | POA: Diagnosis not present

## 2016-06-29 DIAGNOSIS — Z331 Pregnant state, incidental: Secondary | ICD-10-CM | POA: Diagnosis not present

## 2016-06-29 DIAGNOSIS — Z3682 Encounter for antenatal screening for nuchal translucency: Secondary | ICD-10-CM

## 2016-06-29 LAB — POCT URINALYSIS DIPSTICK
Blood, UA: NEGATIVE
Glucose, UA: NEGATIVE
KETONES UA: NEGATIVE
LEUKOCYTES UA: NEGATIVE
NITRITE UA: NEGATIVE

## 2016-06-29 NOTE — Progress Notes (Addendum)
US TA/TV 9+1 wks,single IUP w/ys,pos fht 173 bpm,normal ov's bilat,small subchorionic hemorrhage 1.6 x .9 x .5 cm,IUD was not visualized,EDD 01/31/2017 by ultrasound

## 2016-06-29 NOTE — Progress Notes (Addendum)
[name] is a 21 y.o. 802P1001 female here today for initial OB intake/educational visit with RN  Patient's medical, surgical, and obstetrical history obtained and reviewed.  Current medications and allergies also reviewed.   Dating ultrasound today revealed GA of [redacted]wk1d based on early U/S. EDC 01/31/17   BP 112/68   Wt 257 lb (116.6 kg)   LMP 04/04/2016 (Exact Date)   BMI 40.25 kg/m   Patient Active Problem List   Diagnosis Date Noted  . Encounter for supervision of other normal pregnancy 05/31/2016  . Irregular menstrual bleeding 01/31/2014  . Depression 05/07/2013  . Smoker 05/07/2013   Past Medical History:  Diagnosis Date  . Anxiety   . Depression   . Irregular menstrual bleeding 01/31/2014   Past Surgical History:  Procedure Laterality Date  . CHOLECYSTECTOMY    . CYST REMOVAL NECK    . TYMPANOSTOMY TUBE PLACEMENT    . TYMPANOSTOMY TUBE PLACEMENT     OB History    Gravida Para Term Preterm AB Living   2 1 1     1    SAB TAB Ectopic Multiple Live Births           1      She is not taking prenatal vitamins CCNC form completed PN1 labs drawn Baby scripts and mychart activated  Reviewed recommended weight gain based on pre-gravid BMI Smoking cessation instruction/counseling given:  counseled patient on the dangers of tobacco use, advised patient to stop smoking, and reviewed strategies to maximize success Currently smokes 1ppd. Trying to cut back. Refused Quit [place].  Genetic Screening discussed First Screen: requested Cystic fibrosis screening discussed declined  Face-to-face time at least 30 minutes. 50% or more of this visit was spent in counseling and coordination of care.  Return in about 3 weeks (around 07/20/2016) for US;NT+1stIT, New OB.   [name] RN-C 06/29/2016 9:42 AM    Attestation of supervision of visit:   Evaluation and management procedures were performed by [name], RN,  under my direct supervision and collaboration. I  have reviewed the Registered Nurse's note and chart, and I agree with the management and plan.    Attestation of supervision of visit:   Evaluation and management procedures were performed by [name], RN,  under my direct supervision and collaboration. I have reviewed the Registered Nurse's note and chart, and I agree with the management and plan.

## 2016-06-30 LAB — PMP SCREEN PROFILE (10S), URINE
Amphetamine Screen, Ur: NEGATIVE ng/mL
BARBITURATE SCRN UR: NEGATIVE ng/mL
BENZODIAZEPINE SCREEN, URINE: NEGATIVE ng/mL
COCAINE(METAB.) SCREEN, URINE: NEGATIVE ng/mL
Cannabinoids Ur Ql Scn: NEGATIVE ng/mL
Creatinine(Crt), U: 272.4 mg/dL (ref 20.0–300.0)
Methadone Scn, Ur: NEGATIVE ng/mL
OPIATE SCRN UR: NEGATIVE ng/mL
OXYCODONE+OXYMORPHONE UR QL SCN: NEGATIVE ng/mL
PCP Scrn, Ur: NEGATIVE ng/mL
Ph of Urine: 5.9 (ref 4.5–8.9)
Propoxyphene, Screen: NEGATIVE ng/mL

## 2016-06-30 LAB — MICROSCOPIC EXAMINATION
Casts: NONE SEEN /lpf
Epithelial Cells (non renal): >10 /hpf — AB (ref 0–10)

## 2016-06-30 LAB — HIV ANTIBODY (ROUTINE TESTING W REFLEX): HIV Screen 4th Generation wRfx: NONREACTIVE

## 2016-06-30 LAB — URINALYSIS, ROUTINE W REFLEX MICROSCOPIC
BILIRUBIN UA: NEGATIVE
GLUCOSE, UA: NEGATIVE
Ketones, UA: NEGATIVE
NITRITE UA: NEGATIVE
PH UA: 6 (ref 5.0–7.5)
RBC UA: NEGATIVE
Specific Gravity, UA: >=1.03 — AB (ref 1.005–1.030)
UUROB: 0.2 mg/dL (ref 0.2–1.0)

## 2016-06-30 LAB — CBC
HEMATOCRIT: 38.7 % (ref 34.0–46.6)
HEMOGLOBIN: 13.8 g/dL (ref 11.1–15.9)
MCH: 32 pg (ref 26.6–33.0)
MCHC: 35.7 g/dL (ref 31.5–35.7)
MCV: 90 fL (ref 79–97)
Platelets: 237 10*3/uL (ref 150–379)
RBC: 4.31 x10E6/uL (ref 3.77–5.28)
RDW: 13.3 % (ref 12.3–15.4)
WBC: 10.6 10*3/uL (ref 3.4–10.8)

## 2016-06-30 LAB — RUBELLA SCREEN: Rubella Antibodies, IGG: 4.89 index (ref >0.99)

## 2016-06-30 LAB — ABO/RH: Rh Factor: NEGATIVE

## 2016-06-30 LAB — VARICELLA ZOSTER ANTIBODY, IGG: VARICELLA: 214 {index} (ref >165)

## 2016-06-30 LAB — HEPATITIS B SURFACE ANTIGEN: Hepatitis B Surface Ag: NEGATIVE

## 2016-06-30 LAB — ANTIBODY SCREEN: ANTIBODY SCREEN: NEGATIVE

## 2016-06-30 LAB — RPR: RPR: NONREACTIVE

## 2016-07-01 ENCOUNTER — Encounter: Payer: Self-pay | Admitting: Advanced Practice Midwife

## 2016-07-01 ENCOUNTER — Other Ambulatory Visit: Payer: Self-pay | Admitting: Advanced Practice Midwife

## 2016-07-01 DIAGNOSIS — A749 Chlamydial infection, unspecified: Secondary | ICD-10-CM | POA: Insufficient documentation

## 2016-07-01 DIAGNOSIS — R030 Elevated blood-pressure reading, without diagnosis of hypertension: Secondary | ICD-10-CM | POA: Insufficient documentation

## 2016-07-01 LAB — GC/CHLAMYDIA PROBE AMP
Chlamydia trachomatis, NAA: POSITIVE — AB
Neisseria gonorrhoeae by PCR: NEGATIVE

## 2016-07-01 LAB — URINE CULTURE: ORGANISM ID, BACTERIA: NO GROWTH

## 2016-07-01 NOTE — Progress Notes (Signed)
+   CHL.  Several pharmacies in MyChart.  It is too late to call pt now, so message routed to nurse to find out correct pharmacy and partners information and a provider who is working tomorrow can call in meds. MyChart has not been activated by pt, but she had her OB intake, so will find out why it isn't active. CRESENZO-DISHMAN,Branon Sabine

## 2016-07-02 ENCOUNTER — Telehealth: Payer: Self-pay | Admitting: *Deleted

## 2016-07-02 DIAGNOSIS — A749 Chlamydial infection, unspecified: Secondary | ICD-10-CM

## 2016-07-02 MED ORDER — AZITHROMYCIN 500 MG PO TABS: 1000.0000 mg | ORAL_TABLET | Freq: Once | ORAL | 0 refills | Status: AC

## 2016-07-02 NOTE — Telephone Encounter (Signed)
-----   Message from Jacklyn ShellFrances Cresenzo-Dishmon, CNM sent at 07/01/2016 10:21 PM EST ----- She has chlamydia.  It is too late for me to call her tonight and her mychart isn't active. Also, there are 2 different pharmacies listed in epic. Can you call her and : tell her about chlamydia, get correct pharmacy, ask if she wants her partner treated (will need name, DOB, pharmacy, allergies), find out why her mychart wasn't activated when she came 3 days ago. Thank you!  Drenda FreezeFran

## 2016-07-02 NOTE — Telephone Encounter (Signed)
Spoke with pt letting her know CHL is +. Pt's partner info is: Aundria RudMatthew Singleton, DOB 09/03/91, No known allergies, [facility] . Please order med for both. Pt advised NO SEX until after POC appt, usually in 4 weeks! Thanks!! JSY

## 2016-07-06 ENCOUNTER — Telehealth: Payer: Self-pay | Admitting: Obstetrics & Gynecology

## 2016-07-06 NOTE — Telephone Encounter (Signed)
Pt called stating that she was given a prescription yesterday and started taking the medication today, pt states that after taking the medication she is cramping really bad. Pt is [redacted] weeks pregnant. Please contact pt

## 2016-07-06 NOTE — Telephone Encounter (Signed)
Pt called with complaints of severe cramping since taking the Azithromycin and had spotting last night. Informed patient cramping can be normal with the medication. I advised her if she started noticing heavy bleeding, she needed to be evaluated. Pt verbalized understanding.

## 2016-07-07 ENCOUNTER — Telehealth: Payer: Self-pay | Admitting: Advanced Practice Midwife

## 2016-07-07 MED ORDER — ACYCLOVIR 5 % EX OINT: 1.0000 "application " | TOPICAL_OINTMENT | CUTANEOUS | 2 refills | Status: DC

## 2016-07-07 NOTE — Telephone Encounter (Signed)
Pt informed med sent to pharmacy

## 2016-07-07 NOTE — Telephone Encounter (Signed)
Zovirax ointement sent

## 2016-07-08 ENCOUNTER — Telehealth: Payer: Self-pay | Admitting: Women's Health

## 2016-07-08 ENCOUNTER — Other Ambulatory Visit: Payer: Self-pay | Admitting: Advanced Practice Midwife

## 2016-07-14 ENCOUNTER — Other Ambulatory Visit: Payer: Self-pay | Admitting: Advanced Practice Midwife

## 2016-07-14 MED ORDER — ACYCLOVIR 400 MG PO TABS: 400.0000 mg | ORAL_TABLET | Freq: Three times a day (TID) | ORAL | 2 refills | Status: DC

## 2016-07-20 ENCOUNTER — Encounter: Payer: Self-pay | Admitting: Women's Health

## 2016-07-20 ENCOUNTER — Ambulatory Visit (INDEPENDENT_AMBULATORY_CARE_PROVIDER_SITE_OTHER): Payer: Medicaid Other

## 2016-07-20 ENCOUNTER — Ambulatory Visit (INDEPENDENT_AMBULATORY_CARE_PROVIDER_SITE_OTHER): Payer: Medicaid Other | Admitting: Women's Health

## 2016-07-20 ENCOUNTER — Other Ambulatory Visit (HOSPITAL_COMMUNITY)
Admission: RE | Admit: 2016-07-20 | Discharge: 2016-07-20 | Disposition: A | Discharge status: Home / Self Care | Payer: Medicaid Other | Source: Ambulatory Visit | Attending: Obstetrics & Gynecology | Admitting: Obstetrics & Gynecology

## 2016-07-20 VITALS — BP 122/86 | HR 76 | Wt 251.0 lb

## 2016-07-20 DIAGNOSIS — Z3481 Encounter for supervision of other normal pregnancy, first trimester: Secondary | ICD-10-CM

## 2016-07-20 DIAGNOSIS — Z1151 Encounter for screening for human papillomavirus (HPV): Secondary | ICD-10-CM | POA: Diagnosis present

## 2016-07-20 DIAGNOSIS — O360110 Maternal care for anti-D [Rh] antibodies, first trimester, not applicable or unspecified: Secondary | ICD-10-CM

## 2016-07-20 DIAGNOSIS — O209 Hemorrhage in early pregnancy, unspecified: Secondary | ICD-10-CM | POA: Insufficient documentation

## 2016-07-20 DIAGNOSIS — Z8659 Personal history of other mental and behavioral disorders: Secondary | ICD-10-CM

## 2016-07-20 DIAGNOSIS — Z2839 Other underimmunization status: Secondary | ICD-10-CM | POA: Insufficient documentation

## 2016-07-20 DIAGNOSIS — Z283 Underimmunization status: Secondary | ICD-10-CM

## 2016-07-20 DIAGNOSIS — Z01411 Encounter for gynecological examination (general) (routine) with abnormal findings: Secondary | ICD-10-CM | POA: Insufficient documentation

## 2016-07-20 DIAGNOSIS — Z1389 Encounter for screening for other disorder: Secondary | ICD-10-CM

## 2016-07-20 DIAGNOSIS — Z331 Pregnant state, incidental: Secondary | ICD-10-CM

## 2016-07-20 DIAGNOSIS — A749 Chlamydial infection, unspecified: Secondary | ICD-10-CM

## 2016-07-20 DIAGNOSIS — O09899 Supervision of other high risk pregnancies, unspecified trimester: Secondary | ICD-10-CM

## 2016-07-20 DIAGNOSIS — F17200 Nicotine dependence, unspecified, uncomplicated: Secondary | ICD-10-CM

## 2016-07-20 DIAGNOSIS — Z3682 Encounter for antenatal screening for nuchal translucency: Secondary | ICD-10-CM

## 2016-07-20 DIAGNOSIS — R030 Elevated blood-pressure reading, without diagnosis of hypertension: Secondary | ICD-10-CM

## 2016-07-20 DIAGNOSIS — O26899 Other specified pregnancy related conditions, unspecified trimester: Secondary | ICD-10-CM

## 2016-07-20 DIAGNOSIS — Z3A12 12 weeks gestation of pregnancy: Secondary | ICD-10-CM | POA: Diagnosis not present

## 2016-07-20 DIAGNOSIS — Z113 Encounter for screening for infections with a predominantly sexual mode of transmission: Secondary | ICD-10-CM | POA: Diagnosis present

## 2016-07-20 DIAGNOSIS — O99331 Smoking (tobacco) complicating pregnancy, first trimester: Secondary | ICD-10-CM

## 2016-07-20 DIAGNOSIS — Z124 Encounter for screening for malignant neoplasm of cervix: Secondary | ICD-10-CM

## 2016-07-20 DIAGNOSIS — Z6791 Unspecified blood type, Rh negative: Secondary | ICD-10-CM | POA: Insufficient documentation

## 2016-07-20 LAB — POCT URINALYSIS DIPSTICK
Glucose, UA: NEGATIVE
Ketones, UA: NEGATIVE
NITRITE UA: NEGATIVE
PROTEIN UA: NEGATIVE
RBC UA: NEGATIVE

## 2016-07-20 MED ORDER — RHO D IMMUNE GLOBULIN 1500 UNIT/2ML IJ SOSY
300.0000 ug | PREFILLED_SYRINGE | Freq: Once | INTRAMUSCULAR | Status: AC
  Administered 2016-07-20: 300 ug via INTRAMUSCULAR

## 2016-07-20 NOTE — Progress Notes (Signed)
Subjective:  [name] is a 21 y.o. G35P1001 Caucasian female at [redacted]w[redacted]d by 9wk u/s, being seen today for her first obstetrical visit.  Had intake visit w/ Tish, RN 06/29/16. Her obstetrical history is significant for smoker, +CT at intake visit was tx 1/19 has not had sex since, term uncomplicated svb x 1.  Pregnancy history fully reviewed.  Patient reports had vb 2 days ago 'enough to fill up a pad', then gradually lightened and hasn't had any since- was not r/t sex or bm. Denies vb, cramping, uti s/s, abnormal/malodorous vag d/c, or vulvovaginal itching/irritation.  BP 122/86   Pulse 76   Wt 251 lb (113.9 kg)   LMP 04/04/2016 (Exact Date)   BMI 39.31 kg/m   HISTORY: OB History  Gravida Para Term Preterm AB Living  2 1 1     1   SAB TAB Ectopic Multiple Live Births          1    # Outcome Date GA Lbr Len/2nd Weight Sex Delivery Anes PTL Lv  2 Current           1 Term 06/13/13 [redacted]w[redacted]d 26:18 / 06:10 8 lb 0.2 oz (3.635 kg) M Vag-Spont EPI N LIV     Birth Comments: caput     Past Medical History:  Diagnosis Date  . Anxiety   . Depression    post partum  . Irregular menstrual bleeding 01/31/2014   Past Surgical History:  Procedure Laterality Date  . CHOLECYSTECTOMY    . CYST REMOVAL NECK    . TYMPANOSTOMY TUBE PLACEMENT    . TYMPANOSTOMY TUBE PLACEMENT     Family History  Problem Relation Age of Onset  . Bipolar disorder Maternal Grandmother   . Heart disease Maternal Grandfather   . Thyroid disease Paternal Grandmother   . Arthritis Mother   . Fibromyalgia Mother   . Birth defects Father     Exam   System:     General: Well developed & nourished, no acute distress   Skin: Warm & dry, normal coloration and turgor, no rashes   Neurologic: Alert & oriented, normal mood   Cardiovascular: Regular rate & rhythm   Respiratory: Effort & rate normal, LCTAB, acyanotic   Abdomen: Soft, non tender   Extremities: normal strength, tone   Pelvic Exam:    Perineum: Normal  perineum   Vulva: Normal, no lesions   Vagina:  Normal mucosa, normal discharge, no bleeding prior to pap   Cervix: Normal, bulbous, appears closed   Uterus: Normal size/shape/contour for GA   Thin prep pap smear obtained w/ reflex high risk HPV cotesting FHR: + via u/s   Assessment:   Pregnancy: G2P1001 Patient Active Problem List   Diagnosis Date Noted  . Depression 05/07/2013    Priority: High  . Smoker 05/07/2013    Priority: High  . Chlamydia 07/01/2016  . Borderline high blood pressure 07/01/2016  . Encounter for supervision of other normal pregnancy 05/31/2016  . Irregular menstrual bleeding 01/31/2014    [redacted]w[redacted]d G2P1001 New OB visit VB first trimester Rh neg Smoker Recent CT+  Plan:  Initial labs obtained at intake visit Continue prenatal vitamins Problem list reviewed and updated Reviewed n/v relief measures and warning s/s to report Reviewed recommended weight gain based on pre-gravid BMI Encouraged well-balanced diet Genetic Screening discussed Integrated Screen: reviewed nt u/s today Cystic fibrosis screening discussed declined Ultrasound discussed; fetal survey: requested Follow up in 4 weeks for visit and 2nd IT CCNC completed  Rhogam given today for vb 2d ago CT POC from pap today Declines flu shot  Marge DuncansBooker, Miosotis Wetsel Randall CNM, [facility]NP-BC 07/20/2016 1:37 PM

## 2016-07-20 NOTE — Progress Notes (Signed)
US 12+1 wks,measurements c/w dates,normal ov's bilat,NB present,NT 1.8 mm,crl 58.8 mm

## 2016-07-20 NOTE — Patient Instructions (Signed)

## 2016-07-23 LAB — CYTOLOGY - PAP
Chlamydia: POSITIVE — AB
Diagnosis: UNDETERMINED — AB
HPV (WINDOPATH): DETECTED — AB
Neisseria Gonorrhea: NEGATIVE

## 2016-07-26 ENCOUNTER — Encounter: Payer: Self-pay | Admitting: Women's Health

## 2016-07-26 DIAGNOSIS — R87619 Unspecified abnormal cytological findings in specimens from cervix uteri: Secondary | ICD-10-CM | POA: Insufficient documentation

## 2016-07-26 LAB — MATERNAL SCREEN, INTEGRATED #1
Crown Rump Length: 58.8 mm
GEST. AGE ON COLLECTION DATE: 12.4 wk
Maternal Age at EDD: 23.4 years
Nuchal Translucency (NT): 1.8 mm
Number of Fetuses: 1
PAPP-A Value: 900.9 ng/mL
WEIGHT: 251 [lb_av]

## 2016-07-28 ENCOUNTER — Ambulatory Visit (INDEPENDENT_AMBULATORY_CARE_PROVIDER_SITE_OTHER): Payer: Medicaid Other | Admitting: Obstetrics and Gynecology

## 2016-07-28 ENCOUNTER — Encounter: Payer: Self-pay | Admitting: Obstetrics and Gynecology

## 2016-07-28 VITALS — BP 120/80 | HR 78 | Wt 250.0 lb

## 2016-07-28 DIAGNOSIS — F17210 Nicotine dependence, cigarettes, uncomplicated: Secondary | ICD-10-CM | POA: Diagnosis not present

## 2016-07-28 DIAGNOSIS — O99331 Smoking (tobacco) complicating pregnancy, first trimester: Secondary | ICD-10-CM | POA: Insufficient documentation

## 2016-07-28 DIAGNOSIS — Z3A13 13 weeks gestation of pregnancy: Secondary | ICD-10-CM

## 2016-07-28 DIAGNOSIS — R51 Headache: Secondary | ICD-10-CM | POA: Insufficient documentation

## 2016-07-28 DIAGNOSIS — O9989 Other specified diseases and conditions complicating pregnancy, childbirth and the puerperium: Secondary | ICD-10-CM | POA: Diagnosis not present

## 2016-07-28 DIAGNOSIS — O9229 Other disorders of breast associated with pregnancy and the puerperium: Secondary | ICD-10-CM

## 2016-07-28 DIAGNOSIS — N644 Mastodynia: Secondary | ICD-10-CM

## 2016-07-28 NOTE — ED Triage Notes (Signed)
Pt states she is approx [redacted] weeks pregnant, saw her ob/gyn today for a routine visit and told them of her c/o headache but did not mention the swelling in her lower legs.

## 2016-07-28 NOTE — Progress Notes (Signed)
G2P1001  Estimated Date of Delivery: 01/31/17 LROB 6026w2d  Chief Complaint  Patient presents with  . work-in-ob    c/o sharp and burining pain left breast  ____  Patient complaints:sharp left breast pain x 3 months but notes acute burning sensation that feels like it is coming from the inside of the breast. She also notes raised spots/rash to the outside of the breast.She has not been wearing a bra when at home due to the pain. She denies discharge from the nipple. Patient denies any bleeding , rupture of membranes,or regular contractions.  Blood pressure 120/80, pulse 78, weight 250 lb (113.4 kg), last menstrual period 04/04/2016.   Urine results:note done refer to the ob flow sheet for FH and FHR, ,                          Physical Examination: General appearance - alert, well appearing, and in no distress                                      Abdomen -  ,                                                         -FHR 150 bpm                                                         soft, nontender, nondistended, no masses or organomegaly                                      Pelvic - examination not indicated    Breasts: normal appearing breast tissue, 2 small areas of scarring s/p cystic acne. No masses redness or tenderness   .                               Questions were answered. Assessment: LROB G2P1001 @ 6826w2d                        Normal breast exam                       Mastalgia  Plan:  Continued routine obstetrical care  F/u in 4 weeks for routine OB care   By signing my name below, I, Freida Busmaniana Omoyeni, attest that this documentation has been prepared under the direction and in the presence of Tilda BurrowJohn V Aydon Swamy, MD . Electronically Signed: Freida Busmaniana Omoyeni, Scribe. 07/28/2016. 11:44 AM. I personally performed the services described in this documentation, which was SCRIBED in my presence. The recorded information has been reviewed and considered accurate. It has been edited as necessary  during review. Tilda BurrowFERGUSON,Dwaine Pringle V, MD

## 2016-07-29 ENCOUNTER — Encounter: Payer: Self-pay | Admitting: Obstetrics & Gynecology

## 2016-07-29 ENCOUNTER — Emergency Department (HOSPITAL_COMMUNITY)
Admission: EM | Admit: 2016-07-29 | Discharge: 2016-07-29 | Disposition: A | Discharge status: Home / Self Care | Payer: Medicaid Other | Attending: Emergency Medicine | Admitting: Emergency Medicine

## 2016-07-29 DIAGNOSIS — R51 Headache: Secondary | ICD-10-CM

## 2016-07-29 DIAGNOSIS — Z3491 Encounter for supervision of normal pregnancy, unspecified, first trimester: Secondary | ICD-10-CM

## 2016-07-29 DIAGNOSIS — R519 Headache, unspecified: Secondary | ICD-10-CM

## 2016-07-29 MED ORDER — SODIUM CHLORIDE 0.9 % IV BOLUS (SEPSIS)
1000.0000 mL | Freq: Once | INTRAVENOUS | Status: AC
  Administered 2016-07-29: 1000 mL via INTRAVENOUS

## 2016-07-29 MED ORDER — DIPHENHYDRAMINE HCL 50 MG/ML IJ SOLN
25.0000 mg | Freq: Once | INTRAMUSCULAR | Status: AC
  Administered 2016-07-29: 25 mg via INTRAVENOUS
  Filled 2016-07-29: qty 1

## 2016-07-29 MED ORDER — METOCLOPRAMIDE HCL 5 MG/ML IJ SOLN
10.0000 mg | Freq: Once | INTRAMUSCULAR | Status: AC
  Administered 2016-07-29: 10 mg via INTRAVENOUS
  Filled 2016-07-29: qty 2

## 2016-07-29 NOTE — ED Notes (Signed)
Pt verbalized understanding of discharge instructions. Signature pad not working. Pt ambulatory to waiting room.

## 2016-07-29 NOTE — ED Provider Notes (Signed)
[facility]-EMER[facility]ENCY DEPT Provider Note   CSN: [id number] Arrival date & time: 07/28/16  2307   By signing my name below, I, Cynda AcresHailei Fulton, attest that this documentation has been prepared under the direction and in the presence of Dione Boozeavid Marjory Meints, MD. Electronically Signed: Cynda AcresHailei Fulton, Scribe. 07/29/16. 12:49 AM.   History   Chief Complaint Chief Complaint  Patient presents with  . Headache    HPI Comments: [name] is a 21 y.o. female who presents to the Emergency Department complaining of a sudden-onset, constant headache that began 2 days ago. Patient has associated increased nausea, dizziness, leg swelling, and right eye pain. Patient describes her pain as dull, throbbing with a severity of 9/10. Patient states "my head feels like my brain is trying to come out of every hole of my body". Patient reports taking tylenol, lying down, sitting up, and taking hot/cold showers with no relief in pain. Nothing improves or worsens her pain. Patient denies any chills, diaphoresis, recent migraines, smell changes, or visual changes.   The history is provided by the patient. No language interpreter was used.    Past Medical History:  Diagnosis Date  . Anxiety   . Depression    post partum  . Irregular menstrual bleeding 01/31/2014    Patient Active Problem List   Diagnosis Date Noted  . Abnormal Pap smear of cervix 07/26/2016  . Vaginal bleeding in pregnancy, first trimester 07/20/2016  . Rh negative state in antepartum period 07/20/2016  . Susceptible to varicella (non-immune), currently pregnant 07/20/2016  . Chlamydia 07/01/2016  . Borderline high blood pressure 07/01/2016  . Encounter for supervision of other normal pregnancy 05/31/2016  . Irregular menstrual bleeding 01/31/2014  . History of depression 05/07/2013  . Smoker 05/07/2013    Past Surgical History:  Procedure Laterality Date  . CHOLECYSTECTOMY    . CYST REMOVAL NECK    . TYMPANOSTOMY TUBE PLACEMENT    .  TYMPANOSTOMY TUBE PLACEMENT      OB History    Gravida Para Term Preterm AB Living   2 1 1     1    SAB TAB Ectopic Multiple Live Births           1       Home Medications    Prior to Admission medications   Medication Sig Start Date End Date Taking? Authorizing Provider  acyclovir (ZOVIRAX) 400 MG tablet Take 1 tablet (400 mg total) by mouth 3 (three) times daily. Patient not taking: Reported on 07/28/2016 07/14/16   Jacklyn ShellFrances Cresenzo-Dishmon, CNM  butalbital-acetaminophen-caffeine (FIORICET WITH CODEINE) [phone] MG capsule Take 1 capsule by mouth every 6 (six) hours as needed for headache. Patient not taking: Reported on 06/29/2016 03/14/16   Gerhard Munchobert Lockwood, MD  Doxylamine-Pyridoxine (DICLEGIS) 10-10 MG TBEC 2 tabs q hs, if sx persist add 1 tab q am on day 3, if sx persist add 1 tab q afternoon on day 4 Patient not taking: Reported on 06/29/2016 05/31/16   Cheral MarkerKimberly R Booker, CNM  promethazine-codeine (PHENERGAN WITH CODEINE) 6.25-10 MG/5ML syrup Take 5 mLs by mouth every 4 (four) hours as needed for cough (and body aches). Patient not taking: Reported on 06/29/2016 03/09/16   Burgess AmorJulie Idol, PA-C    Family History Family History  Problem Relation Age of Onset  . Bipolar disorder Maternal Grandmother   . Heart disease Maternal Grandfather   . Thyroid disease Paternal Grandmother   . Arthritis Mother   . Fibromyalgia Mother   . Birth defects Father  Social History Social History  Substance Use Topics  . Smoking status: Current Every Day Smoker    Packs/day: 1.00    Years: 4.00    Types: Cigarettes  . Smokeless tobacco: Never Used     Comment: trying to quit-educated on smoking during pregnancy  . Alcohol use No     Comment: socially     Allergies   Patient has no known allergies.   Review of Systems Review of Systems   Physical Exam Updated Vital Signs BP 132/64 (BP Location: Left Arm)   Pulse 95   Temp 99.1 F (37.3 C) (Oral)   Resp 18   Ht 5\' 7"  (1.702  m)   Wt 250 lb (113.4 kg)   LMP 04/04/2016 (Exact Date)   SpO2 100%   BMI 39.16 kg/m   Physical Exam  Constitutional: She is oriented to person, place, and time. She appears well-developed and well-nourished.  HENT:  Head: Normocephalic and atraumatic.  Mild tenderness over the temporalis muscles and exertion of paracervical muscles.   Eyes: EOM are normal. Pupils are equal, round, and reactive to light.  Fundi are normal.   Neck: Normal range of motion. Neck supple. No JVD present.  Cardiovascular: Normal rate, regular rhythm and normal heart sounds.   No murmur heard. Pulmonary/Chest: Effort normal and breath sounds normal. She has no wheezes. She has no rales. She exhibits no tenderness.  Abdominal: Soft. Bowel sounds are normal. She exhibits no distension and no mass. There is no tenderness.  Musculoskeletal: Normal range of motion. She exhibits no edema.  Lymphadenopathy:    She has no cervical adenopathy.  Neurological: She is alert and oriented to person, place, and time. No cranial nerve deficit. She exhibits normal muscle tone. Coordination normal.  Skin: Skin is warm and dry. No rash noted.  Psychiatric: She has a normal mood and affect. Her behavior is normal. Judgment and thought content normal.  Nursing note and vitals reviewed.    ED Treatments / Results  DIAGNOSTIC STUDIES: Oxygen Saturation is 100% on RA, normal by my interpretation.    COORDINATION OF CARE: 12:49 AM Discussed treatment plan with pt at bedside and pt agreed to plan.   Procedures Procedures (including critical care time)  Medications Ordered in ED Medications  sodium chloride 0.9 % bolus 1,000 mL (1,000 mLs Intravenous New Bag/Given 07/29/16 0128)  metoCLOPramide (REGLAN) injection 10 mg (10 mg Intravenous Given 07/29/16 0127)  diphenhydrAMINE (BENADRYL) injection 25 mg (25 mg Intravenous Given 07/29/16 0127)     Initial Impression / Assessment and Plan / ED Course  I have reviewed the  triage vital signs and the nursing notes.  Pertinent labs & imaging results that were available during my care of the patient were reviewed by me and considered in my medical decision making (see chart for details).  Headache with physical findings suggestive of muscle contraction headache. No red flags to suggest serious causes of headache. Old records are reviewed confirming good prenatal care for first trimester pregnancy but no prior ED visits for headaches. She is given a headache cocktail of normal saline, metoclopramide, diphenhydramine with excellent relief of headache. She is discharged to resume routine prenatal care.  Final Clinical Impressions(s) / ED Diagnoses   Final diagnoses:  Acute nonintractable headache, unspecified headache type  First trimester pregnancy    New Prescriptions Current Discharge Medication List     I personally performed the services described in this documentation, which was scribed in my presence. The recorded information  has been reviewed and is accurate.       Dione Booze, MD 07/29/16 [phone]

## 2016-07-29 NOTE — ED Notes (Signed)
Pt given ginger ale to drink. 

## 2016-07-30 ENCOUNTER — Telehealth: Payer: Self-pay | Admitting: *Deleted

## 2016-07-30 NOTE — Telephone Encounter (Signed)
Left message letting pt know I will mail out information on the HPV virus. Pt to review and call back with further questions. JSY

## 2016-07-30 NOTE — Telephone Encounter (Signed)
I called pt to let her know of abnormal pap. Pt was advised she would have a colpo on 3/6 with Dr. Despina HiddenEure. I explained what a colpo was. Pt still tested + for CHL but was advised she was probably tested to soon for that. I advised I mailed out info on HVP and will also send out info on a colpo. Pt voiced understanding. JSY

## 2016-08-17 ENCOUNTER — Encounter: Payer: Medicaid Other | Admitting: Women's Health

## 2016-08-17 ENCOUNTER — Encounter: Payer: Self-pay | Admitting: Obstetrics & Gynecology

## 2016-08-17 ENCOUNTER — Encounter: Payer: Medicaid Other | Admitting: Obstetrics & Gynecology

## 2016-08-17 ENCOUNTER — Ambulatory Visit (INDEPENDENT_AMBULATORY_CARE_PROVIDER_SITE_OTHER): Payer: Medicaid Other | Admitting: Obstetrics & Gynecology

## 2016-08-17 VITALS — BP 132/60 | HR 98 | Wt 255.0 lb

## 2016-08-17 DIAGNOSIS — R87610 Atypical squamous cells of undetermined significance on cytologic smear of cervix (ASC-US): Secondary | ICD-10-CM | POA: Diagnosis not present

## 2016-08-17 DIAGNOSIS — A749 Chlamydial infection, unspecified: Secondary | ICD-10-CM

## 2016-08-17 DIAGNOSIS — R87810 Cervical high risk human papillomavirus (HPV) DNA test positive: Secondary | ICD-10-CM | POA: Diagnosis not present

## 2016-08-17 DIAGNOSIS — Z331 Pregnant state, incidental: Secondary | ICD-10-CM | POA: Diagnosis not present

## 2016-08-17 DIAGNOSIS — Z1389 Encounter for screening for other disorder: Secondary | ICD-10-CM

## 2016-08-17 DIAGNOSIS — Z3482 Encounter for supervision of other normal pregnancy, second trimester: Secondary | ICD-10-CM

## 2016-08-17 LAB — POCT URINALYSIS DIPSTICK
Glucose, UA: NEGATIVE
KETONES UA: NEGATIVE
Leukocytes, UA: NEGATIVE
Nitrite, UA: NEGATIVE
Protein, UA: NEGATIVE
RBC UA: NEGATIVE

## 2016-08-17 NOTE — Progress Notes (Signed)
Colposcopy Procedure Note:  Colposcopy Procedure Note  Indications: Pap smear 2 months ago showed: ASCUS with POSITIVE high risk HPV. The prior pap showed first Pap ever.  Prior cervical/vaginal disease: n/a. Prior cervical treatment: n/a.  Smoker:  Yes.   New sexual partner:  No.  : time frame:    History of abnormal Pap: no  Procedure Details  The risks and benefits of the procedure and Written informed consent obtained.  Speculum placed in vagina and excellent visualization of cervix achieved, cervix swabbed x 3 with acetic acid solution.  Findings: Cervix: mild acetowhite changes on anterior cervix lip; Marland Kitchen. Vaginal inspection: normal without visible lesions. Vulvar colposcopy: vulvar colposcopy not performed.  Specimens: none  Complications: none.  Plan: Repeat colposcopy at 6-8 weeks post partum          G2P1001 2143w1d Estimated Date of Delivery: 01/31/17  Blood pressure 132/60, pulse 98, weight 255 lb (115.7 kg), last menstrual period 04/04/2016.   BP weight and urine results all reviewed and noted.  Please refer to the obstetrical flow sheet for the fundal height and fetal heart rate documentation:  Patient reports good fetal movement, denies any bleeding and no rupture of membranes symptoms or regular contractions. Patient is without complaints. All questions were answered.  Orders Placed This Encounter  Procedures  . GC/Chlamydia Probe Amp  . Maternal Screen, Integrated #2  . POCT urinalysis dipstick    Plan:  Continued routine obstetrical care, FHR 147  No Follow-up on file.

## 2016-08-18 LAB — GC/CHLAMYDIA PROBE AMP
Chlamydia trachomatis, NAA: NEGATIVE
Neisseria gonorrhoeae by PCR: NEGATIVE

## 2016-08-20 LAB — MATERNAL SCREEN, INTEGRATED #2
AFP MARKER: 13.8 ng/mL
AFP MoM: 0.65
CROWN RUMP LENGTH: 58.8 mm
DIA MOM: 1.41
DIA VALUE: 185 pg/mL
ESTRIOL UNCONJUGATED: 0.26 ng/mL
GEST. AGE ON COLLECTION DATE: 12.4 wk
GESTATIONAL AGE: 16.4 wk
Maternal Age at EDD: 23.4 years
NUCHAL TRANSLUCENCY (NT): 1.8 mm
NUCHAL TRANSLUCENCY MOM: 1.19
Number of Fetuses: 1
PAPP-A MOM: 1.84
PAPP-A Value: 900.9 ng/mL
TEST RESULTS: NEGATIVE
WEIGHT: 251 [lb_av]
Weight: 251 [lb_av]
hCG MoM: 0.63
hCG Value: 13.3 IU/mL
uE3 MoM: 0.34

## 2016-09-13 ENCOUNTER — Other Ambulatory Visit: Payer: Self-pay | Admitting: Obstetrics & Gynecology

## 2016-09-13 DIAGNOSIS — Z363 Encounter for antenatal screening for malformations: Secondary | ICD-10-CM

## 2016-09-14 ENCOUNTER — Ambulatory Visit (INDEPENDENT_AMBULATORY_CARE_PROVIDER_SITE_OTHER): Payer: Medicaid Other

## 2016-09-14 ENCOUNTER — Encounter: Payer: Self-pay | Admitting: Women's Health

## 2016-09-14 ENCOUNTER — Ambulatory Visit (INDEPENDENT_AMBULATORY_CARE_PROVIDER_SITE_OTHER): Payer: Medicaid Other | Admitting: Women's Health

## 2016-09-14 VITALS — BP 122/70 | HR 88 | Wt 256.0 lb

## 2016-09-14 DIAGNOSIS — A749 Chlamydial infection, unspecified: Secondary | ICD-10-CM

## 2016-09-14 DIAGNOSIS — R030 Elevated blood-pressure reading, without diagnosis of hypertension: Secondary | ICD-10-CM

## 2016-09-14 DIAGNOSIS — Z363 Encounter for antenatal screening for malformations: Secondary | ICD-10-CM | POA: Diagnosis not present

## 2016-09-14 DIAGNOSIS — Z1389 Encounter for screening for other disorder: Secondary | ICD-10-CM

## 2016-09-14 DIAGNOSIS — Z3482 Encounter for supervision of other normal pregnancy, second trimester: Secondary | ICD-10-CM

## 2016-09-14 DIAGNOSIS — Z331 Pregnant state, incidental: Secondary | ICD-10-CM

## 2016-09-14 LAB — POCT URINALYSIS DIPSTICK
Glucose, UA: NEGATIVE
Ketones, UA: NEGATIVE
NITRITE UA: NEGATIVE
RBC UA: NEGATIVE

## 2016-09-14 NOTE — Progress Notes (Signed)
[place] 20+1 wks,cephalic,anterior placenta,cx 3.8 cm,svp of fluid 4.7 cm,normal ov's bilat,fhr 129 bpm,EFW 353 g,anatomy complete,no obvious abnormalities seen

## 2016-09-14 NOTE — Patient Instructions (Addendum)
Warm salt water gargles, chloraseptic spray, cough drops  Tips to Help You Sleep Better:   Get into a bedtime routine, try to do the same thing every night before going to bed to try to help your body wind down  Warm baths  Avoid caffeine for at least 3 hours before going to sleep   Keep your room at a slightly cooler temperature, can try running a fan  Turn off TV, lights, phone, electronics  Lots of pillows if needed to help you get comfortable  Lavender scented items can help you sleep. You can place lavender essential oil on a cotton ball and place under your pillowcase, or place in a diffuser. [employer] has a lavender scented sleep line (plug-ins, sprays, etc). Look in the pillow aisle for lavender scented pillows.   If none of the above things help, you can try 1/2 to 1 tablet of benadryl, unisom, or tylenol pm. Do not take this every night, only when you really need it.    Second Trimester of Pregnancy The second trimester is from week 14 through week 27 (months 4 through 6). The second trimester is often a time when you feel your best. Your body has adjusted to being pregnant, and you begin to feel better physically. Usually, morning sickness has lessened or quit completely, you may have more energy, and you may have an increase in appetite. The second trimester is also a time when the fetus is growing rapidly. At the end of the sixth month, the fetus is about 9 inches long and weighs about 1 pounds. You will likely begin to feel the baby move (quickening) between 16 and 20 weeks of pregnancy. Body changes during your second trimester Your body continues to go through many changes during your second trimester. The changes vary from woman to woman.  Your weight will continue to increase. You will notice your lower abdomen bulging out.  You may begin to get stretch marks on your hips, abdomen, and breasts.  You may develop headaches that can be relieved by medicines. The medicines  should be approved by your health care provider.  You may urinate more often because the fetus is pressing on your bladder.  You may develop or continue to have heartburn as a result of your pregnancy.  You may develop constipation because certain hormones are causing the muscles that push waste through your intestines to slow down.  You may develop hemorrhoids or swollen, bulging veins (varicose veins).  You may have back pain. This is caused by:  Weight gain.  Pregnancy hormones that are relaxing the joints in your pelvis.  A shift in weight and the muscles that support your balance.  Your breasts will continue to grow and they will continue to become tender.  Your gums may bleed and may be sensitive to brushing and flossing.  Dark spots or blotches (chloasma, mask of pregnancy) may develop on your face. This will likely fade after the baby is born.  A dark line from your belly button to the pubic area (linea nigra) may appear. This will likely fade after the baby is born.  You may have changes in your hair. These can include thickening of your hair, rapid growth, and changes in texture. Some women also have hair loss during or after pregnancy, or hair that feels dry or thin. Your hair will most likely return to normal after your baby is born. What to expect at prenatal visits During a routine prenatal visit:  You  will be weighed to make sure you and the fetus are growing normally.  Your blood pressure will be taken.  Your abdomen will be measured to track your baby's growth.  The fetal heartbeat will be listened to.  Any test results from the previous visit will be discussed. Your health care provider may ask you:  How you are feeling.  If you are feeling the baby move.  If you have had any abnormal symptoms, such as leaking fluid, bleeding, severe headaches, or abdominal cramping.  If you are using any tobacco products, including cigarettes, chewing tobacco, and  electronic cigarettes.  If you have any questions. Other tests that may be performed during your second trimester include:  Blood tests that check for:  Low iron levels (anemia).  High blood sugar that affects pregnant women (gestational diabetes) between 24 and 28 weeks.  Rh antibodies. This is to check for a protein on red blood cells (Rh factor).  Urine tests to check for infections, diabetes, or protein in the urine.  An ultrasound to confirm the proper growth and development of the baby.  An amniocentesis to check for possible genetic problems.  Fetal screens for spina bifida and Down syndrome.  HIV (human immunodeficiency virus) testing. Routine prenatal testing includes screening for HIV, unless you choose not to have this test. Follow these instructions at home: Medicines   Follow your health care provider's instructions regarding medicine use. Specific medicines may be either safe or unsafe to take during pregnancy.  Take a prenatal vitamin that contains at least 600 micrograms (mcg) of folic acid.  If you develop constipation, try taking a stool softener if your health care provider approves. Eating and drinking   Eat a balanced diet that includes fresh fruits and vegetables, whole grains, good sources of protein such as meat, eggs, or tofu, and low-fat dairy. Your health care provider will help you determine the amount of weight gain that is right for you.  Avoid raw meat and uncooked cheese. These carry germs that can cause birth defects in the baby.  If you have low calcium intake from food, talk to your health care provider about whether you should take a daily calcium supplement.  Limit foods that are high in fat and processed sugars, such as fried and sweet foods.  To prevent constipation:  Drink enough fluid to keep your urine clear or pale yellow.  Eat foods that are high in fiber, such as fresh fruits and vegetables, whole grains, and beans. Activity     Exercise only as directed by your health care provider. Most women can continue their usual exercise routine during pregnancy. Try to exercise for 30 minutes at least 5 days a week. Stop exercising if you experience uterine contractions.  Avoid heavy lifting, wear low heel shoes, and practice good posture.  A sexual relationship may be continued unless your health care provider directs you otherwise. Relieving pain and discomfort   Wear a good support bra to prevent discomfort from breast tenderness.  Take warm sitz baths to soothe any pain or discomfort caused by hemorrhoids. Use hemorrhoid cream if your health care provider approves.  Rest with your legs elevated if you have leg cramps or low back pain.  If you develop varicose veins, wear support hose. Elevate your feet for 15 minutes, 3-4 times a day. Limit salt in your diet. Prenatal Care   Write down your questions. Take them to your prenatal visits.  Keep all your prenatal visits as told  by your health care provider. This is important. Safety   Wear your seat belt at all times when driving.  Make a list of emergency phone numbers, including numbers for family, friends, the hospital, and police and fire departments. General instructions   Ask your health care provider for a referral to a local prenatal education class. Begin classes no later than the beginning of month 6 of your pregnancy.  Ask for help if you have counseling or nutritional needs during pregnancy. Your health care provider can offer advice or refer you to specialists for help with various needs.  Do not use hot tubs, steam rooms, or saunas.  Do not douche or use tampons or scented sanitary pads.  Do not cross your legs for long periods of time.  Avoid cat litter boxes and soil used by cats. These carry germs that can cause birth defects in the baby and possibly loss of the fetus by miscarriage or stillbirth.  Avoid all smoking, herbs, alcohol, and  unprescribed drugs. Chemicals in these products can affect the formation and growth of the baby.  Do not use any products that contain nicotine or tobacco, such as cigarettes and e-cigarettes. If you need help quitting, ask your health care provider.  Visit your dentist if you have not gone yet during your pregnancy. Use a soft toothbrush to brush your teeth and be gentle when you floss. Contact a health care provider if:  You have dizziness.  You have mild pelvic cramps, pelvic pressure, or nagging pain in the abdominal area.  You have persistent nausea, vomiting, or diarrhea.  You have a bad smelling vaginal discharge.  You have pain when you urinate. Get help right away if:  You have a fever.  You are leaking fluid from your vagina.  You have spotting or bleeding from your vagina.  You have severe abdominal cramping or pain.  You have rapid weight gain or weight loss.  You have shortness of breath with chest pain.  You notice sudden or extreme swelling of your face, hands, ankles, feet, or legs.  You have not felt your baby move in over an hour.  You have severe headaches that do not go away when you take medicine.  You have vision changes. Summary  The second trimester is from week 14 through week 27 (months 4 through 6). It is also a time when the fetus is growing rapidly.  Your body goes through many changes during pregnancy. The changes vary from woman to woman.  Avoid all smoking, herbs, alcohol, and unprescribed drugs. These chemicals affect the formation and growth your baby.  Do not use any tobacco products, such as cigarettes, chewing tobacco, and e-cigarettes. If you need help quitting, ask your health care provider.  Contact your health care provider if you have any questions. Keep all prenatal visits as told by your health care provider. This is important. This information is not intended to replace advice given to you by your health care provider. Make  sure you discuss any questions you have with your health care provider. Document Released: 05/25/2001 Document Revised: 11/06/2015 Document Reviewed: 08/01/2012 Elsevier Interactive Patient Education  2017 [employer].

## 2016-09-14 NOTE — Progress Notes (Signed)
Low-risk OB appointment G2P1001 [redacted]w[redacted]d Estimated Date of Delivery: 01/31/17 BP 122/70   Pulse 88   Wt 256 lb (116.1 kg)   LMP 04/04/2016 (Exact Date)   BMI 40.10 kg/m   BP, weight, and urine reviewed.  Refer to obstetrical flow sheet for FH & FHR.  Reports good fm.  Denies regular uc's, lof, vb, or uti s/s. Sore throat x few days, no other sx.  Throat w/o erythema/exudate, tonsils slightly enlarged- reviewed relief measures.  Feels sob at random times, denies any other sx. Does smoke, h/o asthma.  Pulse ox 99% RA, not tachycardic, HRRR, LCTAB- discussed physiological changes during pregnancy, reviewed warning s/s to report Trouble sleeping- gave printed relief measures Reviewed today's normal anatomy u/s, warning s/s, fm. Plan:  Continue routine obstetrical care  F/U in 4wks for OB appointment

## 2016-09-23 ENCOUNTER — Telehealth: Payer: Self-pay | Admitting: Obstetrics & Gynecology

## 2016-09-23 NOTE — Telephone Encounter (Signed)
Pt called concerned that on my chart she saw a note that said borderline hypertension and she also so where she had a trace of protein in her urine. I reviewed chart and informed pt that on 3/6 her systolic BP was slightly elevated so that is more than likely the reason that the comment about hypertension was charted. I informed her that her BP before that visit and the one after were normal. I informed her we would continue to monitor her BP and urine proteins with each visit and we would let her know if anything was abnormal. Pt reassured. Pt verbalized understanding.

## 2016-09-23 NOTE — Telephone Encounter (Signed)
Pt called stating that she would like to speak with a nurse regarding her blood work results. Please contact pt

## 2016-10-07 ENCOUNTER — Telehealth: Payer: Self-pay | Admitting: *Deleted

## 2016-10-07 NOTE — Telephone Encounter (Signed)
LMOVM following-up on after hours call to nurse for cramping. Informed to call back if she was still having cramps.

## 2016-10-13 ENCOUNTER — Ambulatory Visit (INDEPENDENT_AMBULATORY_CARE_PROVIDER_SITE_OTHER): Payer: Medicaid Other | Admitting: Women's Health

## 2016-10-13 ENCOUNTER — Encounter: Payer: Self-pay | Admitting: Women's Health

## 2016-10-13 VITALS — BP 128/70 | HR 88 | Wt 258.5 lb

## 2016-10-13 DIAGNOSIS — B9689 Other specified bacterial agents as the cause of diseases classified elsewhere: Secondary | ICD-10-CM

## 2016-10-13 DIAGNOSIS — O26812 Pregnancy related exhaustion and fatigue, second trimester: Secondary | ICD-10-CM

## 2016-10-13 DIAGNOSIS — Z8659 Personal history of other mental and behavioral disorders: Secondary | ICD-10-CM

## 2016-10-13 DIAGNOSIS — R102 Pelvic and perineal pain: Secondary | ICD-10-CM

## 2016-10-13 DIAGNOSIS — Z331 Pregnant state, incidental: Secondary | ICD-10-CM

## 2016-10-13 DIAGNOSIS — Z1389 Encounter for screening for other disorder: Secondary | ICD-10-CM

## 2016-10-13 DIAGNOSIS — Z3482 Encounter for supervision of other normal pregnancy, second trimester: Secondary | ICD-10-CM

## 2016-10-13 DIAGNOSIS — R82998 Other abnormal findings in urine: Secondary | ICD-10-CM

## 2016-10-13 DIAGNOSIS — R8299 Other abnormal findings in urine: Secondary | ICD-10-CM

## 2016-10-13 DIAGNOSIS — N760 Acute vaginitis: Secondary | ICD-10-CM | POA: Diagnosis not present

## 2016-10-13 DIAGNOSIS — O26892 Other specified pregnancy related conditions, second trimester: Secondary | ICD-10-CM

## 2016-10-13 DIAGNOSIS — O26899 Other specified pregnancy related conditions, unspecified trimester: Secondary | ICD-10-CM

## 2016-10-13 LAB — POCT WET PREP (WET MOUNT)
Clue Cells Wet Prep Whiff POC: POSITIVE
Trichomonas Wet Prep HPF POC: ABSENT

## 2016-10-13 LAB — POCT URINALYSIS DIPSTICK
Blood, UA: NEGATIVE
GLUCOSE UA: NEGATIVE
Nitrite, UA: NEGATIVE

## 2016-10-13 LAB — POCT HEMOGLOBIN: Hemoglobin: 12 g/dL — AB (ref 12.2–16.2)

## 2016-10-13 MED ORDER — ESCITALOPRAM OXALATE 10 MG PO TABS: 10.0000 mg | ORAL_TABLET | Freq: Every day | ORAL | 6 refills | Status: DC

## 2016-10-13 MED ORDER — METRONIDAZOLE 500 MG PO TABS: 500.0000 mg | ORAL_TABLET | Freq: Two times a day (BID) | ORAL | 0 refills | Status: DC

## 2016-10-13 NOTE — Patient Instructions (Addendum)
You will have your sugar test next visit.  Please do not eat or drink anything after midnight the night before you come, not even water.  You will be here for at least two hours.     Call the office [phone]) or go to [facility] if:  You begin to have strong, frequent contractions  Your water breaks.  Sometimes it is a big gush of fluid, sometimes it is just a trickle that keeps getting your panties wet or running down your legs  You have vaginal bleeding.  It is normal to have a small amount of spotting if your cervix was checked.   You don't feel your baby moving like normal.  If you don't, get you something to eat and drink and lay down and focus on feeling your baby move.   If your baby is still not moving like normal, you should call the office or go to [facility].  For Headaches:   Stay well hydrated, drink enough water so that your urine is clear, sometimes if you are dehydrated you can get headaches  Eat small frequent meals and snacks, sometimes if you are hungry you can get headaches  Sometimes you get headaches during pregnancy from the pregnancy hormones  You can try tylenol (1-2 regular strength  or 1-2 extra strength ) as directed on the box. The least amount of medication that works is best.   Cool compresses (cool wet washcloth or ice pack) to area of head that is hurting  You can also try drinking a caffeinated drink to see if this will help  If not helping, try below:  For Prevention of Headaches/Migraines:  CoQ10  three times daily  Vitamin B2  daily  Magnesium Oxide 400-600mg  daily  If You Get a Bad Headache/Migraine:  Benadryl    Magnesium Oxide  1 large Gatorade  2 extra strength Tylenol (1,000mg  total)  1 cup coffee or Coke  If this doesn't help please call us @ [phone]    Second Trimester of Pregnancy The second trimester is from week 13 through week 28, months 4 through 6. The second trimester is  often a time when you feel your best. Your body has also adjusted to being pregnant, and you begin to feel better physically. Usually, morning sickness has lessened or quit completely, you may have more energy, and you may have an increase in appetite. The second trimester is also a time when the fetus is growing rapidly. At the end of the sixth month, the fetus is about 9 inches long and weighs about 1 pounds. You will likely begin to feel the baby move (quickening) between 18 and 20 weeks of the pregnancy. BODY CHANGES Your body goes through many changes during pregnancy. The changes vary from woman to woman.   Your weight will continue to increase. You will notice your lower abdomen bulging out.  You may begin to get stretch marks on your hips, abdomen, and breasts.  You may develop headaches that can be relieved by medicines approved by your health care provider.  You may urinate more often because the fetus is pressing on your bladder.  You may develop or continue to have heartburn as a result of your pregnancy.  You may develop constipation because certain hormones are causing the muscles that push waste through your intestines to slow down.  You may develop hemorrhoids or swollen, bulging veins (varicose veins).  You may have back pain because of the weight gain and pregnancy  hormones relaxing your joints between the bones in your pelvis and as a result of a shift in weight and the muscles that support your balance.  Your breasts will continue to grow and be tender.  Your gums may bleed and may be sensitive to brushing and flossing.  Dark spots or blotches (chloasma, mask of pregnancy) may develop on your face. This will likely fade after the baby is born.  A dark line from your belly button to the pubic area (linea nigra) may appear. This will likely fade after the baby is born.  You may have changes in your hair. These can include thickening of your hair, rapid growth, and  changes in texture. Some women also have hair loss during or after pregnancy, or hair that feels dry or thin. Your hair will most likely return to normal after your baby is born. WHAT TO EXPECT AT YOUR PRENATAL VISITS During a routine prenatal visit:  You will be weighed to make sure you and the fetus are growing normally.  Your blood pressure will be taken.  Your abdomen will be measured to track your baby's growth.  The fetal heartbeat will be listened to.  Any test results from the previous visit will be discussed. Your health care provider may ask you:  How you are feeling.  If you are feeling the baby move.  If you have had any abnormal symptoms, such as leaking fluid, bleeding, severe headaches, or abdominal cramping.  If you have any questions. Other tests that may be performed during your second trimester include:  Blood tests that check for:  Low iron levels (anemia).  Gestational diabetes (between 24 and 28 weeks).  Rh antibodies.  Urine tests to check for infections, diabetes, or protein in the urine.  An ultrasound to confirm the proper growth and development of the baby.  An amniocentesis to check for possible genetic problems.  Fetal screens for spina bifida and Down syndrome. HOME CARE INSTRUCTIONS   Avoid all smoking, herbs, alcohol, and unprescribed drugs. These chemicals affect the formation and growth of the baby.  Follow your health care provider's instructions regarding medicine use. There are medicines that are either safe or unsafe to take during pregnancy.  Exercise only as directed by your health care provider. Experiencing uterine cramps is a good sign to stop exercising.  Continue to eat regular, healthy meals.  Wear a good support bra for breast tenderness.  Do not use hot tubs, steam rooms, or saunas.  Wear your seat belt at all times when driving.  Avoid raw meat, uncooked cheese, cat litter boxes, and soil used by cats. These carry  germs that can cause birth defects in the baby.  Take your prenatal vitamins.  Try taking a stool softener (if your health care provider approves) if you develop constipation. Eat more high-fiber foods, such as fresh vegetables or fruit and whole grains. Drink plenty of fluids to keep your urine clear or pale yellow.  Take warm sitz baths to soothe any pain or discomfort caused by hemorrhoids. Use hemorrhoid cream if your health care provider approves.  If you develop varicose veins, wear support hose. Elevate your feet for 15 minutes, 3-4 times a day. Limit salt in your diet.  Avoid heavy lifting, wear low heel shoes, and practice good posture.  Rest with your legs elevated if you have leg cramps or low back pain.  Visit your dentist if you have not gone yet during your pregnancy. Use a soft toothbrush to  brush your teeth and be gentle when you floss.  A sexual relationship may be continued unless your health care provider directs you otherwise.  Continue to go to all your prenatal visits as directed by your health care provider. SEEK MEDICAL CARE IF:   You have dizziness.  You have mild pelvic cramps, pelvic pressure, or nagging pain in the abdominal area.  You have persistent nausea, vomiting, or diarrhea.  You have a bad smelling vaginal discharge.  You have pain with urination. SEEK IMMEDIATE MEDICAL CARE IF:   You have a fever.  You are leaking fluid from your vagina.  You have spotting or bleeding from your vagina.  You have severe abdominal cramping or pain.  You have rapid weight gain or loss.  You have shortness of breath with chest pain.  You notice sudden or extreme swelling of your face, hands, ankles, feet, or legs.  You have not felt your baby move in over an hour.  You have severe headaches that do not go away with medicine.  You have vision changes. Document Released: 05/25/2001 Document Revised: 06/05/2013 Document Reviewed: 08/01/2012 [facility]  Patient Information 2015 [place], [place]. This information is not intended to replace advice given to you by your health care provider. Make sure you discuss any questions you have with your health care provider.

## 2016-10-13 NOTE — Addendum Note (Signed)
Addended by: Colen Darling on: 10/13/2016 11:54 AM   Modules accepted: Orders

## 2016-10-13 NOTE — Progress Notes (Signed)
Low-risk OB appointment G2P1001 [redacted]w[redacted]d Estimated Date of Delivery: 01/31/17 BP 128/70   Pulse (!) 130   Wt 258 lb 8 oz (117.3 kg)   LMP 04/04/2016 (Exact Date)   BMI 40.49 kg/m   BP, weight, and urine reviewed.  Refer to obstetrical flow sheet for FH & FHR.  Reports good fm.  Denies regular uc's, lof, vb, or uti s/s.  Constant pelvic pressure. Denies abnormal d/c, itching/odor/irritation.  Bilateral legs achy and restless all the time w/ occ numbness in fingers and toes.  Headaches- still hasn't tried all on list I have given her previously- to try everything (specifically add magnesium supplement) if not helping- let us know.  Tired all the time.  Angry, gets very irritated w/ people and feels like punching them. Has h/o depression/anxiety- was on lexapro in past, feels she needs to resume this. Denies SI/HI. Does not want to to do counseling at this time. Rx Lexapro  daily- understands can take a few weeks to notice improvement- let us know if decides for counseling Fingerstick Hgb 12 Spec exam: cx visually long & closed, SVE deferred Mod amt thin white malodorous d/c, wet prep- many clues, rx metronidazole bid x 7d for BV, no sex while taking Send gc/ct Send urine cx d/t leuks, 1+ protein Reviewed ptl s/s, fkc. Plan:  Continue routine obstetrical care  F/U in 4wks for OB appointment and pn2

## 2016-10-15 LAB — URINE CULTURE

## 2016-10-15 LAB — GC/CHLAMYDIA PROBE AMP
Chlamydia trachomatis, NAA: NEGATIVE
Neisseria gonorrhoeae by PCR: NEGATIVE

## 2016-10-20 ENCOUNTER — Encounter: Payer: Self-pay | Admitting: Women's Health

## 2016-10-22 ENCOUNTER — Telehealth: Payer: Self-pay | Admitting: Obstetrics & Gynecology

## 2016-10-22 ENCOUNTER — Encounter (HOSPITAL_COMMUNITY): Payer: Self-pay | Admitting: Emergency Medicine

## 2016-10-22 ENCOUNTER — Emergency Department (HOSPITAL_COMMUNITY)
Admission: EM | Admit: 2016-10-22 | Discharge: 2016-10-22 | Disposition: A | Discharge status: Home / Self Care | Payer: Medicaid Other | Attending: Emergency Medicine | Admitting: Emergency Medicine

## 2016-10-22 ENCOUNTER — Telehealth: Payer: Self-pay | Admitting: *Deleted

## 2016-10-22 ENCOUNTER — Encounter: Payer: Self-pay | Admitting: Women's Health

## 2016-10-22 DIAGNOSIS — K529 Noninfective gastroenteritis and colitis, unspecified: Secondary | ICD-10-CM

## 2016-10-22 DIAGNOSIS — O212 Late vomiting of pregnancy: Secondary | ICD-10-CM | POA: Diagnosis present

## 2016-10-22 DIAGNOSIS — F17210 Nicotine dependence, cigarettes, uncomplicated: Secondary | ICD-10-CM | POA: Insufficient documentation

## 2016-10-22 DIAGNOSIS — O99612 Diseases of the digestive system complicating pregnancy, second trimester: Secondary | ICD-10-CM | POA: Diagnosis not present

## 2016-10-22 DIAGNOSIS — Z79899 Other long term (current) drug therapy: Secondary | ICD-10-CM | POA: Diagnosis not present

## 2016-10-22 DIAGNOSIS — A749 Chlamydial infection, unspecified: Secondary | ICD-10-CM

## 2016-10-22 DIAGNOSIS — R030 Elevated blood-pressure reading, without diagnosis of hypertension: Secondary | ICD-10-CM

## 2016-10-22 DIAGNOSIS — O99332 Smoking (tobacco) complicating pregnancy, second trimester: Secondary | ICD-10-CM | POA: Insufficient documentation

## 2016-10-22 DIAGNOSIS — Z3A25 25 weeks gestation of pregnancy: Secondary | ICD-10-CM | POA: Diagnosis not present

## 2016-10-22 LAB — COMPREHENSIVE METABOLIC PANEL
ALBUMIN: 3.3 g/dL — AB (ref 3.5–5.0)
ALK PHOS: 75 U/L (ref 38–126)
ALT: 12 U/L — AB (ref 14–54)
ANION GAP: 11 (ref 5–15)
AST: 16 U/L (ref 15–41)
BUN: 8 mg/dL (ref 6–20)
CALCIUM: 9.1 mg/dL (ref 8.9–10.3)
CO2: 20 mmol/L — AB (ref 22–32)
CREATININE: 0.5 mg/dL (ref 0.44–1.00)
Chloride: 105 mmol/L (ref 101–111)
GFR calc Af Amer: >60 mL/min (ref >60)
GFR calc non Af Amer: >60 mL/min (ref >60)
GLUCOSE: 121 mg/dL — AB (ref 65–99)
Potassium: 3.6 mmol/L (ref 3.5–5.1)
SODIUM: 136 mmol/L (ref 135–145)
Total Bilirubin: 0.4 mg/dL (ref 0.3–1.2)
Total Protein: 7.1 g/dL (ref 6.5–8.1)

## 2016-10-22 LAB — URINALYSIS, ROUTINE W REFLEX MICROSCOPIC
Bacteria, UA: NONE SEEN
Glucose, UA: NEGATIVE mg/dL
Hgb urine dipstick: NEGATIVE
Ketones, ur: 80 mg/dL — AB
LEUKOCYTES UA: NEGATIVE
Nitrite: NEGATIVE
Protein, ur: 30 mg/dL — AB
SPECIFIC GRAVITY, URINE: 1.033 — AB (ref 1.005–1.030)
pH: 5 (ref 5.0–8.0)

## 2016-10-22 LAB — CBC WITH DIFFERENTIAL/PLATELET
BASOS ABS: 0 10*3/uL (ref 0.0–0.1)
Basophils Relative: 0 %
Eosinophils Absolute: 0 10*3/uL (ref 0.0–0.7)
Eosinophils Relative: 0 %
HCT: 36.2 % (ref 36.0–46.0)
HEMOGLOBIN: 12.9 g/dL (ref 12.0–15.0)
LYMPHS ABS: 0.5 10*3/uL — AB (ref 0.7–4.0)
LYMPHS PCT: 4 %
MCH: 31.4 pg (ref 26.0–34.0)
MCHC: 35.6 g/dL (ref 30.0–36.0)
MCV: 88.1 fL (ref 78.0–100.0)
MONO ABS: 0.6 10*3/uL (ref 0.1–1.0)
Monocytes Relative: 4 %
NEUTROS ABS: 12.5 10*3/uL — AB (ref 1.7–7.7)
Neutrophils Relative %: 92 %
Platelets: 177 10*3/uL (ref 150–400)
RBC: 4.11 MIL/uL (ref 3.87–5.11)
RDW: 13.4 % (ref 11.5–15.5)
WBC: 13.6 10*3/uL — ABNORMAL HIGH (ref 4.0–10.5)

## 2016-10-22 MED ORDER — ACETAMINOPHEN 325 MG PO TABS
650.0000 mg | ORAL_TABLET | Freq: Once | ORAL | Status: AC
  Administered 2016-10-22: 650 mg via ORAL
  Filled 2016-10-22: qty 2

## 2016-10-22 MED ORDER — SODIUM CHLORIDE 0.9 % IV SOLN
Freq: Once | INTRAVENOUS | Status: AC
  Administered 2016-10-22: 20:00:00 via INTRAVENOUS

## 2016-10-22 MED ORDER — SODIUM CHLORIDE 0.9 % IV BOLUS (SEPSIS)
2000.0000 mL | Freq: Once | INTRAVENOUS | Status: AC
  Administered 2016-10-22: 2000 mL via INTRAVENOUS

## 2016-10-22 MED ORDER — PROMETHAZINE HCL 25 MG PO TABS: 12.5000 mg | ORAL_TABLET | Freq: Four times a day (QID) | ORAL | 0 refills | Status: DC | PRN

## 2016-10-22 MED ORDER — ONDANSETRON HCL 4 MG/2ML IJ SOLN
4.0000 mg | Freq: Once | INTRAMUSCULAR | Status: AC
  Administered 2016-10-22: 4 mg via INTRAVENOUS
  Filled 2016-10-22: qty 2

## 2016-10-22 MED ORDER — SODIUM CHLORIDE 0.9 % IV BOLUS (SEPSIS)
1000.0000 mL | Freq: Once | INTRAVENOUS | Status: DC
Start: 2016-10-22 — End: 2016-10-22

## 2016-10-22 NOTE — ED Notes (Signed)
Pt is almost [redacted] weeks pregnant. Error in earlier documentation where it was mentioned pt was [redacted] weeks pregnant

## 2016-10-22 NOTE — ED Notes (Addendum)
Pt remains on monitor. 

## 2016-10-22 NOTE — ED Notes (Addendum)
Fetal heart tone 176

## 2016-10-22 NOTE — Progress Notes (Addendum)
1800 Received call about this 21yo G2P1 @25 .[redacted]wks GA in with report of nausea, vomiting, and diarrhea.  Maternal tachycardia in the 150's.  Denies vaginal bleeding or LOF from vagina.  Reports some pain with fetal movement/ cramping.  1849  FHR Category I, no UC's noted.  Dr. Shawnie PonsPratt notified of above.  She states that patient can be taken off EFM and recommends SSE or SVE by EDP to rule out cervical dilation.  1825  Above recommendations given to ED RN. Merlinda [id number] [facility] RN requested ED RN adjust EFM to trace fetal heart rate prior to discontinuing EFM.  Currently tracing maternal heart rate.

## 2016-10-22 NOTE — Discharge Instructions (Signed)
Usual Phenergan for nausea drink plenty of fluids follow-up with your doctor next week. If he gets worse [facility]. Take Tylenol for pain or fever

## 2016-10-22 NOTE — ED Notes (Signed)
ED Provider at bedside. 

## 2016-10-22 NOTE — ED Notes (Signed)
[facility] said okay to take pt off of monitor and recommends cervical exam by EDP. Dr. Estell HarpinZammit notified of the same. Prior to d/c monitor, FHR 157-162 noted on the monitor. Pt denies pain, hydration in progress.

## 2016-10-22 NOTE — Telephone Encounter (Signed)
Informed patient [facility] Advised it will make her drowsy so to take when she is able to rest. Also encourage to take phenergan and push fluids when able to avoid dehydration. If she starts to notice decreased urine output, [facility]. Verbalized understanding.

## 2016-10-22 NOTE — ED Notes (Signed)
Monitor applied and OB rapid response notified.

## 2016-10-22 NOTE — Telephone Encounter (Signed)
Pt  Called and left a message stating that she has been using the bathroom uncontrollably and vomiting. Pt states she is [redacted] weeks pregnant. Please contact pt

## 2016-10-22 NOTE — ED Triage Notes (Signed)
Pt reports 5-[redacted] weeks pregnant and cannot keep anything down  Danielle RifeK. Booker PCP

## 2016-10-22 NOTE — ED Notes (Signed)
Pt reprts that she has not voided since last pm

## 2016-10-22 NOTE — ED Provider Notes (Signed)
[facility]PT Provider Note   CSN: [id number] Arrival date & time: 10/22/16  1729     History   Chief Complaint Chief Complaint  Patient presents with  . Emesis During Pregnancy    HPI [name] is a 21 y.o. female.  Patient complains of vomiting over 20 times today and having diarrhea over 10 times. No blood in her vomit or diarrhea. Patient is 25-1/[redacted] weeks pregnant. she's having mild cramping in her abdomen   The history is provided by the patient. No language interpreter was used.  Emesis   This is a new problem. The current episode started 12 to 24 hours ago. The problem occurs more than 10 times per day. The emesis has an appearance of stomach contents. There has been no fever. Associated symptoms include chills and diarrhea. Pertinent negatives include no abdominal pain, no cough and no headaches.    Past Medical History:  Diagnosis Date  . Anxiety   . Depression    post partum  . Irregular menstrual bleeding 01/31/2014    Patient Active Problem List   Diagnosis Date Noted  . BV (bacterial vaginosis) 10/13/2016  . Abnormal Pap smear of cervix 07/26/2016  . Vaginal bleeding in pregnancy, first trimester 07/20/2016  . Rh negative state in antepartum period 07/20/2016  . Susceptible to varicella (non-immune), currently pregnant 07/20/2016  . Chlamydia 07/01/2016  . Borderline high blood pressure 07/01/2016  . Encounter for supervision of other normal pregnancy 05/31/2016  . Irregular menstrual bleeding 01/31/2014  . History of depression 05/07/2013  . Smoker 05/07/2013    Past Surgical History:  Procedure Laterality Date  . CHOLECYSTECTOMY    . CYST REMOVAL NECK    . TYMPANOSTOMY TUBE PLACEMENT    . TYMPANOSTOMY TUBE PLACEMENT      OB History    Gravida Para Term Preterm AB Living   2 1 1     1    SAB TAB Ectopic Multiple Live Births           1       Home Medications    Prior to Admission medications   Medication Sig Start Date End  Date Taking? Authorizing Provider  escitalopram (LEXAPRO) 10 MG tablet Take 1 tablet (10 mg total) by mouth daily. 10/13/16  Yes Cheral Marker, CNM  loratadine (CLARITIN) 10 MG tablet Take 10 mg by mouth daily.   Yes [provider]  metroNIDAZOLE (FLAGYL) 500 MG tablet Take 1 tablet (500 mg total) by mouth 2 (two) times daily. X 7 days. No sex or alcohol while taking Patient not taking: Reported on 10/22/2016 10/13/16   Cheral Marker, CNM  promethazine (PHENERGAN) 25 MG tablet Take 0.5-1 tablets (12.5-25 mg total) by mouth every 6 (six) hours as needed for nausea or vomiting. Patient not taking: Reported on 10/22/2016 10/22/16   Cheral Marker, CNM    Family History Family History  Problem Relation Age of Onset  . Bipolar disorder Maternal Grandmother   . Heart disease Maternal Grandfather   . Thyroid disease Paternal Grandmother   . Arthritis Mother   . Fibromyalgia Mother   . Diabetes Mother   . Birth defects Father     Social History Social History  Substance Use Topics  . Smoking status: Current Every Day Smoker    Packs/day: 0.50    Years: 4.00    Types: Cigarettes  . Smokeless tobacco: Never Used     Comment: trying to quit-educated on smoking during pregnancy  .  Alcohol use No     Comment: socially     Allergies   Patient has no known allergies.   Review of Systems Review of Systems  Constitutional: Positive for chills. Negative for appetite change and fatigue.  HENT: Negative for congestion, ear discharge and sinus pressure.   Eyes: Negative for discharge.  Respiratory: Negative for cough.   Cardiovascular: Negative for chest pain.  Gastrointestinal: Positive for diarrhea and vomiting. Negative for abdominal pain.  Genitourinary: Negative for frequency and hematuria.  Musculoskeletal: Negative for back pain.  Skin: Negative for rash.  Neurological: Negative for seizures and headaches.  Psychiatric/Behavioral: Negative for hallucinations.      Physical Exam Updated Vital Signs BP (!) 116/46   Pulse (!) 117   Temp (S) (!) 101.5 F (38.6 C) (Rectal)   Resp (!) 25   Ht 5\' 7"  (1.702 m)   Wt 258 lb (117 kg)   LMP 04/04/2016 (Exact Date)   SpO2 100%   BMI 40.41 kg/m   Physical Exam  Constitutional: She is oriented to person, place, and time. She appears well-developed.  HENT:  Head: Normocephalic.  Eyes: Conjunctivae and EOM are normal. No scleral icterus.  Neck: Neck supple. No thyromegaly present.  Cardiovascular: Normal rate and regular rhythm.  Exam reveals no gallop and no friction rub.   No murmur heard. Pulmonary/Chest: No stridor. She has no wheezes. She has no rales. She exhibits no tenderness.  Abdominal: She exhibits no distension. There is tenderness. There is no rebound.  Minimal. Umbilical tenderness  Genitourinary:  Genitourinary Comments: Vaginal exam exam showed no tenderness no discharge.  Os closed  Musculoskeletal: Normal range of motion. She exhibits no edema.  Lymphadenopathy:    She has no cervical adenopathy.  Neurological: She is oriented to person, place, and time. She exhibits normal muscle tone. Coordination normal.  Skin: No rash noted. No erythema.  Psychiatric: She has a normal mood and affect. Her behavior is normal.     ED Treatments / Results  Labs (all labs ordered are listed, but only abnormal results are displayed) Labs Reviewed  CBC WITH DIFFERENTIAL/PLATELET - Abnormal; Notable for the following:       Result Value   WBC 13.6 (*)    Neutro Abs 12.5 (*)    Lymphs Abs 0.5 (*)    All other components within normal limits  COMPREHENSIVE METABOLIC PANEL - Abnormal; Notable for the following:    CO2 20 (*)    Glucose, Bld 121 (*)    Albumin 3.3 (*)    ALT 12 (*)    All other components within normal limits  URINALYSIS, ROUTINE W REFLEX MICROSCOPIC - Abnormal; Notable for the following:    Color, Urine AMBER (*)    APPearance HAZY (*)    Specific Gravity, Urine 1.033  (*)    Bilirubin Urine SMALL (*)    Ketones, ur 80 (*)    Protein, ur 30 (*)    Squamous Epithelial / LPF 0-5 (*)    All other components within normal limits    EKG  EKG Interpretation None       Radiology No results found.  Procedures Procedures (including critical care time)  Medications Ordered in ED Medications  sodium chloride 0.9 % bolus 2,000 mL (0 mLs Intravenous Stopped 10/22/16 1949)  ondansetron (ZOFRAN) injection 4 mg (4 mg Intravenous Given 10/22/16 1816)  0.9 %  sodium chloride infusion ( Intravenous Stopped 10/22/16 2049)  acetaminophen (TYLENOL) tablet 650 mg (650 mg Oral  Given 10/22/16 2053)     Initial Impression / Assessment and Plan / ED Course  I have reviewed the triage vital signs and the nursing notes.  Pertinent labs & imaging results that were available during my care of the patient were reviewed by me and considered in my medical decision making (see chart for details).     Patient was placed on the fetal heart tone monitor. Fetal heart tones are 160 she was not having contractions.  Final Clinical Impressions(s) / ED Diagnoses   Final diagnoses:  Gastroenteritis  Patient improved with fluids. Although she did develop a fever. I spoke with the GYN doctor who agreed with discharge home with Phenergan for nausea and follow-up with her OB/GYN. Most likely diagnosis gastroenteritis  New Prescriptions New Prescriptions   No medications on file     Bethann Berkshire, MD 10/22/16 2105

## 2016-10-22 NOTE — Telephone Encounter (Signed)
Patient called with complaints of vomiting and diarrhea since last night. She has samples of Diclegis but it is not helping. Encouraged small sips of fluids and good hand hygiene. Advised she could take Imodium for the diarrhea if needed. Please advise.

## 2016-10-29 ENCOUNTER — Other Ambulatory Visit: Payer: Medicaid Other

## 2016-10-29 ENCOUNTER — Ambulatory Visit (INDEPENDENT_AMBULATORY_CARE_PROVIDER_SITE_OTHER): Payer: Medicaid Other | Admitting: Obstetrics & Gynecology

## 2016-10-29 ENCOUNTER — Encounter: Payer: Self-pay | Admitting: Obstetrics & Gynecology

## 2016-10-29 VITALS — BP 120/80 | HR 78 | Wt 256.2 lb

## 2016-10-29 DIAGNOSIS — Z1389 Encounter for screening for other disorder: Secondary | ICD-10-CM

## 2016-10-29 DIAGNOSIS — Z3482 Encounter for supervision of other normal pregnancy, second trimester: Secondary | ICD-10-CM

## 2016-10-29 DIAGNOSIS — Z131 Encounter for screening for diabetes mellitus: Secondary | ICD-10-CM

## 2016-10-29 DIAGNOSIS — Z331 Pregnant state, incidental: Secondary | ICD-10-CM

## 2016-10-29 DIAGNOSIS — Z3A26 26 weeks gestation of pregnancy: Secondary | ICD-10-CM

## 2016-10-29 LAB — POCT URINALYSIS DIPSTICK
Blood, UA: NEGATIVE
Glucose, UA: NEGATIVE
Ketones, UA: NEGATIVE
Leukocytes, UA: NEGATIVE
NITRITE UA: NEGATIVE

## 2016-10-29 NOTE — Progress Notes (Signed)
G2P1001 3867w4d Estimated Date of Delivery: 01/31/17  Blood pressure 120/80, pulse 78, weight 256 lb 3.2 oz (116.2 kg), last menstrual period 04/04/2016.   BP weight and urine results all reviewed and noted.  Please refer to the obstetrical flow sheet for the fundal height and fetal heart rate documentation:  Patient reports good fetal movement, denies any bleeding and no rupture of membranes symptoms or regular contractions. Patient is without complaints. All questions were answered.  Orders Placed This Encounter  Procedures  . POCT urinalysis dipstick    Plan:  Continued routine obstetrical care,  Complaining of vagina discomfort speculucm exam is normal no discharge no odor no lesions are noted, normal exam  Return in about 3 weeks (around 11/19/2016).

## 2016-10-30 LAB — CBC
Hematocrit: 33.7 % — ABNORMAL LOW (ref 34.0–46.6)
Hemoglobin: 11.5 g/dL (ref 11.1–15.9)
MCH: 30.5 pg (ref 26.6–33.0)
MCHC: 34.1 g/dL (ref 31.5–35.7)
MCV: 89 fL (ref 79–97)
PLATELETS: 197 10*3/uL (ref 150–379)
RBC: 3.77 x10E6/uL (ref 3.77–5.28)
RDW: 14 % (ref 12.3–15.4)
WBC: 12.6 10*3/uL — AB (ref 3.4–10.8)

## 2016-10-30 LAB — GLUCOSE TOLERANCE, 2 HOURS W/ 1HR
GLUCOSE, 1 HOUR: 153 mg/dL (ref 65–179)
GLUCOSE, 2 HOUR: 126 mg/dL (ref 65–152)
Glucose, Fasting: 87 mg/dL (ref 65–91)

## 2016-10-30 LAB — RPR: RPR Ser Ql: NONREACTIVE

## 2016-10-30 LAB — ANTIBODY SCREEN: ANTIBODY SCREEN: NEGATIVE

## 2016-10-30 LAB — HIV ANTIBODY (ROUTINE TESTING W REFLEX): HIV SCREEN 4TH GENERATION: NONREACTIVE

## 2016-11-01 ENCOUNTER — Encounter: Payer: Self-pay | Admitting: Women's Health

## 2016-11-10 ENCOUNTER — Other Ambulatory Visit: Payer: Medicaid Other

## 2016-11-10 ENCOUNTER — Encounter: Payer: Medicaid Other | Admitting: Advanced Practice Midwife

## 2016-11-17 ENCOUNTER — Ambulatory Visit (INDEPENDENT_AMBULATORY_CARE_PROVIDER_SITE_OTHER): Payer: Medicaid Other | Admitting: Advanced Practice Midwife

## 2016-11-17 ENCOUNTER — Encounter: Payer: Self-pay | Admitting: Advanced Practice Midwife

## 2016-11-17 VITALS — BP 128/80 | HR 105 | Wt 260.0 lb

## 2016-11-17 DIAGNOSIS — Z331 Pregnant state, incidental: Secondary | ICD-10-CM

## 2016-11-17 DIAGNOSIS — Z1389 Encounter for screening for other disorder: Secondary | ICD-10-CM

## 2016-11-17 DIAGNOSIS — Z3483 Encounter for supervision of other normal pregnancy, third trimester: Secondary | ICD-10-CM

## 2016-11-17 LAB — POCT URINALYSIS DIPSTICK
Glucose, UA: NEGATIVE
Ketones, UA: NEGATIVE
Nitrite, UA: NEGATIVE
RBC UA: NEGATIVE

## 2016-11-17 MED ORDER — OMEPRAZOLE 20 MG PO CPDR
20.0000 mg | DELAYED_RELEASE_CAPSULE | Freq: Every day | ORAL | 6 refills | Status: DC
Start: 2016-11-17 — End: 2017-01-31

## 2016-11-17 NOTE — Progress Notes (Signed)
G2P1001 3769w2d Estimated Date of Delivery: 01/31/17  Blood pressure 128/80, pulse (!) 105, weight 260 lb (117.9 kg), last menstrual period 04/04/2016.   BP weight and urine results all reviewed and noted.  Please refer to the obstetrical flow sheet for the fundal height and fetal heart rate documentation:  Patient reports good fetal movement, denies any bleeding and no rupture of membranes symptoms or regular contractions. Patient is without complaints.other than heartburn,  Rx prilosec. Has several areas (breast, arm) that look like strech marks, but the skin is indented.  Also has a lot vaginal skin tags since being pregnant.  Doesn't like them. Can remove them after delivery  All questions were answered.  Orders Placed This Encounter  Procedures  . POCT urinalysis dipstick    Plan:  Continued routine obstetrical care,   Return in about 3 weeks (around 12/08/2016) for LROB.

## 2016-11-17 NOTE — Patient Instructions (Signed)

## 2016-11-19 ENCOUNTER — Encounter: Payer: Medicaid Other | Admitting: Obstetrics and Gynecology

## 2016-11-28 ENCOUNTER — Encounter (HOSPITAL_COMMUNITY): Payer: Self-pay

## 2016-11-28 ENCOUNTER — Inpatient Hospital Stay (HOSPITAL_COMMUNITY)
Admission: AD | Admit: 2016-11-28 | Discharge: 2016-11-28 | Disposition: A | Discharge status: Home / Self Care | Payer: Medicaid Other | Source: Ambulatory Visit | Attending: Obstetrics & Gynecology | Admitting: Obstetrics & Gynecology

## 2016-11-28 DIAGNOSIS — N898 Other specified noninflammatory disorders of vagina: Secondary | ICD-10-CM | POA: Diagnosis not present

## 2016-11-28 DIAGNOSIS — R109 Unspecified abdominal pain: Secondary | ICD-10-CM | POA: Diagnosis present

## 2016-11-28 DIAGNOSIS — Z79899 Other long term (current) drug therapy: Secondary | ICD-10-CM | POA: Insufficient documentation

## 2016-11-28 DIAGNOSIS — F419 Anxiety disorder, unspecified: Secondary | ICD-10-CM | POA: Insufficient documentation

## 2016-11-28 DIAGNOSIS — F329 Major depressive disorder, single episode, unspecified: Secondary | ICD-10-CM | POA: Insufficient documentation

## 2016-11-28 DIAGNOSIS — O26893 Other specified pregnancy related conditions, third trimester: Secondary | ICD-10-CM | POA: Insufficient documentation

## 2016-11-28 DIAGNOSIS — N949 Unspecified condition associated with female genital organs and menstrual cycle: Secondary | ICD-10-CM

## 2016-11-28 DIAGNOSIS — Z3A30 30 weeks gestation of pregnancy: Secondary | ICD-10-CM | POA: Insufficient documentation

## 2016-11-28 DIAGNOSIS — O99333 Smoking (tobacco) complicating pregnancy, third trimester: Secondary | ICD-10-CM | POA: Diagnosis not present

## 2016-11-28 DIAGNOSIS — O99343 Other mental disorders complicating pregnancy, third trimester: Secondary | ICD-10-CM | POA: Insufficient documentation

## 2016-11-28 DIAGNOSIS — F17210 Nicotine dependence, cigarettes, uncomplicated: Secondary | ICD-10-CM | POA: Diagnosis not present

## 2016-11-28 LAB — URINALYSIS, ROUTINE W REFLEX MICROSCOPIC
Bilirubin Urine: NEGATIVE
Glucose, UA: 50 mg/dL — AB
Hgb urine dipstick: NEGATIVE
Ketones, ur: NEGATIVE mg/dL
Nitrite: NEGATIVE
Protein, ur: 30 mg/dL — AB
Specific Gravity, Urine: 1.028 (ref 1.005–1.030)
pH: 6 (ref 5.0–8.0)

## 2016-11-28 NOTE — MAU Note (Signed)
Yesterday at work started having pressure and vaginal pressure, lost a small amount of mucus plug yesterday and more today, no headache, feels foggy headed, decreased fetal movement, felt only once today.

## 2016-11-28 NOTE — MAU Provider Note (Signed)
History     CSN: [id number]  Arrival date and time: 11/28/16 1438   First Provider Initiated Contact with Patient 11/28/16 1524      Chief Complaint  Patient presents with  . Abdominal Pain   HPI   [name] is a 21 y.o. female G2P1001 @ 5638w6d here in MAU with complaints of abdominal pressure and concerns about her mucus plug. Says she lost part of the mucus plug today and yesterday. She denies vaginal bleeding or leaking of water. She [place] 4[place] and they told her that her fluid looked "low". She denies leaking.   No history of preterm birth.   OB History    Gravida Para Term Preterm AB Living   2 1 1     1    SAB TAB Ectopic Multiple Live Births           1      Past Medical History:  Diagnosis Date  . Anxiety   . Depression    post partum  . Irregular menstrual bleeding 01/31/2014    Past Surgical History:  Procedure Laterality Date  . CHOLECYSTECTOMY    . CYST REMOVAL NECK    . TYMPANOSTOMY TUBE PLACEMENT    . TYMPANOSTOMY TUBE PLACEMENT      Family History  Problem Relation Age of Onset  . Bipolar disorder Maternal Grandmother   . Heart disease Maternal Grandfather   . Thyroid disease Paternal Grandmother   . Arthritis Mother   . Fibromyalgia Mother   . Diabetes Mother   . Birth defects Father     Social History  Substance Use Topics  . Smoking status: Current Every Day Smoker    Packs/day: 0.50    Years: 4.00    Types: Cigarettes  . Smokeless tobacco: Never Used     Comment: trying to quit-educated on smoking during pregnancy  . Alcohol use No     Comment: socially    Allergies: No Known Allergies  Prescriptions Prior to Admission  Medication Sig Dispense Refill Last Dose  . escitalopram (LEXAPRO) 10 MG tablet Take 1 tablet (10 mg total) by mouth daily. 30 tablet 6 Taking  . loratadine (CLARITIN) 10 MG tablet Take 10 mg by mouth daily.   Not Taking  . omeprazole (PRILOSEC) 20 MG capsule Take 1 capsule  (20 mg total) by mouth daily. 30 capsule 6   . promethazine (PHENERGAN) 25 MG tablet Take 0.5-1 tablets (12.5-25 mg total) by mouth every 6 (six) hours as needed for nausea or vomiting. 30 tablet 0 Taking   Results for orders placed or performed during the hospital encounter of 11/28/16 (from the past 48 hour(s))  Urinalysis, Routine w reflex microscopic     Status: Abnormal   Collection Time: 11/28/16  2:54 PM  Result Value Ref Range   Color, Urine AMBER (A) YELLOW    Comment: BIOCHEMICALS MAY BE AFFECTED BY COLOR   APPearance HAZY (A) CLEAR   Specific Gravity, Urine 1.028 1.005 - 1.030   pH 6.0 5.0 - 8.0   Glucose, UA 50 (A) NEGATIVE mg/dL   Hgb urine dipstick NEGATIVE NEGATIVE   Bilirubin Urine NEGATIVE NEGATIVE   Ketones, ur NEGATIVE NEGATIVE mg/dL   Protein, ur 30 (A) NEGATIVE mg/dL   Nitrite NEGATIVE NEGATIVE   Leukocytes, UA MODERATE (A) NEGATIVE   RBC / HPF 0-5 0 - 5 RBC/hpf   WBC, UA 6-30 0 - 5 WBC/hpf   Bacteria, UA RARE (  A) NONE SEEN   Squamous Epithelial / LPF 6-30 (A) NONE SEEN   Mucous PRESENT    Ca Oxalate Crys, UA PRESENT     Review of Systems  Gastrointestinal: Positive for abdominal pain. Negative for diarrhea and nausea.  Genitourinary: Positive for vaginal discharge. Negative for vaginal bleeding.   Physical Exam   Blood pressure 124/70, pulse (!) 110, temperature 99 F (37.2 C), temperature source Oral, resp. rate 18, height 5\' 7"  (1.702 m), weight 255 lb (115.7 kg), last menstrual period 04/04/2016.  Physical Exam  Constitutional: She is oriented to person, place, and time. She appears well-developed and well-nourished. No distress.  HENT:  Head: Normocephalic.  GI: Soft. She exhibits no distension. There is no tenderness. There is no rebound and no guarding.  Genitourinary:  Genitourinary Comments: Dilation: Closed Effacement (%): Thick Exam by:: J Rasch NP  Musculoskeletal: Normal range of motion.  Neurological: She is alert and oriented to  person, place, and time.  Skin: Skin is warm. She is not diaphoretic.  Psychiatric: Her behavior is normal.   Fetal Tracing: Baseline: 135 bpm Variability: Moderate  Accelerations: 15x15 Decelerations: None Toco: Quiet   MAU Course  Procedures  None  MDM  Urine culture pending   Assessment and Plan   A:  1. Vaginal discharge in pregnancy in third trimester   2. Pelvic pressure in pregnancy, antepartum, third trimester     P:  Discharge home in stable condition Follow up with OB Return to MAU if symptoms worsen   Rasch, Harolyn Rutherford, NP 11/28/2016 8:22 PM

## 2016-11-28 NOTE — Discharge Instructions (Signed)

## 2016-11-30 LAB — CULTURE, OB URINE: SPECIAL REQUESTS: NORMAL

## 2016-12-01 ENCOUNTER — Encounter: Payer: Self-pay | Admitting: Women's Health

## 2016-12-03 ENCOUNTER — Telehealth: Payer: Self-pay | Admitting: Obstetrics & Gynecology

## 2016-12-03 NOTE — Telephone Encounter (Signed)
LMOVM that since she is on antibiotics it may not help with flu if she thinks that is what she has. Informed that some viruses or colds get worse before they get better and peak at day 5-7. Advised to call back if she had further questions.

## 2016-12-03 NOTE — Telephone Encounter (Signed)
Pt called stating that she has went to go see her Primary care Doctor yesterday and they told her she has Bronchitis and they prescribed her some antibiotic, Pt states that she feels like she has the flu this morning. Please contact pt

## 2016-12-08 ENCOUNTER — Ambulatory Visit (INDEPENDENT_AMBULATORY_CARE_PROVIDER_SITE_OTHER): Payer: Medicaid Other | Admitting: Women's Health

## 2016-12-08 ENCOUNTER — Encounter: Payer: Self-pay | Admitting: Women's Health

## 2016-12-08 VITALS — BP 126/80 | HR 108 | Wt 255.0 lb

## 2016-12-08 DIAGNOSIS — Z1389 Encounter for screening for other disorder: Secondary | ICD-10-CM

## 2016-12-08 DIAGNOSIS — R82998 Other abnormal findings in urine: Secondary | ICD-10-CM

## 2016-12-08 DIAGNOSIS — O26893 Other specified pregnancy related conditions, third trimester: Secondary | ICD-10-CM

## 2016-12-08 DIAGNOSIS — Z6791 Unspecified blood type, Rh negative: Secondary | ICD-10-CM

## 2016-12-08 DIAGNOSIS — O09893 Supervision of other high risk pregnancies, third trimester: Secondary | ICD-10-CM

## 2016-12-08 DIAGNOSIS — Z331 Pregnant state, incidental: Secondary | ICD-10-CM

## 2016-12-08 DIAGNOSIS — Z3483 Encounter for supervision of other normal pregnancy, third trimester: Secondary | ICD-10-CM

## 2016-12-08 DIAGNOSIS — O26899 Other specified pregnancy related conditions, unspecified trimester: Secondary | ICD-10-CM

## 2016-12-08 DIAGNOSIS — O360130 Maternal care for anti-D [Rh] antibodies, third trimester, not applicable or unspecified: Secondary | ICD-10-CM

## 2016-12-08 DIAGNOSIS — O4193X0 Disorder of amniotic fluid and membranes, unspecified, third trimester, not applicable or unspecified: Secondary | ICD-10-CM

## 2016-12-08 LAB — POCT URINALYSIS DIPSTICK
Glucose, UA: NEGATIVE
KETONES UA: NEGATIVE
Nitrite, UA: NEGATIVE
PROTEIN UA: NEGATIVE
RBC UA: NEGATIVE

## 2016-12-08 MED ORDER — RHO D IMMUNE GLOBULIN 1500 UNIT/2ML IJ SOSY
300.0000 ug | PREFILLED_SYRINGE | Freq: Once | INTRAMUSCULAR | Status: AC
  Administered 2016-12-08: 300 ug via INTRAMUSCULAR

## 2016-12-08 NOTE — Patient Instructions (Signed)
Call the office ([phone]) or go to [facility] if:  You begin to have strong, frequent contractions  Your water breaks.  Sometimes it is a big gush of fluid, sometimes it is just a trickle that keeps getting your panties wet or running down your legs  You have vaginal bleeding.  It is normal to have a small amount of spotting if your cervix was checked.   You don't feel your baby moving like normal.  If you don't, get you something to eat and drink and lay down and focus on feeling your baby move.  You should feel at least 10 movements in 2 hours.  If you don't, you should call the office or go to [facility].    Tdap Vaccine  It is recommended that you get the Tdap vaccine during the third trimester of EACH pregnancy to help protect your baby from getting pertussis (whooping cough)  27-36 weeks is the BEST time to do this so that you can pass the protection on to your baby. During pregnancy is better than after pregnancy, but if you are unable to get it during pregnancy it will be offered at the hospital.   You can get this vaccine at the health department or your family doctor  Everyone who will be around your baby should also be up-to-date on their vaccines. Adults (who are not pregnant) only need 1 dose of Tdap during adulthood.     Preterm Labor and Birth Information The normal length of a pregnancy is 39-41 weeks. Preterm labor is when labor starts before 37 completed weeks of pregnancy. What are the risk factors for preterm labor? Preterm labor is more likely to occur in women who:  Have certain infections during pregnancy such as a bladder infection, sexually transmitted infection, or infection inside the uterus (chorioamnionitis).  Have a shorter-than-normal cervix.  Have gone into preterm labor before.  Have had surgery on their cervix.  Are younger than age 17 or older than age 35.  Are African American.  Are pregnant with twins or multiple babies (multiple  gestation).  Take street drugs or smoke while pregnant.  Do not gain enough weight while pregnant.  Became pregnant shortly after having been pregnant.  What are the symptoms of preterm labor? Symptoms of preterm labor include:  Cramps similar to those that can happen during a menstrual period. The cramps may happen with diarrhea.  Pain in the abdomen or lower back.  Regular uterine contractions that may feel like tightening of the abdomen.  A feeling of increased pressure in the pelvis.  Increased watery or bloody mucus discharge from the vagina.  Water breaking (ruptured amniotic sac).  Why is it important to recognize signs of preterm labor? It is important to recognize signs of preterm labor because babies who are born prematurely may not be fully developed. This can put them at an increased risk for:  Long-term (chronic) heart and lung problems.  Difficulty immediately after birth with regulating body systems, including blood sugar, body temperature, heart rate, and breathing rate.  Bleeding in the brain.  Cerebral palsy.  Learning difficulties.  Death.  These risks are highest for babies who are born before 34 weeks of pregnancy. How is preterm labor treated? Treatment depends on the length of your pregnancy, your condition, and the health of your baby. It may involve:  Having a stitch (suture) placed in your cervix to prevent your cervix from opening too early (cerclage).  Taking or being given medicines,   such as: ? Hormone medicines. These may be given early in pregnancy to help support the pregnancy. ? Medicine to stop contractions. ? Medicines to help mature the baby's lungs. These may be prescribed if the risk of delivery is high. ? Medicines to prevent your baby from developing cerebral palsy.  If the labor happens before 34 weeks of pregnancy, you may need to stay in the hospital. What should I do if I think I am in preterm labor? If you think that you  are going into preterm labor, call your health care provider right away. How can I prevent preterm labor in future pregnancies? To increase your chance of having a full-term pregnancy:  Do not use any tobacco products, such as cigarettes, chewing tobacco, and e-cigarettes. If you need help quitting, ask your health care provider.  Do not use street drugs or medicines that have not been prescribed to you during your pregnancy.  Talk with your health care provider before taking any herbal supplements, even if you have been taking them regularly.  Make sure you gain a healthy amount of weight during your pregnancy.  Watch for infection. If you think that you might have an infection, get it checked right away.  Make sure to tell your health care provider if you have gone into preterm labor before.  This information is not intended to replace advice given to you by your health care provider. Make sure you discuss any questions you have with your health care provider. Document Released: 08/21/2003 Document Revised: 11/11/2015 Document Reviewed: 10/22/2015 Elsevier Interactive Patient Education  2018 [employer]  

## 2016-12-08 NOTE — Progress Notes (Signed)
Low-risk OB appointment G2P1001 7485w2d Estimated Date of Delivery: 01/31/17 BP 126/80   Pulse (!) 108   Wt 255 lb (115.7 kg)   LMP 04/04/2016 (Exact Date)   BMI 39.94 kg/m   BP, weight, and urine reviewed.  Refer to obstetrical flow sheet for FH & FHR.  Reports good fm.  Denies regular uc's, lof, vb, or uti s/s. [facility] lower pelvic pain, concerned b/c urine cx on MyChart showed 'multiple species present, suggest recollection'. She is asymptomatic. Will resend urine cx today. Also concerned urinalysis showed cystals, and she googled it and said it could be kidney stones. Discussed this is possible. She is not distressed w/ her pain, she is talking/interacting fine, no blood in urine- so not likely. Pain sounds more like RLP at this time. Discussed if pain worsens to let us know.  Reports [facility] that her fluid looked low. She also reports having to change underwear few times a day b/c it is 'uncomfortably moist'. Denies big gushes, leaking down legs. Denies abnormal d/c, itching/odor/irritation.  SSE: cx visually closed, no pooling, no change w/ valsalva, fern & nitrazine neg Will schedule for limited u/s to assess AFI Reviewed ptl s/s, fkc. Plan:  Continue routine obstetrical care  F/U this week for AFI u/s (no visit), then 2wks for OB appointment  Rhogam today

## 2016-12-10 ENCOUNTER — Encounter: Payer: Self-pay | Admitting: Obstetrics & Gynecology

## 2016-12-10 ENCOUNTER — Ambulatory Visit (INDEPENDENT_AMBULATORY_CARE_PROVIDER_SITE_OTHER): Payer: Medicaid Other

## 2016-12-10 DIAGNOSIS — A749 Chlamydial infection, unspecified: Secondary | ICD-10-CM

## 2016-12-10 DIAGNOSIS — O4193X0 Disorder of amniotic fluid and membranes, unspecified, third trimester, not applicable or unspecified: Secondary | ICD-10-CM

## 2016-12-10 DIAGNOSIS — R030 Elevated blood-pressure reading, without diagnosis of hypertension: Secondary | ICD-10-CM

## 2016-12-10 LAB — URINE CULTURE: ORGANISM ID, BACTERIA: NO GROWTH

## 2016-12-10 NOTE — Progress Notes (Signed)
US 32+4 wks,cephalic,ant pl gr 3,afi 10.3 cm,normal ovaries bilat,fhr 135 bpm,

## 2016-12-16 ENCOUNTER — Inpatient Hospital Stay (HOSPITAL_COMMUNITY)
Admission: AD | Admit: 2016-12-16 | Discharge: 2016-12-17 | Disposition: A | Discharge status: Home / Self Care | Payer: Medicaid Other | Source: Ambulatory Visit | Attending: Obstetrics and Gynecology | Admitting: Obstetrics and Gynecology

## 2016-12-16 ENCOUNTER — Encounter (HOSPITAL_COMMUNITY): Payer: Self-pay

## 2016-12-16 DIAGNOSIS — F17210 Nicotine dependence, cigarettes, uncomplicated: Secondary | ICD-10-CM | POA: Diagnosis not present

## 2016-12-16 DIAGNOSIS — O26893 Other specified pregnancy related conditions, third trimester: Secondary | ICD-10-CM | POA: Insufficient documentation

## 2016-12-16 DIAGNOSIS — Z3A33 33 weeks gestation of pregnancy: Secondary | ICD-10-CM | POA: Diagnosis not present

## 2016-12-16 DIAGNOSIS — O99333 Smoking (tobacco) complicating pregnancy, third trimester: Secondary | ICD-10-CM | POA: Insufficient documentation

## 2016-12-16 DIAGNOSIS — R1011 Right upper quadrant pain: Secondary | ICD-10-CM | POA: Diagnosis present

## 2016-12-16 DIAGNOSIS — R109 Unspecified abdominal pain: Secondary | ICD-10-CM

## 2016-12-16 HISTORY — DX: Headache: R51

## 2016-12-16 HISTORY — DX: Headache, unspecified: R51.9

## 2016-12-16 HISTORY — DX: Acute upper respiratory infection, unspecified: J06.9

## 2016-12-16 HISTORY — DX: Dyspnea, unspecified: R06.00

## 2016-12-16 HISTORY — DX: Unspecified asthma, uncomplicated: J45.909

## 2016-12-16 HISTORY — DX: Essential (primary) hypertension: I10

## 2016-12-16 HISTORY — DX: Unspecified abnormal cytological findings in specimens from vagina: R87.629

## 2016-12-16 LAB — URINALYSIS, ROUTINE W REFLEX MICROSCOPIC
BILIRUBIN URINE: NEGATIVE
Glucose, UA: NEGATIVE mg/dL
HGB URINE DIPSTICK: NEGATIVE
Ketones, ur: NEGATIVE mg/dL
NITRITE: NEGATIVE
PH: 6 (ref 5.0–8.0)
Protein, ur: 30 mg/dL — AB
SPECIFIC GRAVITY, URINE: 1.026 (ref 1.005–1.030)

## 2016-12-16 LAB — CBC
HCT: 34 % — ABNORMAL LOW (ref 36.0–46.0)
HEMOGLOBIN: 11.8 g/dL — AB (ref 12.0–15.0)
MCH: 30.3 pg (ref 26.0–34.0)
MCHC: 34.7 g/dL (ref 30.0–36.0)
MCV: 87.2 fL (ref 78.0–100.0)
PLATELETS: 212 10*3/uL (ref 150–400)
RBC: 3.9 MIL/uL (ref 3.87–5.11)
RDW: 14 % (ref 11.5–15.5)
WBC: 14 10*3/uL — ABNORMAL HIGH (ref 4.0–10.5)

## 2016-12-16 LAB — PROTEIN / CREATININE RATIO, URINE
CREATININE, URINE: 218 mg/dL
Protein Creatinine Ratio: 0.12 mg/mg{Cre} (ref 0.00–0.15)
TOTAL PROTEIN, URINE: 26 mg/dL

## 2016-12-16 LAB — COMPREHENSIVE METABOLIC PANEL
ALK PHOS: 113 U/L (ref 38–126)
ALT: 10 U/L — AB (ref 14–54)
ANION GAP: 8 (ref 5–15)
AST: 12 U/L — ABNORMAL LOW (ref 15–41)
Albumin: 3.1 g/dL — ABNORMAL LOW (ref 3.5–5.0)
BILIRUBIN TOTAL: 0.2 mg/dL — AB (ref 0.3–1.2)
BUN: 6 mg/dL (ref 6–20)
CALCIUM: 8.9 mg/dL (ref 8.9–10.3)
CO2: 19 mmol/L — ABNORMAL LOW (ref 22–32)
CREATININE: 0.39 mg/dL — AB (ref 0.44–1.00)
Chloride: 107 mmol/L (ref 101–111)
GFR calc non Af Amer: >60 mL/min (ref >60)
GLUCOSE: 94 mg/dL (ref 65–99)
Potassium: 3.7 mmol/L (ref 3.5–5.1)
Sodium: 134 mmol/L — ABNORMAL LOW (ref 135–145)
TOTAL PROTEIN: 7.2 g/dL (ref 6.5–8.1)

## 2016-12-16 NOTE — MAU Provider Note (Signed)
History     CSN: [id number]  Arrival date and time: 12/16/16 2239   First Provider Initiated Contact with Patient 12/16/16 2323      No chief complaint on file.  HPI [name] is a 21 y.o. G2P1001 at [redacted]w[redacted]d who presents to MAU today with complaint of RUQ abdominal pain since yesterday. The patient states pain is moderate and unrelieved with Tylenol. She denies vaginal bleeding, LOF, contractions, headache, blurred vision or peripheral edema. She reports normal FM.   OB History    Gravida Para Term Preterm AB Living   2 1 1     1    SAB TAB Ectopic Multiple Live Births           1      Past Medical History:  Diagnosis Date  . Anxiety   . Asthma    childhood  . Depression    post partum  . Dyspnea   . Headache   . Hypertension   . Irregular menstrual bleeding 01/31/2014  . Recurrent upper respiratory infection (URI)    Bronchitis  . Vaginal Pap smear, abnormal    HPV    Past Surgical History:  Procedure Laterality Date  . CHOLECYSTECTOMY    . CYST REMOVAL NECK    . TYMPANOSTOMY TUBE PLACEMENT    . TYMPANOSTOMY TUBE PLACEMENT      Family History  Problem Relation Age of Onset  . Bipolar disorder Maternal Grandmother   . Heart disease Maternal Grandfather   . Thyroid disease Paternal Grandmother   . Arthritis Mother   . Fibromyalgia Mother   . Diabetes Mother   . Birth defects Father     Social History  Substance Use Topics  . Smoking status: Current Every Day Smoker    Packs/day: 0.50    Years: 4.00    Types: Cigarettes  . Smokeless tobacco: Never Used     Comment: trying to quit-educated on smoking during pregnancy  . Alcohol use No     Comment: socially    Allergies: No Known Allergies  Prescriptions Prior to Admission  Medication Sig Dispense Refill Last Dose  . cefPROZIL (CEFZIL) 250 MG tablet Take 250 mg by mouth 2 (two) times daily.   Past Month at Unknown time  . escitalopram (LEXAPRO) 10 MG tablet Take 1 tablet (10 mg total) by  mouth daily. 30 tablet 6 Past Week at Unknown time  . omeprazole (PRILOSEC) 20 MG capsule Take 1 capsule (20 mg total) by mouth daily. 30 capsule 6 Past Week at Unknown time  . loratadine (CLARITIN) 10 MG tablet Take 10 mg by mouth daily.   More than a month at Unknown time  . promethazine (PHENERGAN) 25 MG tablet Take 0.5-1 tablets (12.5-25 mg total) by mouth every 6 (six) hours as needed for nausea or vomiting. 30 tablet 0 More than a month at Unknown time    Review of Systems  Constitutional: Negative for fever.  Eyes: Negative for visual disturbance.  Cardiovascular: Negative for leg swelling.  Gastrointestinal: Positive for abdominal pain. Negative for constipation, diarrhea, nausea and vomiting.  Neurological: Negative for headaches.   Physical Exam   Blood pressure 133/84, pulse (!) 110, temperature 98.5 F (36.9 C), temperature source Oral, resp. rate 18, height 5\' 7"  (1.702 m), weight 250 lb (113.4 kg), last menstrual period 04/04/2016.  Physical Exam  Nursing note and vitals reviewed. Constitutional: She is oriented to person, place, and time. She appears well-developed and well-nourished. No distress.  HENT:  Head: Normocephalic and atraumatic.  Cardiovascular: Normal rate.   Respiratory: Effort normal.  GI: Soft. She exhibits no distension and no mass. There is no tenderness. There is no rebound and no guarding.  Musculoskeletal: She exhibits no edema.  Neurological: She is alert and oriented to person, place, and time. She has normal reflexes.  No clonus  Skin: Skin is warm and dry. No erythema.  Psychiatric: She has a normal mood and affect.     Results for orders placed or performed during the hospital encounter of 12/16/16 (from the past 24 hour(s))  Urinalysis, Routine w reflex microscopic     Status: Abnormal   Collection Time: 12/16/16 10:48 PM  Result Value Ref Range   Color, Urine YELLOW YELLOW   APPearance CLOUDY (A) CLEAR   Specific Gravity, Urine 1.026  1.005 - 1.030   pH 6.0 5.0 - 8.0   Glucose, UA NEGATIVE NEGATIVE mg/dL   Hgb urine dipstick NEGATIVE NEGATIVE   Bilirubin Urine NEGATIVE NEGATIVE   Ketones, ur NEGATIVE NEGATIVE mg/dL   Protein, ur 30 (A) NEGATIVE mg/dL   Nitrite NEGATIVE NEGATIVE   Leukocytes, UA LARGE (A) NEGATIVE   RBC / HPF 0-5 0 - 5 RBC/hpf   WBC, UA 6-30 0 - 5 WBC/hpf   Bacteria, UA RARE (A) NONE SEEN   Squamous Epithelial / LPF 6-30 (A) NONE SEEN   Mucous PRESENT    Ca Oxalate Crys, UA PRESENT   Protein / creatinine ratio, urine     Status: None   Collection Time: 12/16/16 10:48 PM  Result Value Ref Range   Creatinine, Urine 218.00 mg/dL   Total Protein, Urine 26 mg/dL   Protein Creatinine Ratio 0.12 0.00 - 0.15 mg/mg[Cre]  Comprehensive metabolic panel     Status: Abnormal   Collection Time: 12/16/16 11:21 PM  Result Value Ref Range   Sodium 134 (L) 135 - 145 mmol/L   Potassium 3.7 3.5 - 5.1 mmol/L   Chloride 107 101 - 111 mmol/L   CO2 19 (L) 22 - 32 mmol/L   Glucose, Bld 94 65 - 99 mg/dL   BUN 6 6 - 20 mg/dL   Creatinine, Ser 2.13 (L) 0.44 - 1.00 mg/dL   Calcium 8.9 8.9 - 08.6 mg/dL   Total Protein 7.2 6.5 - 8.1 g/dL   Albumin 3.1 (L) 3.5 - 5.0 g/dL   AST 12 (L) 15 - 41 U/L   ALT 10 (L) 14 - 54 U/L   Alkaline Phosphatase 113 38 - 126 U/L   Total Bilirubin 0.2 (L) 0.3 - 1.2 mg/dL   GFR calc non Af Amer >60 >60 mL/min   GFR calc Af Amer >60 >60 mL/min   Anion gap 8 5 - 15  CBC     Status: Abnormal   Collection Time: 12/16/16 11:21 PM  Result Value Ref Range   WBC 14.0 (H) 4.0 - 10.5 K/uL   RBC 3.90 3.87 - 5.11 MIL/uL   Hemoglobin 11.8 (L) 12.0 - 15.0 g/dL   HCT 57.8 (L) 46.9 - 62.9 %   MCV 87.2 78.0 - 100.0 fL   MCH 30.3 26.0 - 34.0 pg   MCHC 34.7 30.0 - 36.0 g/dL   RDW 52.8 41.3 - 24.4 %   Platelets 212 150 - 400 K/uL    Patient Vitals for the past 24 hrs:  BP Temp Temp src Pulse Resp Height Weight  12/17/16 0001 133/84 - - (!) 110 - - -  12/16/16 2345 135/73 - - Marland Kitchen)  108 - - -   12/16/16 2330 138/77 - - (!) 105 - - -  12/16/16 2321 137/85 - - (!) 101 - - -  12/16/16 2310 140/76 - - (!) 103 - - -  12/16/16 2304 (!) 162/98 98.5 F (36.9 C) Oral (!) 109 18 5\' 7"  (1.702 m) 250 lb (113.4 kg)  12/16/16 2257 (!) 145/88 - - (!) 109 - - -     Fetal Monitoring: Baseline: 130 bpm Variability: moderate Accelerations: 15 x 15 Decelerations: none Contractions: none  MAU Course  Procedures None  MDM Serial BPs CBC, CMP, Urine Protein/creatinine ratio Discussed with Dr. Jolayne Pantheronstant. Ok for discharge with precautions. Follow-up with [facility] as scheduled.   Assessment and Plan  A: SIUP at 3767w3d RUQ abdominal pain Possible CHTN vs GHTN  P:  Discharge home Preterm labor and pre-eclampsia precautions discussed Patient advised to follow-up with FT as scheduled or sooner if symptoms worsen Patient may return to MAU as needed or if her condition were to change or worsen   Vonzella NippleJulie Wenzel, PA-C 12/17/2016, 12:21 AM

## 2016-12-17 DIAGNOSIS — O26893 Other specified pregnancy related conditions, third trimester: Secondary | ICD-10-CM

## 2016-12-17 DIAGNOSIS — R109 Unspecified abdominal pain: Secondary | ICD-10-CM | POA: Diagnosis not present

## 2016-12-17 NOTE — Discharge Instructions (Signed)
Hypertension During Pregnancy °Hypertension is also called high blood pressure. High blood pressure means that the force of your blood moving in your body is too strong. When you are pregnant, this condition should be watched carefully. It can cause problems for you and your baby. °Follow these instructions at home: °Eating and drinking °· Drink enough fluid to keep your pee (urine) clear or pale yellow. °· Eat healthy foods that are low in salt (sodium). °? Do not add salt to your food. °? Check labels on foods and drinks to see much salt is in them. Look on the label where you see "Sodium." °Lifestyle °· Do not use any products that contain nicotine or tobacco, such as cigarettes and e-cigarettes. If you need help quitting, ask your doctor. °· Do not use alcohol. °· Avoid caffeine. °· Avoid stress. Rest and get plenty of sleep. °General instructions °· Take over-the-counter and prescription medicines only as told by your doctor. °· While lying down, lie on your left side. This keeps pressure off your baby. °· While sitting or lying down, raise (elevate) your feet. Try putting some pillows under your lower legs. °· Exercise regularly. Ask your doctor what kinds of exercise are best for you. °· Keep all prenatal and follow-up visits as told by your doctor. This is important. °Contact a doctor if: °· You have symptoms that your doctor told you to watch for, such as: °? Fever. °? Throwing up (vomiting). °? Headache. °Get help right away if: °· You have very bad pain in your belly (abdomen). °· You are throwing up, and this does not get better with treatment. °· You suddenly get swelling in your hands, ankles, or face. °· You gain 4 lb (1.8 kg) or more in 1 week. °· You get bleeding from your vagina. °· You have blood in your pee. °· You do not feel your baby moving as much as normal. °· You have a change in vision. °· You have muscle twitching or sudden tightening (spasms). °· You have trouble breathing. °· Your lips  or fingernails turn blue. °This information is not intended to replace advice given to you by your health care provider. Make sure you discuss any questions you have with your health care provider. °Document Released: 07/03/2010 Document Revised: 02/10/2016 Document Reviewed: 02/10/2016 °Elsevier Interactive Patient Education © 2017 [employer] °Preeclampsia and Eclampsia °Preeclampsia is a serious condition that develops only during pregnancy. It is also called toxemia of pregnancy. This condition causes high blood pressure along with other symptoms, such as swelling and headaches. These symptoms may develop as the condition gets worse. Preeclampsia may occur at 20 weeks of pregnancy or later. °Diagnosing and treating preeclampsia early is very important. If not treated early, it can cause serious problems for you and your baby. One problem it can lead to is eclampsia, which is a condition that causes muscle jerking or shaking (convulsions or seizures) in the mother. Delivering your baby is the best treatment for preeclampsia or eclampsia. Preeclampsia and eclampsia symptoms usually go away after your baby is born. °What are the causes? °The cause of preeclampsia is not known. °What increases the risk? °The following risk factors make you more likely to develop preeclampsia: °· Being pregnant for the first time. °· Having had preeclampsia during a past pregnancy. °· Having a family history of preeclampsia. °· Having high blood pressure. °· Being pregnant with twins or triplets. °· Being 35 or older. °· Being African-American. °· Having kidney disease or diabetes. °·   Having medical conditions such as lupus or blood diseases. °· Being very overweight (obese). ° °What are the signs or symptoms? °The earliest signs of preeclampsia are: °· High blood pressure. °· Increased protein in your urine. Your health care provider will check for this at every visit before you give birth (prenatal visit). ° °Other symptoms that  may develop as the condition gets worse include: °· Severe headaches. °· Sudden weight gain. °· Swelling of the hands, face, legs, and feet. °· Nausea and vomiting. °· Vision problems, such as blurred or double vision. °· Numbness in the face, arms, legs, and feet. °· Urinating less than usual. °· Dizziness. °· Slurred speech. °· Abdominal pain, especially upper abdominal pain. °· Convulsions or seizures. ° °Symptoms generally go away after giving birth. °How is this diagnosed? °There are no screening tests for preeclampsia. Your health care provider will ask you about symptoms and check for signs of preeclampsia during your prenatal visits. You may also have tests that include: °· Urine tests. °· Blood tests. °· Checking your blood pressure. °· Monitoring your baby’s heart rate. °· Ultrasound. ° °How is this treated? °You and your health care provider will determine the treatment approach that is best for you. Treatment may include: °· Having more frequent prenatal exams to check for signs of preeclampsia, if you have an increased risk for preeclampsia. °· Bed rest. °· Reducing how much salt (sodium) you eat. °· Medicine to lower your blood pressure. °· Staying in the hospital, if your condition is severe. There, treatment will focus on controlling your blood pressure and the amount of fluids in your body (fluid retention). °· You may need to take medicine (magnesium sulfate) to prevent seizures. This medicine may be given as an injection or through an IV tube. °· Delivering your baby early, if your condition gets worse. You may have your labor started with medicine (induced), or you may have a cesarean delivery. ° °Follow these instructions at home: °Eating and drinking ° °· Drink enough fluid to keep your urine clear or pale yellow. °· Eat a healthy diet that is low in sodium. Do not add salt to your food. Check nutrition labels to see how much sodium a food or beverage contains. °· Avoid  caffeine. °Lifestyle °· Do not use any products that contain nicotine or tobacco, such as cigarettes and e-cigarettes. If you need help quitting, ask your health care provider. °· Do not use alcohol or drugs. °· Avoid stress as much as possible. Rest and get plenty of sleep. °General instructions °· Take over-the-counter and prescription medicines only as told by your health care provider. °· When lying down, lie on your side. This keeps pressure off of your baby. °· When sitting or lying down, raise (elevate) your feet. Try putting some pillows underneath your lower legs. °· Exercise regularly. Ask your health care provider what kinds of exercise are best for you. °· Keep all follow-up and prenatal visits as told by your health care provider. This is important. °How is this prevented? °To prevent preeclampsia or eclampsia from developing during another pregnancy: °· Get proper medical care during pregnancy. Your health care provider may be able to prevent preeclampsia or diagnose and treat it early. °· Your health care provider may have you take a low-dose aspirin or a calcium supplement during your next pregnancy. °· You may have tests of your blood pressure and kidney function after giving birth. °· Maintain a healthy weight. Ask your health care provider for   help managing weight gain during pregnancy. °· Work with your health care provider to manage any long-term (chronic) health conditions you have, such as diabetes or kidney problems. ° °Contact a health care provider if: °· You gain more weight than expected. °· You have headaches. °· You have nausea or vomiting. °· You have abdominal pain. °· You feel dizzy or light-headed. °Get help right away if: °· You develop sudden or severe swelling anywhere in your body. This usually happens in the legs. °· You gain 5 lbs (2.3 kg) or more during one week. °· You have severe: °? Abdominal pain. °? Headaches. °? Dizziness. °? Vision problems. °? Confusion. °? Nausea or  vomiting. °· You have a seizure. °· You have trouble moving any part of your body. °· You develop numbness in any part of your body. °· You have trouble speaking. °· You have any abnormal bleeding. °· You pass out. °This information is not intended to replace advice given to you by your health care provider. Make sure you discuss any questions you have with your health care provider. °Document Released: 05/28/2000 Document Revised: 01/27/2016 Document Reviewed: 01/05/2016 °Elsevier Interactive Patient Education © 2018 Else[employer] Inc. ° °

## 2016-12-22 ENCOUNTER — Encounter: Payer: Medicaid Other | Admitting: Advanced Practice Midwife

## 2016-12-23 ENCOUNTER — Ambulatory Visit (INDEPENDENT_AMBULATORY_CARE_PROVIDER_SITE_OTHER): Payer: Medicaid Other | Admitting: Obstetrics & Gynecology

## 2016-12-23 ENCOUNTER — Encounter: Payer: Self-pay | Admitting: Obstetrics & Gynecology

## 2016-12-23 VITALS — BP 132/76 | HR 122 | Wt 256.0 lb

## 2016-12-23 DIAGNOSIS — Z3483 Encounter for supervision of other normal pregnancy, third trimester: Secondary | ICD-10-CM

## 2016-12-23 DIAGNOSIS — Z3A34 34 weeks gestation of pregnancy: Secondary | ICD-10-CM

## 2016-12-23 DIAGNOSIS — Z1389 Encounter for screening for other disorder: Secondary | ICD-10-CM

## 2016-12-23 DIAGNOSIS — Z331 Pregnant state, incidental: Secondary | ICD-10-CM

## 2016-12-23 LAB — POCT URINALYSIS DIPSTICK
Glucose, UA: NEGATIVE
Ketones, UA: NEGATIVE
NITRITE UA: NEGATIVE
PROTEIN UA: NEGATIVE
RBC UA: NEGATIVE

## 2016-12-23 NOTE — Progress Notes (Signed)
G2P1001 5537w3d Estimated Date of Delivery: 01/31/17  Blood pressure 132/76, pulse (!) 122, weight 256 lb (116.1 kg), last menstrual period 04/04/2016.   BP weight and urine results all reviewed and noted.  Please refer to the obstetrical flow sheet for the fundal height and fetal heart rate documentation:  Patient reports good fetal movement, denies any bleeding and no rupture of membranes symptoms or regular contractions. Patient is without complaints. All questions were answered.  Orders Placed This Encounter  Procedures  . POCT urinalysis dipstick    Plan:  Continued routine obstetrical care, doing well on the lexapro 10 mg Having Braxton-Hicks daily  Return in about 2 weeks (around 01/06/2017) for LROB.

## 2017-01-01 ENCOUNTER — Inpatient Hospital Stay (HOSPITAL_COMMUNITY)
Admission: AD | Admit: 2017-01-01 | Discharge: 2017-01-02 | Disposition: A | Discharge status: Home / Self Care | Payer: Medicaid Other | Source: Ambulatory Visit | Attending: Obstetrics and Gynecology | Admitting: Obstetrics and Gynecology

## 2017-01-01 ENCOUNTER — Encounter (HOSPITAL_COMMUNITY): Payer: Self-pay | Admitting: *Deleted

## 2017-01-01 DIAGNOSIS — R0602 Shortness of breath: Secondary | ICD-10-CM

## 2017-01-01 DIAGNOSIS — Z3A35 35 weeks gestation of pregnancy: Secondary | ICD-10-CM | POA: Diagnosis not present

## 2017-01-01 DIAGNOSIS — O9989 Other specified diseases and conditions complicating pregnancy, childbirth and the puerperium: Secondary | ICD-10-CM | POA: Diagnosis not present

## 2017-01-01 DIAGNOSIS — O133 Gestational [pregnancy-induced] hypertension without significant proteinuria, third trimester: Secondary | ICD-10-CM | POA: Insufficient documentation

## 2017-01-01 DIAGNOSIS — O99333 Smoking (tobacco) complicating pregnancy, third trimester: Secondary | ICD-10-CM | POA: Diagnosis not present

## 2017-01-01 DIAGNOSIS — F17210 Nicotine dependence, cigarettes, uncomplicated: Secondary | ICD-10-CM | POA: Insufficient documentation

## 2017-01-01 DIAGNOSIS — O26893 Other specified pregnancy related conditions, third trimester: Secondary | ICD-10-CM | POA: Diagnosis not present

## 2017-01-01 LAB — CBC
HEMATOCRIT: 35.5 % — AB (ref 36.0–46.0)
HEMOGLOBIN: 12.3 g/dL (ref 12.0–15.0)
MCH: 30.4 pg (ref 26.0–34.0)
MCHC: 34.6 g/dL (ref 30.0–36.0)
MCV: 87.9 fL (ref 78.0–100.0)
Platelets: 195 10*3/uL (ref 150–400)
RBC: 4.04 MIL/uL (ref 3.87–5.11)
RDW: 14 % (ref 11.5–15.5)
WBC: 12.8 10*3/uL — AB (ref 4.0–10.5)

## 2017-01-01 LAB — COMPREHENSIVE METABOLIC PANEL
ALBUMIN: 3.1 g/dL — AB (ref 3.5–5.0)
ALT: 10 U/L — AB (ref 14–54)
AST: 14 U/L — AB (ref 15–41)
Alkaline Phosphatase: 147 U/L — ABNORMAL HIGH (ref 38–126)
Anion gap: 7 (ref 5–15)
BILIRUBIN TOTAL: 0.3 mg/dL (ref 0.3–1.2)
BUN: 6 mg/dL (ref 6–20)
CO2: 23 mmol/L (ref 22–32)
CREATININE: 0.49 mg/dL (ref 0.44–1.00)
Calcium: 9.1 mg/dL (ref 8.9–10.3)
Chloride: 106 mmol/L (ref 101–111)
GFR calc Af Amer: >60 mL/min (ref >60)
GLUCOSE: 106 mg/dL — AB (ref 65–99)
Potassium: 3.7 mmol/L (ref 3.5–5.1)
Sodium: 136 mmol/L (ref 135–145)
TOTAL PROTEIN: 6.8 g/dL (ref 6.5–8.1)

## 2017-01-01 LAB — URINALYSIS, ROUTINE W REFLEX MICROSCOPIC
Glucose, UA: >=500 mg/dL — AB
Hgb urine dipstick: NEGATIVE
Ketones, ur: 5 mg/dL — AB
Nitrite: NEGATIVE
Protein, ur: 30 mg/dL — AB
SPECIFIC GRAVITY, URINE: 1.032 — AB (ref 1.005–1.030)
pH: 5 (ref 5.0–8.0)

## 2017-01-01 LAB — PROTEIN / CREATININE RATIO, URINE
CREATININE, URINE: 291 mg/dL
Protein Creatinine Ratio: 0.08 mg/mg{Cre} (ref 0.00–0.15)
Total Protein, Urine: 22 mg/dL

## 2017-01-01 NOTE — MAU Provider Note (Signed)
History     CSN: [id number]  Arrival date and time: 01/01/17 2150   First Provider Initiated Contact with Patient 01/01/17 2256      Chief Complaint  Patient presents with  . Shortness of Breath   HPI   [name] is a 21 y.o. female G2P1001 @ [redacted]w[redacted]d here in MAU with shorntess of breath. Patient is a smoker, attests to 5 cigarettes per day. The symptoms started around 8 pm while she was at work. 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 States she left work because the symptoms became worse. Currently while she is laying down she feels her symptoms have improved. Says she is now able to to speak however still feels like she Is breathing faster than normal.   OB History    Gravida Para Term Preterm AB Living   2 1 1     1   SAB TAB Ectopic Multiple Live Births           1      Past Medical History:  Diagnosis Date  . Anxiety   . Asthma    childhood  . Depression    post partum  . Dyspnea   . Headache   . Hypertension   . Irregular menstrual bleeding 01/31/2014  . Recurrent upper respiratory infection (URI)    Bronchitis  . Vaginal Pap smear, abnormal    HPV    Past Surgical History:  Procedure Laterality Date  . CHOLECYSTECTOMY    . CYST REMOVAL NECK    . TYMPANOSTOMY TUBE PLACEMENT    . TYMPANOSTOMY TUBE PLACEMENT      Family History  Problem Relation Age of Onset  . Bipolar disorder Maternal Grandmother   . Heart disease Maternal Grandfather   . Thyroid disease Paternal Grandmother   . Arthritis Mother   . Fibromyalgia Mother   . Diabetes Mother   . Birth defects Father     Social History  Substance Use Topics  . Smoking status: Current Every Day Smoker    Packs/day: 0.50    Years: 4.00    Types: Cigarettes  . Smokeless tobacco: Never Used     Comment: trying to quit-educated on smoking during pregnancy  . Alcohol use No     Comment: socially    Allergies: No Known  Allergies  Prescriptions Prior to Admission  Medication Sig Dispense Refill Last Dose  . escitalopram (LEXAPRO) 10 MG tablet Take 1 tablet (10 mg total) by mouth daily. 30 tablet 6 12/31/2016 at Unknown time  . omeprazole (PRILOSEC) 20 MG capsule Take 1 capsule (20 mg total) by mouth daily. 30 capsule 6 12/31/2016 at Unknown time  . promethazine (PHENERGAN) 25 MG tablet Take 0.5-1 tablets (12.5-25 mg total) by mouth every 6 (six) hours as needed for nausea or vomiting. 30 tablet 0 Past Month at Unknown time  . loratadine (CLARITIN) 10 MG tablet Take 10 mg by mouth daily.   Not Taking  . ranitidine (ZANTAC) 300 MG tablet Take 350 mg by mouth at bedtime.      Results for orders placed or performed during the hospital encounter of 01/01/17 (from the past 48 hour(s))  Urinalysis, Routine w reflex microscopic     Status: Abnormal   Collection Time: 01/01/17 10:11 PM  Result Value Ref Range   Color, Urine AMBER (A) YELLOW    Comment: BIOCHEMICALS MAY BE AFFECTED BY COLOR     APPearance CLOUDY (A) CLEAR   Specific Gravity, Urine 1.032 (H) 1.005 - 1.030   pH 5.0 5.0 - 8.0   Glucose, UA >=500 (A) NEGATIVE mg/dL   Hgb urine dipstick NEGATIVE NEGATIVE   Bilirubin Urine SMALL (A) NEGATIVE   Ketones, ur 5 (A) NEGATIVE mg/dL   Protein, ur 30 (A) NEGATIVE mg/dL   Nitrite NEGATIVE NEGATIVE   Leukocytes, UA MODERATE (A) NEGATIVE   RBC / HPF 0-5 0 - 5 RBC/hpf   WBC, UA 6-30 0 - 5 WBC/hpf   Bacteria, UA RARE (A) NONE SEEN   Squamous Epithelial / LPF 6-30 (A) NONE SEEN   Mucous PRESENT    Ca Oxalate Crys, UA PRESENT   Protein / creatinine ratio, urine     Status: None   Collection Time: 01/01/17 10:11 PM  Result Value Ref Range   Creatinine, Urine 291.00 mg/dL   Total Protein, Urine 22 mg/dL    Comment: NO NORMAL RANGE ESTABLISHED FOR THIS TEST   Protein Creatinine Ratio 0.08 0.00 - 0.15 mg/mg[Cre]  CBC     Status: Abnormal   Collection Time: 01/01/17 11:10 PM  Result Value Ref Range   WBC 12.8 (H)  4.0 - 10.5 K/uL   RBC 4.04 3.87 - 5.11 MIL/uL   Hemoglobin 12.3 12.0 - 15.0 g/dL   HCT 35.5 (L) 36.0 - 46.0 %   MCV 87.9 78.0 - 100.0 fL   MCH 30.4 26.0 - 34.0 pg   MCHC 34.6 30.0 - 36.0 g/dL   RDW 14.0 11.5 - 15.5 %   Platelets 195 150 - 400 K/uL  Comprehensive metabolic panel     Status: Abnormal   Collection Time: 01/01/17 11:10 PM  Result Value Ref Range   Sodium 136 135 - 145 mmol/L   Potassium 3.7 3.5 - 5.1 mmol/L   Chloride 106 101 - 111 mmol/L   CO2 23 22 - 32 mmol/L   Glucose, Bld 106 (H) 65 - 99 mg/dL   BUN 6 6 - 20 mg/dL   Creatinine, Ser 0.49 0.44 - 1.00 mg/dL   Calcium 9.1 8.9 - 10.3 mg/dL   Total Protein 6.8 6.5 - 8.1 g/dL   Albumin 3.1 (L) 3.5 - 5.0 g/dL   AST 14 (L) 15 - 41 U/L   ALT 10 (L) 14 - 54 U/L   Alkaline Phosphatase 147 (H) 38 - 126 U/L   Total Bilirubin 0.3 0.3 - 1.2 mg/dL   GFR calc non Af Amer >60 >60 mL/min   GFR calc Af Amer >60 >60 mL/min    Comment: (NOTE) The eGFR has been calculated using the CKD EPI equation. This calculation has not been validated in all clinical situations. eGFR's persistently <60 mL/min signify possible Chronic Kidney Disease.    Anion gap 7 5 - 15    Review of Systems  Eyes: Positive for photophobia (Occasionally off and on for a few weeks. ).  Respiratory: Positive for shortness of breath.   Gastrointestinal: Negative for abdominal pain.  Neurological: Negative for dizziness and headaches.   Physical Exam   Blood pressure (!) 141/85, pulse (!) 110, temperature 98.6 F (37 C), height 5' 7" (1.702 m), weight 257 lb (116.6 kg), last menstrual period 04/04/2016, SpO2 100 %.  Patient Vitals for the past 24 hrs:  BP Temp Pulse Resp SpO2 Height Weight  01/02/17 0019 - 98.7 F (37.1 C) - 18 - - -  01/02/17 0001 124/88 - (!) 105 - - - -  01/01/17 2331 132/73 - (!)   118 - - - -  01/01/17 2301 136/83 - (!) 122 - - - -  01/01/17 2246 (!) 141/85 - (!) 110 - - - -  01/01/17 2245 140/82 - (!) 119 - - - -  01/01/17 2231  - - (!) 114 - - - -  01/01/17 2208 - - (!) 123 - 100 % - -  01/01/17 2202 136/84 98.6 F (37 C) (!) 140 - 100 % 5' 7" (1.702 m) 257 lb (116.6 kg)    Physical Exam  Constitutional: She is oriented to person, place, and time. She appears well-developed and well-nourished. No distress.  HENT:  Head: Normocephalic.  Cardiovascular: Normal rate.   Respiratory: Effort normal and breath sounds normal.  Genitourinary:  Genitourinary Comments: Cervix: 1 cm, thick, anterior   Musculoskeletal: Normal range of motion.  Neurological: She is alert and oriented to person, place, and time. She has normal reflexes. She displays normal reflexes. She exhibits normal muscle tone.  Negative clonus   Skin: Skin is warm. She is not diaphoretic.  Psychiatric: Her behavior is normal.   Fetal Tracing: Baseline: 130 bpm Variability: moderate  Accelerations: 15x15 Decelerations: none Toco: quiet   MAU Course  Procedures  None  MDM  Pulse ox reads 100% on RA Tachycardia; however this is patient's baseline.  Graham labs WNL Patient sitting up in the bed conversing normally, no sign of distress. States she is feeling much better.   Assessment and Plan   A:  1. Shortness of breath   2. Pregnancy-induced hypertension in third trimester vs. Preexisting hypertension     P:  Discharge home in stable condition Strict return precautions Keep your appointment with the office on Wednesday Increase PO fluid intake  Nazair Fortenberry, Artist Pais, NP 01/02/2017 12:28 AM

## 2017-01-01 NOTE — MAU Note (Signed)
About 2000 got alittle short of breath and got better. About 30mins ago was at working talking on phone and got short of breath. Was gasping for breath. Vomited on way here. Now hurt everywhere from my chest up. Denies LOF or bleeding. Some vag d/c. Dull back ache for several day

## 2017-01-01 NOTE — Progress Notes (Signed)
efm removed by Blanche EastJ Rasch NP after reviewing EFM strip

## 2017-01-01 NOTE — MAU Note (Signed)
States in past wk have had some visual hallucinations. Seeing things that are not there and random flashes of light. Sometimes feels like "bugs are crawling all over me". Taking Lexapro for about 2 months

## 2017-01-02 ENCOUNTER — Telehealth: Payer: Self-pay | Admitting: Obstetrics and Gynecology

## 2017-01-02 ENCOUNTER — Encounter: Payer: Self-pay | Admitting: Obstetrics and Gynecology

## 2017-01-02 DIAGNOSIS — O9989 Other specified diseases and conditions complicating pregnancy, childbirth and the puerperium: Secondary | ICD-10-CM

## 2017-01-02 DIAGNOSIS — R0602 Shortness of breath: Secondary | ICD-10-CM | POA: Diagnosis not present

## 2017-01-02 DIAGNOSIS — I10 Essential (primary) hypertension: Secondary | ICD-10-CM | POA: Insufficient documentation

## 2017-01-02 DIAGNOSIS — R000 Tachycardia, unspecified: Secondary | ICD-10-CM | POA: Insufficient documentation

## 2017-01-02 DIAGNOSIS — R9431 Abnormal electrocardiogram [ECG] [EKG]: Secondary | ICD-10-CM | POA: Insufficient documentation

## 2017-01-02 NOTE — Discharge Instructions (Signed)
Hypertension During Pregnancy Hypertension is also called high blood pressure. High blood pressure means that the force of your blood moving in your body is too strong. When you are pregnant, this condition should be watched carefully. It can cause problems for you and your baby. Follow these instructions at home: Eating and drinking  Drink enough fluid to keep your pee (urine) clear or pale yellow.  Eat healthy foods that are low in salt (sodium). ? Do not add salt to your food. ? Check labels on foods and drinks to see much salt is in them. Look on the label where you see "Sodium." Lifestyle  Do not use any products that contain nicotine or tobacco, such as cigarettes and e-cigarettes. If you need help quitting, ask your doctor.  Do not use alcohol.  Avoid caffeine.  Avoid stress. Rest and get plenty of sleep. General instructions  Take over-the-counter and prescription medicines only as told by your doctor.  While lying down, lie on your left side. This keeps pressure off your baby.  While sitting or lying down, raise (elevate) your feet. Try putting some pillows under your lower legs.  Exercise regularly. Ask your doctor what kinds of exercise are best for you.  Keep all prenatal and follow-up visits as told by your doctor. This is important. Contact a doctor if:  You have symptoms that your doctor told you to watch for, such as: ? Fever. ? Throwing up (vomiting). ? Headache. Get help right away if:  You have very bad pain in your belly (abdomen).  You are throwing up, and this does not get better with treatment.  You suddenly get swelling in your hands, ankles, or face.  You gain 4 lb (1.8 kg) or more in 1 week.  You get bleeding from your vagina.  You have blood in your pee.  You do not feel your baby moving as much as normal.  You have a change in vision.  You have muscle twitching or sudden tightening (spasms).  You have trouble breathing.  Your lips  or fingernails turn blue. This information is not intended to replace advice given to you by your health care provider. Make sure you discuss any questions you have with your health care provider. Document Released: 07/03/2010 Document Revised: 02/10/2016 Document Reviewed: 02/10/2016 Elsevier Interactive Patient Education  2017 Elsevier Inc.  

## 2017-01-02 NOTE — Progress Notes (Signed)
Danielle CarbonJennifer Rasch NP in earlier to discuss lab results and d/c plan. Written and verbal d/c instructions given and understanding voiced.

## 2017-01-02 NOTE — Telephone Encounter (Signed)
Called the patient this morning to check on her status. Spoke to the patient over the phone, she states "I just woke up" " I feel so much better, no more shortness of breath" "I had a lot of anxiety last night and having you check my cervix made me feel much better". She states she is on her way to MAU because she was called by a DR to come back in. Thressa ShellerHeather Hogan CNM who is the provider in the MAU is aware that the patient is coming to MAU.     Duane Lopeasch, Jennifer I, NP 01/02/2017 11:58 AM

## 2017-01-02 NOTE — MAU Provider Note (Signed)
History      Addended note  CSN: [id number]  Arrival date and time: 01/01/17 2150   First Provider Initiated Contact with Patient 01/01/17 2256      Chief Complaint  Patient presents with  . Shortness of Breath   Shortness of Breath  Pertinent negatives include no abdominal pain, chest pain or headaches.     [name] is a 21 y.o. female G2P1001 @ 26w5dhere in MAU with shorntess of breath. Patient is a smoker, attests to 5 cigarettes per day. The symptoms started around 8 pm while she was at work. 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 States she left work because the symptoms became worse. Currently while she is laying down she feels her symptoms have improved. Says she is now able to to speak however still feels like she Is breathing faster than normal. States she cannot fully expand her lungs. Says she knows a lot of this is pregnancy related however she wanted to make sure everything was ok.   OB History    Gravida Para Term Preterm AB Living   _0 SAB TAB Ectopic Multiple Live Births           1      Past Medical History:  Diagnosis Date  . Anxiety   . Asthma    childhood  . Depression    post partum  . Dyspnea   . Headache   . Hypertension   . Irregular menstrual bleeding 01/31/2014  . Recurrent upper respiratory infection (URI)    Bronchitis  . Vaginal Pap smear, abnormal    HPV    Past Surgical History:  Procedure Laterality Date  . CHOLECYSTECTOMY    . CYST REMOVAL NECK    . TYMPANOSTOMY TUBE PLACEMENT    . TYMPANOSTOMY TUBE PLACEMENT      Family History  Problem Relation Age of Onset  . Bipolar disorder Maternal Grandmother   . Heart disease Maternal Grandfather   . Thyroid disease Paternal Grandmother   . Arthritis Mother   . Fibromyalgia Mother   . Diabetes Mother   . Birth defects Father     Social History  Substance Use Topics  . Smoking status:  Current Every Day Smoker    Packs/day: 0.50    Years: 4.00    Types: Cigarettes  . Smokeless tobacco: Never Used     Comment: trying to quit-educated on smoking during pregnancy  . Alcohol use No     Comment: socially    Allergies: No Known Allergies  No prescriptions prior to admission.   Results for orders placed or performed during the hospital encounter of 01/01/17 (from the past 48 hour(s))  Urinalysis, Routine w reflex microscopic     Status: Abnormal   Collection Time: 01/01/17 10:11 PM  Result Value Ref Range   Color, Urine AMBER (A) YELLOW    Comment: BIOCHEMICALS MAY BE AFFECTED BY COLOR   APPearance CLOUDY (A) CLEAR   Specific Gravity, Urine 1.032 (H) 1.005 - 1.030   pH 5.0 5.0 - 8.0   Glucose, UA >=500 (A) NEGATIVE mg/dL   Hgb urine dipstick NEGATIVE NEGATIVE   Bilirubin Urine SMALL (A) NEGATIVE   Ketones, ur 5 (A) NEGATIVE mg/dL   Protein, ur 30 (A) NEGATIVE mg/dL   Nitrite NEGATIVE NEGATIVE   Leukocytes, UA MODERATE (A) NEGATIVE   RBC /  HPF 0-5 0 - 5 RBC/hpf   WBC, UA 6-30 0 - 5 WBC/hpf   Bacteria, UA RARE (A) NONE SEEN   Squamous Epithelial / LPF 6-30 (A) NONE SEEN   Mucous PRESENT    Ca Oxalate Crys, UA PRESENT   Protein / creatinine ratio, urine     Status: None   Collection Time: 01/01/17 10:11 PM  Result Value Ref Range   Creatinine, Urine 291.00 mg/dL   Total Protein, Urine 22 mg/dL    Comment: NO NORMAL RANGE ESTABLISHED FOR THIS TEST   Protein Creatinine Ratio 0.08 0.00 - 0.15 mg/mg[Cre]  CBC     Status: Abnormal   Collection Time: 01/01/17 11:10 PM  Result Value Ref Range   WBC 12.8 (H) 4.0 - 10.5 K/uL   RBC 4.04 3.87 - 5.11 MIL/uL   Hemoglobin 12.3 12.0 - 15.0 g/dL   HCT 35.5 (L) 36.0 - 46.0 %   MCV 87.9 78.0 - 100.0 fL   MCH 30.4 26.0 - 34.0 pg   MCHC 34.6 30.0 - 36.0 g/dL   RDW 14.0 11.5 - 15.5 %   Platelets 195 150 - 400 K/uL  Comprehensive metabolic panel     Status: Abnormal   Collection Time: 01/01/17 11:10 PM  Result Value Ref  Range   Sodium 136 135 - 145 mmol/L   Potassium 3.7 3.5 - 5.1 mmol/L   Chloride 106 101 - 111 mmol/L   CO2 23 22 - 32 mmol/L   Glucose, Bld 106 (H) 65 - 99 mg/dL   BUN 6 6 - 20 mg/dL   Creatinine, Ser 0.49 0.44 - 1.00 mg/dL   Calcium 9.1 8.9 - 10.3 mg/dL   Total Protein 6.8 6.5 - 8.1 g/dL   Albumin 3.1 (L) 3.5 - 5.0 g/dL   AST 14 (L) 15 - 41 U/L   ALT 10 (L) 14 - 54 U/L   Alkaline Phosphatase 147 (H) 38 - 126 U/L   Total Bilirubin 0.3 0.3 - 1.2 mg/dL   GFR calc non Af Amer >60 >60 mL/min   GFR calc Af Amer >60 >60 mL/min    Comment: (NOTE) The eGFR has been calculated using the CKD EPI equation. This calculation has not been validated in all clinical situations. eGFR's persistently <60 mL/min signify possible Chronic Kidney Disease.    Anion gap 7 5 - 15    Review of Systems  Eyes: Positive for photophobia (Occasionally off and on for a few weeks. ).  Respiratory: Positive for shortness of breath.   Cardiovascular: Negative for chest pain.  Gastrointestinal: Negative for abdominal pain.  Neurological: Negative for dizziness and headaches.   Physical Exam   Blood pressure 124/88, pulse (!) 105, temperature 98.7 F (37.1 C), resp. rate 18, height 5' 7" (1.702 m), weight 257 lb (116.6 kg), last menstrual period 04/04/2016, SpO2 100 %.  Patient Vitals for the past 24 hrs:  BP Temp Pulse Resp SpO2 Height Weight  01/02/17 0019 - 98.7 F (37.1 C) - 18 - - -  01/02/17 0001 124/88 - (!) 105 - - - -  01/01/17 2331 132/73 - (!) 118 - - - -  01/01/17 2301 136/83 - (!) 122 - - - -  01/01/17 2246 (!) 141/85 - (!) 110 - - - -  01/01/17 2245 140/82 - (!) 119 - - - -  01/01/17 2231 - - (!) 114 - - - -  01/01/17 2208 - - (!) 123 - 100 % - -  01/01/17 2202  136/84 98.6 F (37 C) (!) 140 - 100 % 5' 7" (1.702 m) 257 lb (116.6 kg)    Physical Exam  Constitutional: She is oriented to person, place, and time. She appears well-developed and well-nourished. No distress.  HENT:  Head:  Normocephalic.  Cardiovascular: Normal rate.   Respiratory: Effort normal and breath sounds normal. No respiratory distress.  Genitourinary:  Genitourinary Comments: Cervix: 1 cm, thick, anterior   Musculoskeletal: Normal range of motion.  Neurological: She is alert and oriented to person, place, and time. She has normal reflexes. She displays normal reflexes. She exhibits normal muscle tone.  Negative clonus   Skin: Skin is warm. She is not diaphoretic.  Psychiatric: Her behavior is normal.   Fetal Tracing: Baseline: 130 bpm Variability: moderate  Accelerations: 15x15 Decelerations: none Toco: quiet   MAU Course  Procedures  None  MDM  Pulse ox reads 100% on RA Tachycardia; however this is patient's baseline.  [place] labs WNL Patient sitting up in the bed conversing normally, no sign of distress. States she is feeling much better.  Says she feels better knowing that her cervix is closed, states she never felt labor pains with her last delivery and came in and was 7 cm. States she had a lot of anxiety about this.   Assessment and Plan   A:  1. Shortness of breath   2. Pregnancy-induced hypertension in third trimester vs. Preexisting hypertension     P:  Discharge home in stable condition; shortness of breath resolved. None here in MAU.  Strict return precautions Keep your appointment with the office on Wednesday Increase PO fluid intake  Rasch, Artist Pais, NP 01/02/2017 11:49 AM

## 2017-01-03 LAB — URINE CULTURE

## 2017-01-05 ENCOUNTER — Encounter: Payer: Self-pay | Admitting: Obstetrics and Gynecology

## 2017-01-05 ENCOUNTER — Ambulatory Visit (INDEPENDENT_AMBULATORY_CARE_PROVIDER_SITE_OTHER): Payer: Medicaid Other | Admitting: Obstetrics and Gynecology

## 2017-01-05 VITALS — BP 138/82 | HR 117 | Wt 256.6 lb

## 2017-01-05 DIAGNOSIS — R030 Elevated blood-pressure reading, without diagnosis of hypertension: Secondary | ICD-10-CM

## 2017-01-05 DIAGNOSIS — I10 Essential (primary) hypertension: Secondary | ICD-10-CM

## 2017-01-05 DIAGNOSIS — O09893 Supervision of other high risk pregnancies, third trimester: Secondary | ICD-10-CM

## 2017-01-05 DIAGNOSIS — Z1389 Encounter for screening for other disorder: Secondary | ICD-10-CM

## 2017-01-05 DIAGNOSIS — Z3A36 36 weeks gestation of pregnancy: Secondary | ICD-10-CM

## 2017-01-05 DIAGNOSIS — Z3483 Encounter for supervision of other normal pregnancy, third trimester: Secondary | ICD-10-CM

## 2017-01-05 DIAGNOSIS — Z331 Pregnant state, incidental: Secondary | ICD-10-CM

## 2017-01-05 LAB — POCT URINALYSIS DIPSTICK
Blood, UA: NEGATIVE
KETONES UA: NEGATIVE
Nitrite, UA: NEGATIVE

## 2017-01-05 NOTE — Progress Notes (Signed)
High Risk Pregnancy HROB Diagnosis(es):   Chronic hypertension  G2P1001 635w2d Estimated Date of Delivery: 01/31/17    HPI: Please review the MAU notes from 722 for the patient was seen for some shortness of breath and was anxious she is since recovered and is much more relaxed and heart rate is normal The patient is being seen today for ongoing management of pregnancy notable for some borderline elevation of pressures. Today she reports no shortness of breath nodyspnea Patient reports good fetal movement, denies any bleeding and no rupture of membranes symptoms or regular contractions.   BP weight and urine results reviewed and noted. Blood pressure 138/82, pulse (!) 117, weight 256 lb 9.6 oz (116.4 kg), last menstrual period 04/04/2016.  Fundal Height:  37 Fetal Heart rate:  142 Physical Examination: Abdomen - soft, nontender, nondistended, no masses or organomegaly                                     Pelvic -                                      Edema:    Urinalysis:NEGATIVE for nitrates                 POSITIVE for 1+ proteinuria  Fetal Surveillance Testing today:  None  Lab and sonogram results have been reviewed. Comments: We'll check protein creatinine ratio and collect 24-hour urine  Assessment:  1.  Pregnancy at 285w2d,  G2P1001   :                          2.  Resolved shortness of breath                        3.   Medication(s) Plans:  none  Treatment Plan:  Follow up in 2 days For appointment for high risk OB care, follow-up with 24-hour urine total protein and to obtain NST

## 2017-01-07 ENCOUNTER — Encounter: Payer: Self-pay | Admitting: Obstetrics and Gynecology

## 2017-01-07 ENCOUNTER — Ambulatory Visit (INDEPENDENT_AMBULATORY_CARE_PROVIDER_SITE_OTHER): Payer: Medicaid Other | Admitting: Obstetrics and Gynecology

## 2017-01-07 VITALS — BP 134/80 | HR 97 | Wt 256.0 lb

## 2017-01-07 DIAGNOSIS — Z331 Pregnant state, incidental: Secondary | ICD-10-CM

## 2017-01-07 DIAGNOSIS — O09893 Supervision of other high risk pregnancies, third trimester: Secondary | ICD-10-CM

## 2017-01-07 DIAGNOSIS — Z3A36 36 weeks gestation of pregnancy: Secondary | ICD-10-CM

## 2017-01-07 DIAGNOSIS — O10913 Unspecified pre-existing hypertension complicating pregnancy, third trimester: Secondary | ICD-10-CM

## 2017-01-07 DIAGNOSIS — O0993 Supervision of high risk pregnancy, unspecified, third trimester: Secondary | ICD-10-CM

## 2017-01-07 DIAGNOSIS — Z3483 Encounter for supervision of other normal pregnancy, third trimester: Secondary | ICD-10-CM

## 2017-01-07 DIAGNOSIS — Z1389 Encounter for screening for other disorder: Secondary | ICD-10-CM

## 2017-01-07 LAB — POCT URINALYSIS DIPSTICK
Glucose, UA: NEGATIVE
Ketones, UA: NEGATIVE
NITRITE UA: NEGATIVE
RBC UA: NEGATIVE

## 2017-01-07 LAB — PROTEIN, URINE, 24 HOUR
PROTEIN 24H UR: 287 mg/(24.h) — AB (ref 30–150)
PROTEIN UR: 15.5 mg/dL

## 2017-01-07 NOTE — Progress Notes (Addendum)
Patient ID: [name], female   DOB: September 18, 1995, 21 y.o.   MRN: [id number] Fetal Surveillance Testing today:  NST   High Risk Pregnancy Diagnosis(es):   CHTN  G2P1001 9050w4d Estimated Date of Delivery: 01/31/17  Blood pressure 134/80, pulse 97, weight 256 lb (116.1 kg), last menstrual period 04/04/2016.  Urinalysis: 2+ Leukocytes, otherwise negative   HPI: The patient is being seen today for ongoing management of CHTN. Today she reports no complaints at this time. Pt voices concern for the status of her 24 UA lab test. Denies any other symptoms at this time.    BP weight and urine results all reviewed and noted. Patient reports good fetal movement, denies any bleeding and no rupture of membranes symptoms or regular contractions.  Fundal Height: done this wk 37 Fetal Heart rate:  145 Edema:  Physical Examination: General appearance - alert, well appearing, and in no distress and oriented to person, place, and time Mental status - alert, oriented to person, place, and time, normal mood, behavior, speech, dress, motor activity, and thought processes CERVIX: 1 cm, long, firm  Patient is without complaints other than noted in her HPI. All questions were answered.  All lab and sonogram results have been reviewed. Comments: abnormal: 2+ leukocytes, otherwise negative  Assessment:  1.  Pregnancy at 7550w4d,  Estimated Date of Delivery: 01/31/17 :                         2.  CHTN reactive NST  Medication(s) Plans:   Treatment Plan:  GC/Chlamydia probe completed today. Follow up on Tuesday for BPP  No Follow-up on file. for appointment for high risk OB care  No orders of the defined types were placed in this encounter.  Orders Placed This Encounter  Procedures  . GC/Chlamydia Probe Amp  . Culture, beta strep (group b only)  . POCT urinalysis dipstick   By signing my name below, I, Soijett Blue, attest that this documentation has been prepared under the direction and in the  presence of Tilda BurrowFerguson, Elise Gladden V, MD. Electronically Signed: Soijett Blue, ED Scribe. 01/07/17. 9:39 AM.  I personally performed the services described in this documentation, which was SCRIBED in my presence. The recorded information has been reviewed and considered accurate. It has been edited as necessary during review. Tilda BurrowFERGUSON,Keiana Tavella V, MD

## 2017-01-10 LAB — OB RESULTS CONSOLE GBS: GBS: NEGATIVE

## 2017-01-11 ENCOUNTER — Encounter: Payer: Self-pay | Admitting: Advanced Practice Midwife

## 2017-01-11 ENCOUNTER — Ambulatory Visit (INDEPENDENT_AMBULATORY_CARE_PROVIDER_SITE_OTHER): Payer: Medicaid Other | Admitting: Advanced Practice Midwife

## 2017-01-11 ENCOUNTER — Other Ambulatory Visit: Payer: Self-pay | Admitting: Obstetrics and Gynecology

## 2017-01-11 ENCOUNTER — Ambulatory Visit (INDEPENDENT_AMBULATORY_CARE_PROVIDER_SITE_OTHER): Payer: Medicaid Other

## 2017-01-11 VITALS — BP 134/68 | HR 100 | Wt 258.5 lb

## 2017-01-11 DIAGNOSIS — R030 Elevated blood-pressure reading, without diagnosis of hypertension: Secondary | ICD-10-CM

## 2017-01-11 DIAGNOSIS — Z331 Pregnant state, incidental: Secondary | ICD-10-CM

## 2017-01-11 DIAGNOSIS — O0993 Supervision of high risk pregnancy, unspecified, third trimester: Secondary | ICD-10-CM

## 2017-01-11 DIAGNOSIS — O10913 Unspecified pre-existing hypertension complicating pregnancy, third trimester: Secondary | ICD-10-CM | POA: Diagnosis not present

## 2017-01-11 DIAGNOSIS — Z3A37 37 weeks gestation of pregnancy: Secondary | ICD-10-CM

## 2017-01-11 DIAGNOSIS — Z1389 Encounter for screening for other disorder: Secondary | ICD-10-CM

## 2017-01-11 DIAGNOSIS — A749 Chlamydial infection, unspecified: Secondary | ICD-10-CM

## 2017-01-11 LAB — GC/CHLAMYDIA PROBE AMP
Chlamydia trachomatis, NAA: NEGATIVE
Neisseria gonorrhoeae by PCR: NEGATIVE

## 2017-01-11 LAB — POCT URINALYSIS DIPSTICK
Blood, UA: NEGATIVE
Glucose, UA: NEGATIVE
Ketones, UA: NEGATIVE
NITRITE UA: NEGATIVE
PROTEIN UA: NEGATIVE

## 2017-01-11 LAB — CULTURE, BETA STREP (GROUP B ONLY): STREP GP B CULTURE: NEGATIVE

## 2017-01-11 NOTE — Progress Notes (Signed)
US 37+1 wks,cephalic,ant pl gr 3,afi 14 cm,fhr 133 bpm,BPP 8/8,RI .57,.57=55%

## 2017-01-11 NOTE — Patient Instructions (Signed)

## 2017-01-11 NOTE — Progress Notes (Signed)
Fetal Surveillance Testing today:  BPP   High Risk Pregnancy Diagnosis(es):   CHTN (based on chart review of BPs by J[facility] 7/25  G2P1001 8288w1d Estimated Date of Delivery: 01/31/17  Blood pressure 134/68, pulse 100, weight 258 lb 8 oz (117.3 kg), last menstrual period 04/04/2016.  Urinalysis: Negative   HPI: The patient is being seen today for ongoing management of the above. Today she reports no complaints.    BP weight and urine results all reviewed and noted. Patient reports good fetal movement, denies any bleeding and no rupture of membranes symptoms or regular contractions.  Edema:  trace  Patient is without complaints other than noted in her HPI. All questions were answered.  All lab and sonogram results have been reviewed. Comments: normal US 37+1 wks,cephalic,ant pl gr 3,afi 14 cm,fhr 133 bpm,BPP 8/8,RI .57,.57=55%   Assessment:  1.  Pregnancy at 5288w1d,  Estimated Date of Delivery: 01/31/17 :                          2.  CHTN                        3.    Medication(s) Plans:  none  Treatment Plan:  Twice weekly testing, IOL 40 weeks (no meds)   Return in about 3 days (around 01/14/2017) for HROB, NST. for appointment for high risk OB care  No orders of the defined types were placed in this encounter.  Orders Placed This Encounter  Procedures  . POCT Urinalysis Dipstick

## 2017-01-13 ENCOUNTER — Encounter: Payer: Medicaid Other | Admitting: Obstetrics and Gynecology

## 2017-01-13 ENCOUNTER — Encounter: Payer: Self-pay | Admitting: Obstetrics and Gynecology

## 2017-01-13 ENCOUNTER — Ambulatory Visit (INDEPENDENT_AMBULATORY_CARE_PROVIDER_SITE_OTHER): Payer: Medicaid Other | Admitting: Obstetrics and Gynecology

## 2017-01-13 VITALS — BP 130/70 | HR 136 | Wt 259.0 lb

## 2017-01-13 DIAGNOSIS — O09893 Supervision of other high risk pregnancies, third trimester: Secondary | ICD-10-CM

## 2017-01-13 DIAGNOSIS — O10913 Unspecified pre-existing hypertension complicating pregnancy, third trimester: Secondary | ICD-10-CM | POA: Diagnosis not present

## 2017-01-13 DIAGNOSIS — O0993 Supervision of high risk pregnancy, unspecified, third trimester: Secondary | ICD-10-CM

## 2017-01-13 DIAGNOSIS — Z331 Pregnant state, incidental: Secondary | ICD-10-CM

## 2017-01-13 DIAGNOSIS — Z3A37 37 weeks gestation of pregnancy: Secondary | ICD-10-CM

## 2017-01-13 DIAGNOSIS — Z1389 Encounter for screening for other disorder: Secondary | ICD-10-CM

## 2017-01-13 LAB — POCT URINALYSIS DIPSTICK
Glucose, UA: NEGATIVE
KETONES UA: NEGATIVE
Leukocytes, UA: NEGATIVE
NITRITE UA: NEGATIVE
RBC UA: NEGATIVE

## 2017-01-13 NOTE — Progress Notes (Addendum)
[name] is a 21 y.o. female  High Risk Pregnancy HROB Diagnosis(es):   CHTN  G2P1001 1768w3d Estimated Date of Delivery: 01/31/17    HPI: The patient is being seen today for ongoing management of CHTN. Today she reports no complaints. Pt had question about using evening primrose oil and pumping to induce labor. She is also concerned about controlling her BP after giving birth. wieght loss after delivery emphasized as beneficial   Patient reports good fetal movement, denies any bleeding and no rupture of membranes symptoms or regular contractions.   BP weight and urine results reviewed and noted. Blood pressure 130/70, pulse (!) 136, weight 259 lb (117.5 kg), last menstrual period 04/04/2016.  Fundal Height:  38 cm Fetal Heart rate:  150 bpm with accelerations going 155 Physical Examination: Abdomen - soft, nontender, nondistended, no masses or organomegaly                                     Pelvic - cervix is 2 cm and 20% effaced, -2                                     Edema:  None  Urinalysis: 1+ protein   Fetal Surveillance Testing today:  NST reactive     Assessment:  1.  Pregnancy at 1768w3d,  G2P1001:  Estimated Date of Delivery: 01/31/17                        2.  CHTN  Medication(s) Plans:  none  Treatment Plan:  Twice weekly testing, IOL at 40 weeks (no meds)  Follow up in 4 days for appointment for high risk OB care, CHTN, U/S    By signing my name below, I, Izna Ahmed, attest that this documentation has been prepared under the direction and in the presence of Tilda BurrowFerguson, Ashwath Lasch V, MD. Electronically Signed: Redge GainerIzna Ahmed, Medical Scribe. 01/13/17. 3:44 PM.  I personally performed the services described in this documentation, which was SCRIBED in my presence. The recorded information has been reviewed and considered accurate. It has been edited as necessary during review. Tilda BurrowFERGUSON,Lamya Lausch V, MD

## 2017-01-14 ENCOUNTER — Other Ambulatory Visit: Payer: Self-pay | Admitting: Obstetrics and Gynecology

## 2017-01-14 DIAGNOSIS — O10913 Unspecified pre-existing hypertension complicating pregnancy, third trimester: Secondary | ICD-10-CM

## 2017-01-16 ENCOUNTER — Encounter (HOSPITAL_COMMUNITY): Payer: Self-pay | Admitting: Family Medicine

## 2017-01-16 ENCOUNTER — Inpatient Hospital Stay (HOSPITAL_COMMUNITY)
Admission: AD | Admit: 2017-01-16 | Discharge: 2017-01-17 | DRG: 782 | Disposition: A | Discharge status: Home / Self Care | Payer: Medicaid Other | Source: Ambulatory Visit | Attending: Obstetrics & Gynecology | Admitting: Obstetrics & Gynecology

## 2017-01-16 DIAGNOSIS — A749 Chlamydial infection, unspecified: Secondary | ICD-10-CM

## 2017-01-16 DIAGNOSIS — Z3A37 37 weeks gestation of pregnancy: Secondary | ICD-10-CM | POA: Diagnosis not present

## 2017-01-16 DIAGNOSIS — F17210 Nicotine dependence, cigarettes, uncomplicated: Secondary | ICD-10-CM | POA: Diagnosis present

## 2017-01-16 DIAGNOSIS — Z0371 Encounter for suspected problem with amniotic cavity and membrane ruled out: Secondary | ICD-10-CM

## 2017-01-16 DIAGNOSIS — O4292 Full-term premature rupture of membranes, unspecified as to length of time between rupture and onset of labor: Secondary | ICD-10-CM | POA: Diagnosis present

## 2017-01-16 DIAGNOSIS — O99333 Smoking (tobacco) complicating pregnancy, third trimester: Secondary | ICD-10-CM | POA: Diagnosis present

## 2017-01-16 DIAGNOSIS — O133 Gestational [pregnancy-induced] hypertension without significant proteinuria, third trimester: Secondary | ICD-10-CM | POA: Diagnosis present

## 2017-01-16 DIAGNOSIS — R030 Elevated blood-pressure reading, without diagnosis of hypertension: Secondary | ICD-10-CM

## 2017-01-16 DIAGNOSIS — O4290 Premature rupture of membranes, unspecified as to length of time between rupture and onset of labor, unspecified weeks of gestation: Secondary | ICD-10-CM

## 2017-01-16 DIAGNOSIS — O10913 Unspecified pre-existing hypertension complicating pregnancy, third trimester: Secondary | ICD-10-CM

## 2017-01-16 LAB — CBC
HEMATOCRIT: 33.5 % — AB (ref 36.0–46.0)
HEMOGLOBIN: 11.6 g/dL — AB (ref 12.0–15.0)
MCH: 30.3 pg (ref 26.0–34.0)
MCHC: 34.6 g/dL (ref 30.0–36.0)
MCV: 87.5 fL (ref 78.0–100.0)
Platelets: 210 10*3/uL (ref 150–400)
RBC: 3.83 MIL/uL — ABNORMAL LOW (ref 3.87–5.11)
RDW: 14.3 % (ref 11.5–15.5)
WBC: 12.6 10*3/uL — AB (ref 4.0–10.5)

## 2017-01-16 LAB — URINALYSIS, ROUTINE W REFLEX MICROSCOPIC
Bilirubin Urine: NEGATIVE
GLUCOSE, UA: 50 mg/dL — AB
HGB URINE DIPSTICK: NEGATIVE
KETONES UR: NEGATIVE mg/dL
Leukocytes, UA: NEGATIVE
NITRITE: NEGATIVE
PH: 6 (ref 5.0–8.0)
PROTEIN: 30 mg/dL — AB
Specific Gravity, Urine: 1.027 (ref 1.005–1.030)

## 2017-01-16 LAB — TYPE AND SCREEN
ABO/RH(D): O NEG
ANTIBODY SCREEN: NEGATIVE

## 2017-01-16 LAB — POCT FERN TEST: POCT Fern Test: POSITIVE

## 2017-01-16 MED ORDER — OXYCODONE-ACETAMINOPHEN 5-325 MG PO TABS
1.0000 | ORAL_TABLET | ORAL | Status: DC | PRN
Start: 2017-01-16 — End: 2017-01-18

## 2017-01-16 MED ORDER — FENTANYL CITRATE (PF) 100 MCG/2ML IJ SOLN: 50.0000 ug | INTRAMUSCULAR | Status: DC | PRN

## 2017-01-16 MED ORDER — OXYTOCIN 10 UNIT/ML IJ SOLN: 10.0000 [IU] | Freq: Once | INTRAMUSCULAR | Status: DC

## 2017-01-16 MED ORDER — LACTATED RINGERS IV SOLN
INTRAVENOUS | Status: DC
  Administered 2017-01-16: 125 mL via INTRAVENOUS
  Administered 2017-01-17: 13:00:00 via INTRAVENOUS

## 2017-01-16 MED ORDER — ACETAMINOPHEN 325 MG PO TABS: 650.0000 mg | ORAL_TABLET | ORAL | Status: DC | PRN

## 2017-01-16 MED ORDER — OXYTOCIN 40 UNITS IN LACTATED RINGERS INFUSION - SIMPLE MED
2.5000 [IU]/h | INTRAVENOUS | Status: DC
  Filled 2017-01-16: qty 1000

## 2017-01-16 MED ORDER — OXYCODONE-ACETAMINOPHEN 5-325 MG PO TABS: 2.0000 | ORAL_TABLET | ORAL | Status: DC | PRN

## 2017-01-16 MED ORDER — LACTATED RINGERS IV SOLN: 500.0000 mL | INTRAVENOUS | Status: DC | PRN

## 2017-01-16 MED ORDER — SODIUM CHLORIDE 0.9 % IV SOLN: 250.0000 mL | INTRAVENOUS | Status: DC | PRN

## 2017-01-16 MED ORDER — SODIUM CHLORIDE 0.9% FLUSH: 3.0000 mL | INTRAVENOUS | Status: DC | PRN

## 2017-01-16 MED ORDER — HYDROXYZINE HCL 50 MG PO TABS: 50.0000 mg | ORAL_TABLET | Freq: Four times a day (QID) | ORAL | Status: DC | PRN

## 2017-01-16 MED ORDER — ACETAMINOPHEN 325 MG PO TABS
650.0000 mg | ORAL_TABLET | ORAL | Status: DC | PRN
  Administered 2017-01-17: 650 mg via ORAL
  Filled 2017-01-16: qty 2

## 2017-01-16 MED ORDER — LIDOCAINE HCL (PF) 1 % IJ SOLN: 30.0000 mL | INTRAMUSCULAR | Status: DC | PRN

## 2017-01-16 MED ORDER — OXYTOCIN BOLUS FROM INFUSION: 500.0000 mL | Freq: Once | INTRAVENOUS | Status: DC

## 2017-01-16 MED ORDER — SOD CITRATE-CITRIC ACID 500-334 MG/5ML PO SOLN: 30.0000 mL | ORAL | Status: DC | PRN

## 2017-01-16 MED ORDER — SODIUM CHLORIDE 0.9% FLUSH: 3.0000 mL | Freq: Two times a day (BID) | INTRAVENOUS | Status: DC

## 2017-01-16 MED ORDER — OXYCODONE-ACETAMINOPHEN 5-325 MG PO TABS: 1.0000 | ORAL_TABLET | ORAL | Status: DC | PRN

## 2017-01-16 MED ORDER — OXYTOCIN 40 UNITS IN LACTATED RINGERS INFUSION - SIMPLE MED
1.0000 m[IU]/min | INTRAVENOUS | Status: DC
  Administered 2017-01-16: 2 m[IU]/min via INTRAVENOUS

## 2017-01-16 MED ORDER — OXYTOCIN 40 UNITS IN LACTATED RINGERS INFUSION - SIMPLE MED: 2.5000 [IU]/h | INTRAVENOUS | Status: DC

## 2017-01-16 MED ORDER — ONDANSETRON HCL 4 MG/2ML IJ SOLN
4.0000 mg | Freq: Four times a day (QID) | INTRAMUSCULAR | Status: DC | PRN
  Administered 2017-01-17: 4 mg via INTRAVENOUS
  Filled 2017-01-16: qty 2

## 2017-01-16 MED ORDER — ONDANSETRON HCL 4 MG/2ML IJ SOLN: 4.0000 mg | Freq: Four times a day (QID) | INTRAMUSCULAR | Status: DC | PRN

## 2017-01-16 MED ORDER — LACTATED RINGERS IV SOLN
INTRAVENOUS | Status: DC
  Administered 2017-01-16: 22:00:00 via INTRAVENOUS

## 2017-01-16 MED ORDER — TERBUTALINE SULFATE 1 MG/ML IJ SOLN: 0.2500 mg | Freq: Once | INTRAMUSCULAR | Status: DC | PRN

## 2017-01-16 MED ORDER — FLEET ENEMA 7-19 GM/118ML RE ENEM: 1.0000 | ENEMA | RECTAL | Status: DC | PRN

## 2017-01-16 NOTE — Progress Notes (Signed)
Labor Progress Note [name] is a 21 y.o. G2P1001 at 2967w6d presented for SOL with SROM S: Sitting up in bed. No acute concerns  O:  BP 128/80   Pulse 88   Temp 98.5 F (36.9 C) (Oral)   Resp 18   Ht 5\' 7"  (1.702 m)   Wt 260 lb (117.9 kg)   LMP 04/04/2016 (Exact Date)   BMI 40.72 kg/m  EFM: 120/moderate/+accel/No decel  CVE: Dilation: 2 Effacement (%): 50 Station: -3 Presentation: Vertex Exam by:: Lajuana Matteina Jacobs, RN   A&P: 21 y.o. G2P1001 4067w6d SOL #Labor: Continuing pitocin titration with ambulation to stimulate active labor.  #Pain: IV pain medications. Plans epidural #FWB: cat 1 #GBS negative #gHTN. Labs WNL. Normotensive now. No medications.   John Giovanniorey P Jerron Niblack, MD 10:14 PM

## 2017-01-16 NOTE — Anesthesia Pain Management Evaluation Note (Signed)
  CRNA Pain Management Visit Note  Patient: [name], 21 y.o., female  "Hello [facility] am a [facility]. We have an anesthesia team available at all times to provide care throughout the hospital, including epidural management and anesthesia for C-section. I don't know your plan for the delivery whether it a natural birth, water birth, IV sedation, nitrous supplementation, doula or epidural, but we want to meet your pain goals."   1.Was your pain managed to your expectations on prior hospitalizations?   Yes   2.What is your expectation for pain management during this hospitalization?     Epidural and IV pain meds  3.How can we help you reach that goal? Be available  Record the patient's initial score and the patient's pain goal.   Pain: 3  Pain Goal: 5 [facility] you to be able to say your pain was always managed very well.  [facility]ERR[facility]Danielle Len 01/16/2017

## 2017-01-16 NOTE — MAU Note (Signed)
Vaginal bleeding started this morning 0530, only happen once, three clots size of 50 pieces.  The vaginal discharge has been going on since last night, clear looking fluid, not sure if membrane ruptured.  Positive for fetal movement. EFM placed - FHR 170s, Toco applied - abd. Soft.

## 2017-01-16 NOTE — H&P (Signed)
[name] is a 21 y.o. female G2P1001 @ 37.6 wks presenting for  SROM sometime yesterday, unsure of the time.GHTN, GBS neg. OB History    Gravida Para Term Preterm AB Living   2 1 1     1    SAB TAB Ectopic Multiple Live Births           1     Past Medical History:  Diagnosis Date  . Anxiety   . Asthma    childhood  . Depression    post partum  . Dyspnea   . Headache   . Hypertension   . Irregular menstrual bleeding 01/31/2014  . Recurrent upper respiratory infection (URI)    Bronchitis  . Vaginal Pap smear, abnormal    HPV   Past Surgical History:  Procedure Laterality Date  . CHOLECYSTECTOMY    . CYST REMOVAL NECK    . TYMPANOSTOMY TUBE PLACEMENT    . TYMPANOSTOMY TUBE PLACEMENT     Family History: family history includes Arthritis in her mother; Bipolar disorder in her maternal grandmother; Birth defects in her father; Diabetes in her mother; Fibromyalgia in her mother; Heart disease in her maternal grandfather; Thyroid disease in her paternal grandmother. Social History:  reports that she has been smoking Cigarettes.  She has a 1.00 pack-year smoking history. She has never used smokeless tobacco. She reports that she does not drink alcohol or use drugs.     Maternal Diabetes: No Genetic Screening: Normal Maternal Ultrasounds/Referrals: Normal Fetal Ultrasounds or other Referrals:  None Maternal Substance Abuse:  No Significant Maternal Medications:  None Significant Maternal Lab Results:  None Other Comments:  None  Review of Systems  Constitutional: Negative.   HENT: Negative.   Eyes: Negative.   Respiratory: Negative.   Cardiovascular: Negative.   Gastrointestinal: Negative.   Genitourinary: Negative.   Musculoskeletal: Negative.   Skin: Negative.   Neurological: Negative.   Endo/Heme/Allergies: Negative.   Psychiatric/Behavioral: Negative.    Maternal Medical History:  Reason for admission: Rupture of membranes.   Contractions: none  Fetal  activity: Perceived fetal activity is normal.   Last perceived fetal movement was within the past hour.    Prenatal complications: PIH.   Prenatal Complications - Diabetes: none.      Blood pressure 130/83, pulse (!) 102, temperature 98.5 F (36.9 C), temperature source Oral, resp. rate 17, height 5\' 7"  (1.702 m), weight 260 lb (117.9 kg), last menstrual period 04/04/2016. Maternal Exam:  Uterine Assessment: Contraction frequency is rare.  none  Abdomen: Patient reports no abdominal tenderness. Fetal presentation: vertex  Introitus: Normal vulva. Normal vagina.  Ferning test: positive.  Nitrazine test: not done. Amniotic fluid character: clear.  Pelvis: adequate for delivery.   Cervix: Cervix evaluated by digital exam.     Fetal Exam Fetal Monitor Review: Mode: ultrasound.   Variability: moderate (6-25 bpm).   Pattern: accelerations present and no decelerations.    Fetal State Assessment: Category I - tracings are normal.     Physical Exam  Constitutional: She is oriented to person, place, and time. She appears well-developed and well-nourished.  HENT:  Head: Normocephalic.  Eyes: Pupils are equal, round, and reactive to light.  Neck: Normal range of motion.  Cardiovascular: Normal rate, regular rhythm, normal heart sounds and intact distal pulses.   Respiratory: Effort normal and breath sounds normal.  GI: Soft. Bowel sounds are normal.  Genitourinary: Vagina normal and uterus normal.  Musculoskeletal: Normal range of motion.  Neurological: She is alert  and oriented to person, place, and time. She has normal reflexes.  Skin: Skin is warm and dry.  Psychiatric: She has a normal mood and affect. Her behavior is normal. Judgment and thought content normal.    Prenatal labs: ABO, Rh: O/Negative/-- (01/16 16100956) Antibody: Negative (05/18 0902) Rubella: 4.89 (01/16 0956) RPR: Non Reactive (05/18 0902)  HBsAg: Negative (01/16 0956)  HIV:    GBS:      Assessment/Plan: Fern pos. VSS, SVE 2-3/60/-2 vertex.  Admit pit aug of labor   Wyvonnia DuskyMarie Earl Zellmer 01/16/2017, 4:58 PM

## 2017-01-17 ENCOUNTER — Encounter (HOSPITAL_COMMUNITY): Payer: Self-pay

## 2017-01-17 ENCOUNTER — Other Ambulatory Visit: Payer: Medicaid Other

## 2017-01-17 ENCOUNTER — Inpatient Hospital Stay (HOSPITAL_COMMUNITY): Payer: Medicaid Other

## 2017-01-17 ENCOUNTER — Encounter: Payer: Medicaid Other | Admitting: Obstetrics and Gynecology

## 2017-01-17 LAB — RPR: RPR Ser Ql: NONREACTIVE

## 2017-01-17 MED ORDER — OXYCODONE HCL 5 MG PO TABS
10.0000 mg | ORAL_TABLET | Freq: Once | ORAL | Status: AC
  Administered 2017-01-17: 10 mg via ORAL
  Filled 2017-01-17: qty 2

## 2017-01-17 MED ORDER — MISOPROSTOL 50MCG HALF TABLET: 50.0000 ug | ORAL_TABLET | ORAL | Status: DC | PRN

## 2017-01-17 MED ORDER — MISOPROSTOL 25 MCG QUARTER TABLET
25.0000 ug | ORAL_TABLET | ORAL | Status: DC
  Administered 2017-01-17 (×2): 25 ug via VAGINAL
  Filled 2017-01-17 (×2): qty 1

## 2017-01-17 MED ORDER — ZOLPIDEM TARTRATE 5 MG PO TABS
5.0000 mg | ORAL_TABLET | Freq: Once | ORAL | Status: AC
  Administered 2017-01-17: 5 mg via ORAL
  Filled 2017-01-17: qty 1

## 2017-01-17 NOTE — Progress Notes (Signed)
LABOR PROGRESS NOTE  [name] is a 21 y.o. G2P1001 at 1425w0d  admitted for PROM. (PROM at home on 8/4).  Subjective: Doing well. Comfortable, not feeling contractions  Objective: BP 126/64   Pulse 69   Temp 98.6 F (37 C) (Oral)   Resp 18   Ht 5\' 7"  (1.702 m)   Wt 260 lb (117.9 kg)   LMP 04/04/2016 (Exact Date)   BMI 40.72 kg/m  or  Vitals:   01/17/17 0900 01/17/17 0930 01/17/17 1001 01/17/17 1007  BP: (!) 99/43 (!) 101/56 126/64   Pulse: 73 69 69   Resp: 18 16  18   Temp:  98.1 F (36.7 C)  98.6 F (37 C)  TempSrc:  Oral  Oral  Weight:      Height:        SVE Dilation: 2.5 Effacement (%): 50 Cervical Position: Posterior Station: Ballotable Presentation: Vertex Exam by:: Dr. Nira Retortegele FHT: baseline rate 130, moderate varibility, +acel, no decel Toco: q 2-5 min, patient not feeling them  Assessment / Plan: 21 y.o. G2P1001 at 7525w0d here for IOL for PROM.   Labor: Currently on Pitocin 7018mU/min, not making cervical change, and Bishop score 4. Will stop Pitocin and place cytotec for cervical ripening Fetal Wellbeing:  Cat I Pain Control:  Per patient's request Anticipated MOD:  SVD  Frederik PearJulie P Daleen Steinhaus, MD 01/17/2017, 10:10 AM

## 2017-01-17 NOTE — Discharge Instructions (Signed)
Braxton Hicks Contractions Contractions of the uterus can occur throughout pregnancy, but they are not always a sign that you are in labor. You may have practice contractions called Braxton Hicks contractions. These false labor contractions are sometimes confused with true labor. What are Braxton Hicks contractions? Braxton Hicks contractions are tightening movements that occur in the muscles of the uterus before labor. Unlike true labor contractions, these contractions do not result in opening (dilation) and thinning of the cervix. Toward the end of pregnancy (32-34 weeks), Braxton Hicks contractions can happen more often and may become stronger. These contractions are sometimes difficult to tell apart from true labor because they can be very uncomfortable. You should not feel embarrassed if you go to the hospital with false labor. Sometimes, the only way to tell if you are in true labor is for your health care provider to look for changes in the cervix. The health care provider will do a physical exam and may monitor your contractions. If you are not in true labor, the exam should show that your cervix is not dilating and your water has not broken. If there are no prenatal problems or other health problems associated with your pregnancy, it is completely safe for you to be sent home with false labor. You may continue to have Braxton Hicks contractions until you go into true labor. How can I tell the difference between true labor and false labor?  Differences  False labor  Contractions last 30-70 seconds.: Contractions are usually shorter and not as strong as true labor contractions.  Contractions become very regular.: Contractions are usually irregular.  Discomfort is usually felt in the top of the uterus, and it spreads to the lower abdomen and low back.: Contractions are often felt in the front of the lower abdomen and in the groin.  Contractions do not go away with walking.: Contractions may  go away when you walk around or change positions while lying down.  Contractions usually become more intense and increase in frequency.: Contractions get weaker and are shorter-lasting as time goes on.  The cervix dilates and gets thinner.: The cervix usually does not dilate or become thin. Follow these instructions at home:  Take over-the-counter and prescription medicines only as told by your health care provider.  Keep up with your usual exercises and follow other instructions from your health care provider.  Eat and drink lightly if you think you are going into labor.  If Braxton Hicks contractions are making you uncomfortable:  Change your position from lying down or resting to walking, or change from walking to resting.  Sit and rest in a tub of warm water.  Drink enough fluid to keep your urine clear or pale yellow. Dehydration may cause these contractions.  Do slow and deep breathing several times an hour.  Keep all follow-up prenatal visits as told by your health care provider. This is important. Contact a health care provider if:  You have a fever.  You have continuous pain in your abdomen. Get help right away if:  Your contractions become stronger, more regular, and closer together.  You have fluid leaking or gushing from your vagina.  You pass blood-tinged mucus (bloody show).  You have bleeding from your vagina.  You have low back pain that you never had before.  You feel your baby's head pushing down and causing pelvic pressure.  Your baby is not moving inside you as much as it used to. Summary  Contractions that occur before labor are   called Braxton Hicks contractions, false labor, or practice contractions.  Braxton Hicks contractions are usually shorter, weaker, farther apart, and less regular than true labor contractions. True labor contractions usually become progressively stronger and regular and they become more frequent.  Manage discomfort from  Braxton Hicks contractions by changing position, resting in a warm bath, drinking plenty of water, or practicing deep breathing. This information is not intended to replace advice given to you by your health care provider. Make sure you discuss any questions you have with your health care provider. Document Released: 05/31/2005 Document Revised: 04/19/2016 Document Reviewed: 04/19/2016 Elsevier Interactive Patient Education  2017 [employer] Third Trimester of Pregnancy The third trimester is from week 28 through week 40 (months 7 through 9). The third trimester is a time when the unborn baby (fetus) is growing rapidly. At the end of the ninth month, the fetus is about 20 inches in length and weighs 6-10 pounds. Body changes during your third trimester Your body will continue to go through many changes during pregnancy. The changes vary from woman to woman. During the third trimester:  Your weight will continue to increase. You can expect to gain 25-35 pounds (11-16 kg) by the end of the pregnancy.  You may begin to get stretch marks on your hips, abdomen, and breasts.  You may urinate more often because the fetus is moving lower into your pelvis and pressing on your bladder.  You may develop or continue to have heartburn. This is caused by increased hormones that slow down muscles in the digestive tract.  You may develop or continue to have constipation because increased hormones slow digestion and cause the muscles that push waste through your intestines to relax.  You may develop hemorrhoids. These are swollen veins (varicose veins) in the rectum that can itch or be painful.  You may develop swollen, bulging veins (varicose veins) in your legs.  You may have increased body aches in the pelvis, back, or thighs. This is due to weight gain and increased hormones that are relaxing your joints.  You may have changes in your hair. These can include thickening of your hair, rapid growth, and  changes in texture. Some women also have hair loss during or after pregnancy, or hair that feels dry or thin. Your hair will most likely return to normal after your baby is born.  Your breasts will continue to grow and they will continue to become tender. A yellow fluid (colostrum) may leak from your breasts. This is the first milk you are producing for your baby.  Your belly button may stick out.  You may notice more swelling in your hands, face, or ankles.  You may have increased tingling or numbness in your hands, arms, and legs. The skin on your belly may also feel numb.  You may feel short of breath because of your expanding uterus.  You may have more problems sleeping. This can be caused by the size of your belly, increased need to urinate, and an increase in your body's metabolism.  You may notice the fetus "dropping," or moving lower in your abdomen (lightening).  You may have increased vaginal discharge.  You may notice your joints feel loose and you may have pain around your pelvic bone. What to expect at prenatal visits You will have prenatal exams every 2 weeks until week 36. Then you will have weekly prenatal exams. During a routine prenatal visit:  You will be weighed to make sure you and the baby   are growing normally.  Your blood pressure will be taken.  Your abdomen will be measured to track your baby's growth.  The fetal heartbeat will be listened to.  Any test results from the previous visit will be discussed.  You may have a cervical check near your due date to see if your cervix has softened or thinned (effaced).  You will be tested for Group B streptococcus. This happens between 35 and 37 weeks. Your health care provider may ask you:  What your birth plan is.  How you are feeling.  If you are feeling the baby move.  If you have had any abnormal symptoms, such as leaking fluid, bleeding, severe headaches, or abdominal cramping.  If you are using any  tobacco products, including cigarettes, chewing tobacco, and electronic cigarettes.  If you have any questions. Other tests or screenings that may be performed during your third trimester include:  Blood tests that check for low iron levels (anemia).  Fetal testing to check the health, activity level, and growth of the fetus. Testing is done if you have certain medical conditions or if there are problems during the pregnancy.  Nonstress test (NST). This test checks the health of your baby to make sure there are no signs of problems, such as the baby not getting enough oxygen. During this test, a belt is placed around your belly. The baby is made to move, and its heart rate is monitored during movement. What is false labor? False labor is a condition in which you feel small, irregular tightenings of the muscles in the womb (contractions) that usually go away with rest, changing position, or drinking water. These are called Braxton Hicks contractions. Contractions may last for hours, days, or even weeks before true labor sets in. If contractions come at regular intervals, become more frequent, increase in intensity, or become painful, you should see your health care provider. What are the signs of labor?  Abdominal cramps.  Regular contractions that start at 10 minutes apart and become stronger and more frequent with time.  Contractions that start on the top of the uterus and spread down to the lower abdomen and back.  Increased pelvic pressure and dull back pain.  A watery or bloody mucus discharge that comes from the vagina.  Leaking of amniotic fluid. This is also known as your "water breaking." It could be a slow trickle or a gush. Let your health care provider know if it has a color or strange odor. If you have any of these signs, call your health care provider right away, even if it is before your due date. Follow these instructions at home: Medicines   Follow your health care  provider's instructions regarding medicine use. Specific medicines may be either safe or unsafe to take during pregnancy.  Take a prenatal vitamin that contains at least 600 micrograms (mcg) of folic acid.  If you develop constipation, try taking a stool softener if your health care provider approves. Eating and drinking   Eat a balanced diet that includes fresh fruits and vegetables, whole grains, good sources of protein such as meat, eggs, or tofu, and low-fat dairy. Your health care provider will help you determine the amount of weight gain that is right for you.  Avoid raw meat and uncooked cheese. These carry germs that can cause birth defects in the baby.  If you have low calcium intake from food, talk to your health care provider about whether you should take a daily calcium supplement.    Eat four or five small meals rather than three large meals a day.  Limit foods that are high in fat and processed sugars, such as fried and sweet foods.  To prevent constipation:  Drink enough fluid to keep your urine clear or pale yellow.  Eat foods that are high in fiber, such as fresh fruits and vegetables, whole grains, and beans. Activity   Exercise only as directed by your health care provider. Most women can continue their usual exercise routine during pregnancy. Try to exercise for 30 minutes at least 5 days a week. Stop exercising if you experience uterine contractions.  Avoid heavy lifting.  Do not exercise in extreme heat or humidity, or at high altitudes.  Wear low-heel, comfortable shoes.  Practice good posture.  You may continue to have sex unless your health care provider tells you otherwise. Relieving pain and discomfort   Take frequent breaks and rest with your legs elevated if you have leg cramps or low back pain.  Take warm sitz baths to soothe any pain or discomfort caused by hemorrhoids. Use hemorrhoid cream if your health care provider approves.  Wear a good  support bra to prevent discomfort from breast tenderness.  If you develop varicose veins:  Wear support pantyhose or compression stockings as told by your healthcare provider.  Elevate your feet for 15 minutes, 3-4 times a day. Prenatal care   Write down your questions. Take them to your prenatal visits.  Keep all your prenatal visits as told by your health care provider. This is important. Safety   Wear your seat belt at all times when driving.  Make a list of emergency phone numbers, including numbers for family, friends, the hospital, and police and fire departments. General instructions   Avoid cat litter boxes and soil used by cats. These carry germs that can cause birth defects in the baby. If you have a cat, ask someone to clean the litter box for you.  Do not travel far distances unless it is absolutely necessary and only with the approval of your health care provider.  Do not use hot tubs, steam rooms, or saunas.  Do not drink alcohol.  Do not use any products that contain nicotine or tobacco, such as cigarettes and e-cigarettes. If you need help quitting, ask your health care provider.  Do not use any medicinal herbs or unprescribed drugs. These chemicals affect the formation and growth of the baby.  Do not douche or use tampons or scented sanitary pads.  Do not cross your legs for long periods of time.  To prepare for the arrival of your baby:  Take prenatal classes to understand, practice, and ask questions about labor and delivery.  Make a trial run to the hospital.  Visit the hospital and tour the maternity area.  Arrange for maternity or paternity leave through employers.  Arrange for family and friends to take care of pets while you are in the hospital.  Purchase a rear-facing car seat and make sure you know how to install it in your car.  Pack your hospital bag.  Prepare the baby's nursery. Make sure to remove all pillows and stuffed animals from  the baby's crib to prevent suffocation.  Visit your dentist if you have not gone during your pregnancy. Use a soft toothbrush to brush your teeth and be gentle when you floss. Contact a health care provider if:  You are unsure if you are in labor or if your water has broken.  You   become dizzy.  You have mild pelvic cramps, pelvic pressure, or nagging pain in your abdominal area.  You have lower back pain.  You have persistent nausea, vomiting, or diarrhea.  You have an unusual or bad smelling vaginal discharge.  You have pain when you urinate. Get help right away if:  Your water breaks before 37 weeks.  You have regular contractions less than 5 minutes apart before 37 weeks.  You have a fever.  You are leaking fluid from your vagina.  You have spotting or bleeding from your vagina.  You have severe abdominal pain or cramping.  You have rapid weight loss or weight gain.  You have shortness of breath with chest pain.  You notice sudden or extreme swelling of your face, hands, ankles, feet, or legs.  Your baby makes fewer than 10 movements in 2 hours.  You have severe headaches that do not go away when you take medicine.  You have vision changes. Summary  The third trimester is from week 28 through week 40, months 7 through 9. The third trimester is a time when the unborn baby (fetus) is growing rapidly.  During the third trimester, your discomfort may increase as you and your baby continue to gain weight. You may have abdominal, leg, and back pain, sleeping problems, and an increased need to urinate.  During the third trimester your breasts will keep growing and they will continue to become tender. A yellow fluid (colostrum) may leak from your breasts. This is the first milk you are producing for your baby.  False labor is a condition in which you feel small, irregular tightenings of the muscles in the womb (contractions) that eventually go away. These are called  Braxton Hicks contractions. Contractions may last for hours, days, or even weeks before true labor sets in.  Signs of labor can include: abdominal cramps; regular contractions that start at 10 minutes apart and become stronger and more frequent with time; watery or bloody mucus discharge that comes from the vagina; increased pelvic pressure and dull back pain; and leaking of amniotic fluid. This information is not intended to replace advice given to you by your health care provider. Make sure you discuss any questions you have with your health care provider. Document Released: 05/25/2001 Document Revised: 11/06/2015 Document Reviewed: 08/01/2012 Elsevier Interactive Patient Education  2017 [employer]  

## 2017-01-17 NOTE — Progress Notes (Signed)
LABOR PROGRESS NOTE  [name] is a 21 y.o. G2P1001 at 496w0d  admitted for PROM. (PROM at home on 8/4).  Subjective: Reprots HA. Denies any other concerns.  Objective: BP 129/69   Pulse 80   Temp 98.4 F (36.9 C) (Oral)   Resp 20   Ht 5\' 7"  (1.702 m)   Wt 260 lb (117.9 kg)   LMP 04/04/2016 (Exact Date)   BMI 40.72 kg/m  or  Vitals:   01/17/17 1500 01/17/17 1530 01/17/17 1600 01/17/17 1601  BP: 133/82 (!) 110/54  129/69  Pulse: 80 78  80  Resp: 18  20   Temp:   98.4 F (36.9 C)   TempSrc:   Oral   Weight:      Height:        SVE Dilation: 3 Effacement (%): 50 Cervical Position: Middle Station: -3 Presentation: Vertex Exam by:: Lorretta Harp. Brown RNC FHT: baseline rate 120, moderate varibility, +acel, no decel Toco: irritability   Assessment / Plan: 21 y.o. G2P1001 at 486w0d here for IOL for PROM.   Labor: s/p cytotec #2 at 1430. Continue cervical ripening Fetal Wellbeing:  Cat I Pain Control:  Per patient's request Anticipated MOD:  SVD  Frederik PearJulie P Lulabelle Desta, MD 01/17/2017, 4:33 PM

## 2017-01-17 NOTE — Progress Notes (Signed)
Patient discharged home to self-care. Discharge instructions reviewed with patient, no questions at this time.  VS WNL, IV d/c and patient discharged with family home. [name], [name]

## 2017-01-17 NOTE — Progress Notes (Signed)
[name] is a 21 y.o. G2P1001 at 1059w0d by LMP admitted for suspected PROM  Subjective:  Patient has not had any leaking throughout the day. She is continuing to express frustration at no labor progress.  Objective: BP 127/82   Pulse 77   Temp 97.8 F (36.6 C) (Oral)   Resp 16   Ht 5\' 7"  (1.702 m)   Wt 260 lb (117.9 kg)   LMP 04/04/2016 (Exact Date)   BMI 40.72 kg/m  No intake/output data recorded. No intake/output data recorded.  FHT:  FHR: 150 bpm, variability: moderate,  accelerations:  Present,  decelerations:  Absent UC:   none SVE:   Dilation: 3 Effacement (%): 50 Station: Ballotable Exam by:: [name],cnm   No overt signs of ROM when performing SVE.   Labs: Lab Results  Component Value Date   WBC 12.6 (H) 01/16/2017   HGB 11.6 (L) 01/16/2017   HCT 33.5 (L) 01/16/2017   MCV 87.5 01/16/2017   PLT 210 01/16/2017    Assessment / Plan: prolonged latent phase. Unable to achieve active labor with pitocin and cytotec  Labor: latent phase  Preeclampsia:  no signs or symptoms of toxicity Fetal Wellbeing:  Category I Pain Control:  Labor support without medications I/D:  n/a Anticipated MOD:  NSVD   D/W patient that if no progress tonight we will check AFI in the morning, and consider DC home. Patient states that if we might send her home in the morning she would like to go home tonight.   D/W Dr. Elvera FullingHarraway-Smith, will check AFI if WNL then we can DC patient home. Patient has not had any leaking today.  Danielle ShellerHeather Laurence [name] 01/17/2017, 9:04 PM

## 2017-01-17 NOTE — Discharge Summary (Signed)
OB Discharge Summary     Patient Name: [name] DOB: 07-15-1995 MRN: [id number]  Date of admission: 01/16/2017 Delivering MD:     Date of discharge: 01/17/2017  Admitting diagnosis: 36 WKS, BLOOD IN URINE Intrauterine pregnancy: [redacted]w[redacted]d     Secondary diagnosis:  Active Problems:   Labor and delivery, indication for care   Normal labor  Additional problems: Patient was admitted to labor and delivery with presumed ROM (+Fern). Despite efforts to induce labor patient was not able to achieve labor. Consultation with Harraway-Smith, and the patient the decision was made to perform an [place] for AFI. If AFI was found to be normal then we could DC patient home. AFI was normal. Patient was not having any visible leaking of fluid. FHR tracing was reactive while here.      Discharge diagnosis: Suspected ROM, with ROM not found.                                                                                                 Post partum procedures:NA  Augmentation: Pitocin, Cytotec and attempted labor induction with these methods, and then failed.   Complications: None  Hospital course:  Labor did not start. Patient was DC home.   Physical exam  Vitals:   01/17/17 1657 01/17/17 1701 01/17/17 1801 01/17/17 1944  BP:  113/60 107/61 127/82  Pulse:  78 75 77  Resp: 18  16   Temp:   99 F (37.2 C) 97.8 F (36.6 C)  TempSrc:   Oral Oral  Weight:      Height:       General: alert, cooperative and no distress Lochia: NA Uterine Fundus: AGD Incision: NA DVT Evaluation: No evidence of DVT seen on physical exam. Labs: Lab Results  Component Value Date   WBC 12.6 (H) 01/16/2017   HGB 11.6 (L) 01/16/2017   HCT 33.5 (L) 01/16/2017   MCV 87.5 01/16/2017   PLT 210 01/16/2017   CMP Latest Ref Rng & Units 01/01/2017  Glucose 65 - 99 mg/dL 130(Q)  BUN 6 - 20 mg/dL 6  Creatinine 6.57 - 8.46 mg/dL 9.62  Sodium 952 - 841 mmol/L 136  Potassium 3.5 - 5.1 mmol/L 3.7  Chloride 101 - 111 mmol/L  106  CO2 22 - 32 mmol/L 23  Calcium 8.9 - 10.3 mg/dL 9.1  Total Protein 6.5 - 8.1 g/dL 6.8  Total Bilirubin 0.3 - 1.2 mg/dL 0.3  Alkaline Phos 38 - 126 U/L 147(H)  AST 15 - 41 U/L 14(L)  ALT 14 - 54 U/L 10(L)    Discharge instruction: per After Visit Summary and "Baby and Me Booklet".  After visit meds:  Allergies as of 01/17/2017   No Known Allergies     Medication List    TAKE these medications   escitalopram 10 MG tablet Commonly known as:  LEXAPRO Take 1 tablet (10 mg total) by mouth daily.   Evening Primrose Oil 1000 MG Caps Take 2,000 mg by mouth at bedtime.   omeprazole 20 MG capsule Commonly known as:  PRILOSEC Take 1 capsule (20 mg total)  by mouth daily. What changed:  when to take this   ranitidine 300 MG tablet Commonly known as:  ZANTAC Take 300 mg by mouth at bedtime.       Diet: routine diet  Activity: As tolerated   Outpatient follow up: routine OB care  Follow up Appt:No future appointments. Follow up Visit:No Follow-up on file.  Postpartum contraception: NA  Newborn Data: NA  Baby Feeding: NA Disposition:NA   01/17/2017 Thressa ShellerHeather Hogan, CNM

## 2017-01-17 NOTE — Progress Notes (Signed)
[name] is a 21 y.o. G2P1001 at 7549w0d by LMP admitted for PROM  Subjective:  Patient reports contractions are not painful. Only feeling some minimal cramping. Expressing frustration at slow progress.   Objective: BP 130/80   Pulse 83   Temp 98.9 F (37.2 C) (Oral)   Resp 18   Ht 5\' 7"  (1.702 m)   Wt 260 lb (117.9 kg)   LMP 04/04/2016 (Exact Date)   BMI 40.72 kg/m  No intake/output data recorded. No intake/output data recorded.  FHT:  FHR: 120 bpm, variability: moderate,  accelerations:  Present,  decelerations:  Absent UC:   irregular, every 2-3 minutes SVE:   Dilation: 2.5 Effacement (%): 50 Station: -3, Ballotable Exam by:: Ryland [employer] RN  Labs: Lab Results  Component Value Date   WBC 12.6 (H) 01/16/2017   HGB 11.6 (L) 01/16/2017   HCT 33.5 (L) 01/16/2017   MCV 87.5 01/16/2017   PLT 210 01/16/2017    Assessment / Plan: Induction of labor due to term PROM. Active labor not achieved at this time.  Labor: Latent phase Preeclampsia:  NA Fetal Wellbeing:  Category I Pain Control:  Labor support without medications I/D:  n/a Anticipated MOD:  NSVD  Thressa ShellerHeather Hogan 01/17/2017, 7:28 AM

## 2017-01-18 ENCOUNTER — Encounter: Payer: Self-pay | Admitting: Obstetrics and Gynecology

## 2017-01-19 ENCOUNTER — Inpatient Hospital Stay (HOSPITAL_COMMUNITY)
Admission: AD | Admit: 2017-01-19 | Discharge: 2017-01-19 | Disposition: A | Discharge status: Home / Self Care | Payer: Medicaid Other | Source: Ambulatory Visit | Attending: Obstetrics and Gynecology | Admitting: Obstetrics and Gynecology

## 2017-01-19 ENCOUNTER — Encounter (HOSPITAL_COMMUNITY): Payer: Self-pay | Admitting: *Deleted

## 2017-01-19 ENCOUNTER — Telehealth: Payer: Self-pay | Admitting: Obstetrics & Gynecology

## 2017-01-19 DIAGNOSIS — O163 Unspecified maternal hypertension, third trimester: Secondary | ICD-10-CM | POA: Diagnosis not present

## 2017-01-19 DIAGNOSIS — O23593 Infection of other part of genital tract in pregnancy, third trimester: Secondary | ICD-10-CM | POA: Insufficient documentation

## 2017-01-19 DIAGNOSIS — Z833 Family history of diabetes mellitus: Secondary | ICD-10-CM | POA: Insufficient documentation

## 2017-01-19 DIAGNOSIS — O9989 Other specified diseases and conditions complicating pregnancy, childbirth and the puerperium: Secondary | ICD-10-CM | POA: Diagnosis not present

## 2017-01-19 DIAGNOSIS — N898 Other specified noninflammatory disorders of vagina: Secondary | ICD-10-CM

## 2017-01-19 DIAGNOSIS — Z8261 Family history of arthritis: Secondary | ICD-10-CM | POA: Diagnosis not present

## 2017-01-19 DIAGNOSIS — B9689 Other specified bacterial agents as the cause of diseases classified elsewhere: Secondary | ICD-10-CM | POA: Diagnosis not present

## 2017-01-19 DIAGNOSIS — Z8249 Family history of ischemic heart disease and other diseases of the circulatory system: Secondary | ICD-10-CM | POA: Insufficient documentation

## 2017-01-19 DIAGNOSIS — N760 Acute vaginitis: Secondary | ICD-10-CM | POA: Insufficient documentation

## 2017-01-19 DIAGNOSIS — Z3A38 38 weeks gestation of pregnancy: Secondary | ICD-10-CM | POA: Insufficient documentation

## 2017-01-19 DIAGNOSIS — O99333 Smoking (tobacco) complicating pregnancy, third trimester: Secondary | ICD-10-CM | POA: Insufficient documentation

## 2017-01-19 DIAGNOSIS — F17210 Nicotine dependence, cigarettes, uncomplicated: Secondary | ICD-10-CM | POA: Diagnosis not present

## 2017-01-19 LAB — WET PREP, GENITAL
Sperm: NONE SEEN
TRICH WET PREP: NONE SEEN
Yeast Wet Prep HPF POC: NONE SEEN

## 2017-01-19 LAB — AMNISURE RUPTURE OF MEMBRANE (ROM) NOT AT ARMC: AMNISURE: NEGATIVE

## 2017-01-19 MED ORDER — METRONIDAZOLE 500 MG PO TABS: 500.0000 mg | ORAL_TABLET | Freq: Two times a day (BID) | ORAL | 0 refills | Status: AC

## 2017-01-19 NOTE — MAU Note (Signed)
Pt presents with complaint of leaking clear fluid and having some contractions

## 2017-01-19 NOTE — MAU Provider Note (Signed)
Chief Complaint:  Leaking of Fluid   None     HPI: [name] is a 21 y.o. G2P1001 at 8580w2d who presents to MAU reporting leaking of fluid. She endorses feeling fluid come out every time she moves. She denies any big gushes of fluid. The fluid appears clear and mixed with mucous. Sometimes has odor. Denies any vaginal discharge. Feeling of leaking started this morning. Has been having som associated irregular contractions. Denies vaginal bleeding. Good fetal movement.   Of note, patient states that she was admitted to L&D on 01/16/17 for presumed ROM with a positive fern. She was set up for IOL. However a follow-up US showed that patient had normal AFI levels. She was then discharged home.   Pregnancy Course:  [facility]  Past Medical History: Past Medical History:  Diagnosis Date  . Anxiety   . Asthma    childhood  . Depression    post partum  . Dyspnea   . Headache   . Hypertension   . Irregular menstrual bleeding 01/31/2014  . Recurrent upper respiratory infection (URI)    Bronchitis  . Vaginal Pap smear, abnormal    HPV    Past obstetric history: OB History  Gravida Para Term Preterm AB Living  2 1 1     1   SAB TAB Ectopic Multiple Live Births          1    # Outcome Date GA Lbr Len/2nd Weight Sex Delivery Anes PTL Lv  2 Current           1 Term 06/13/13 259w6d 26:18 / 06:10 8 lb 0.2 oz (3.635 kg) M Vag-Spont EPI N LIV     Birth Comments: caput      Past Surgical History: Past Surgical History:  Procedure Laterality Date  . CHOLECYSTECTOMY    . CYST REMOVAL NECK    . TYMPANOSTOMY TUBE PLACEMENT    . TYMPANOSTOMY TUBE PLACEMENT       Family History: Family History  Problem Relation Age of Onset  . Bipolar disorder Maternal Grandmother   . Heart disease Maternal Grandfather   . Thyroid disease Paternal Grandmother   . Arthritis Mother   . Fibromyalgia Mother   . Diabetes Mother   . Birth defects Father     Social  History: Social History  Substance Use Topics  . Smoking status: Current Every Day Smoker    Packs/day: 0.25    Years: 4.00    Types: Cigarettes  . Smokeless tobacco: Never Used     Comment: trying to quit-educated on smoking during pregnancy  . Alcohol use No     Comment: socially    Allergies: No Known Allergies  Meds:  Prescriptions Prior to Admission  Medication Sig Dispense Refill Last Dose  . escitalopram (LEXAPRO) 10 MG tablet Take 1 tablet (10 mg total) by mouth daily. 30 tablet 6 Past Month at Unknown time  . Evening Primrose Oil 1000 MG CAPS Take 2,000 mg by mouth at bedtime.   01/16/2017 at Unknown time  . omeprazole (PRILOSEC) 20 MG capsule Take 1 capsule (20 mg total) by mouth daily. (Patient taking differently: Take 20 mg by mouth 2 (two) times daily. ) 30 capsule 6 Past Week at Unknown time  . ranitidine (ZANTAC) 300 MG tablet Take 300 mg by mouth at bedtime.    01/16/2017 at Unknown time    I have reviewed patient's Past Medical Hx, Surgical Hx, Family Hx, Social  Hx, medications and allergies.   ROS:  All systems reviewed and are negative for acute change except as noted in the HPI.   Physical Exam  Patient Vitals for the past 24 hrs:  BP Temp Temp src Pulse Resp  01/19/17 1702 133/84 98.4 F (36.9 C) Oral (!) 122 18   Constitutional: Well-developed, well-nourished female in no acute distress.  Cardiovascular: normal rate Respiratory: normal effort GI: Abd soft, non-tender, gravid appropriate for gestational age. MS: Extremities nontender, no edema, normal ROM Neurologic: Alert and oriented x 4. No focal deficits. Sterile speculum exam: no pooling, +discharge, cervix clean, NEFG  Dilation: 1.5 Exam by:: Tyrene Nader,MD   Labs: Results for orders placed or performed during the hospital encounter of 01/19/17 (from the past 24 hour(s))  Amnisure rupture of membrane (rom)not at [facility]     Status: None   Collection Time: 01/19/17  6:18 PM  Result Value Ref Range    Amnisure ROM NEGATIVE   Wet prep, genital     Status: Abnormal   Collection Time: 01/19/17  6:18 PM  Result Value Ref Range   Yeast Wet Prep HPF POC NONE SEEN NONE SEEN   Trich, Wet Prep NONE SEEN NONE SEEN   Clue Cells Wet Prep HPF POC PRESENT (A) NONE SEEN   WBC, Wet Prep HPF POC MODERATE (A) NONE SEEN   Sperm NONE SEEN     Imaging:  N/A  MAU Course: Fern Negative No signs of ROM  I personally reviewed the patient's NST today, found to be REACTIVE.   MDM: Plan of care reviewed with patient, including labs and tests ordered and medical treatment.   Assessment: 1. Vaginal discharge   2. Bacterial vaginosis   Rule out ROM  Plan: Will treat with course of Flagyl since symtpomatic Discharge home in stable condition.  Labor precautions and fetal kick counts Handout given Follow-up with OB provider   Caryl Ada, DO OB Fellow Center for [facility], [facility] 01/19/2017 7:07 PM

## 2017-01-19 NOTE — Telephone Encounter (Signed)
Patient called stating that she would like a call back from the nurse, Patient did not state the reason why. Please contact pt

## 2017-01-19 NOTE — Discharge Instructions (Signed)

## 2017-01-19 NOTE — Telephone Encounter (Signed)
Pt called stating that she had been admitted to the hospital on Sunday for leaking of fluid with + ferning. She states that induction was started but after 2 days she was sent home. She states that she is 100% certain that her water is leaking. She states that her panties are constantly wet. She states that when she sat on the toilet and pushed, fluid came out. She states when she laughs or coughs fluid comes out.While on the phone she stated that fluid just ran down her leg.  She states that she is having contractions but not regular. No c/o bleeding. She states that baby is moving. [facility]. Pt verbalized understanding.

## 2017-01-20 ENCOUNTER — Encounter: Payer: Self-pay | Admitting: Obstetrics & Gynecology

## 2017-01-20 ENCOUNTER — Ambulatory Visit (INDEPENDENT_AMBULATORY_CARE_PROVIDER_SITE_OTHER): Payer: Medicaid Other | Admitting: Obstetrics & Gynecology

## 2017-01-20 ENCOUNTER — Other Ambulatory Visit: Payer: Medicaid Other | Admitting: Obstetrics & Gynecology

## 2017-01-20 VITALS — BP 142/70 | HR 111 | Wt 260.0 lb

## 2017-01-20 DIAGNOSIS — Z331 Pregnant state, incidental: Secondary | ICD-10-CM

## 2017-01-20 DIAGNOSIS — O09893 Supervision of other high risk pregnancies, third trimester: Secondary | ICD-10-CM

## 2017-01-20 DIAGNOSIS — Z3A38 38 weeks gestation of pregnancy: Secondary | ICD-10-CM

## 2017-01-20 DIAGNOSIS — Z1389 Encounter for screening for other disorder: Secondary | ICD-10-CM

## 2017-01-20 DIAGNOSIS — O0993 Supervision of high risk pregnancy, unspecified, third trimester: Secondary | ICD-10-CM

## 2017-01-20 DIAGNOSIS — I10 Essential (primary) hypertension: Secondary | ICD-10-CM

## 2017-01-20 DIAGNOSIS — O133 Gestational [pregnancy-induced] hypertension without significant proteinuria, third trimester: Secondary | ICD-10-CM

## 2017-01-20 LAB — POCT URINALYSIS DIPSTICK
Blood, UA: NEGATIVE
Glucose, UA: NEGATIVE
Ketones, UA: NEGATIVE
Nitrite, UA: NEGATIVE
PROTEIN UA: NEGATIVE

## 2017-01-20 MED ORDER — ACYCLOVIR 400 MG PO TABS: 400.0000 mg | ORAL_TABLET | Freq: Three times a day (TID) | ORAL | 2 refills | Status: DC

## 2017-01-20 NOTE — Progress Notes (Signed)
Fetal Surveillance Testing today:  Reactive NST   High Risk Pregnancy Diagnosis(es):   CHTN no meds  G2P1001 1665w3d Estimated Date of Delivery: 01/31/17  Blood pressure (!) 142/70, pulse (!) 111, weight 260 lb (117.9 kg), last menstrual period 04/04/2016.  Urinalysis: Negative   HPI: The patient is being seen today for ongoing management of as above. Today she reports no LOF   BP weight and urine results all reviewed and noted. Patient reports good fetal movement, denies any bleeding and no rupture of membranes symptoms or regular contractions.  Fundal Height:  38 Fetal Heart rate:  140 Edema:  none  Patient is without complaints other than noted in her HPI. All questions were answered.  All lab and sonogram results have been reviewed. Comments:    Assessment:  1.  Pregnancy at 4565w3d,  Estimated Date of Delivery: 01/31/17 :                          2.  CHTN                        3.    Medication(s) Plans:    Treatment Plan:  Twice weekly testing deliver 40 weeks or as indicated, no meds CHTN  Return in about 4 days (around 01/24/2017) for NST, HROB with Dr Emelda FearFerguson. for appointment for high risk OB care  No orders of the defined types were placed in this encounter.  Orders Placed This Encounter  Procedures  . POCT Urinalysis Dipstick

## 2017-01-20 NOTE — Addendum Note (Signed)
Encounter addended by: Armando ReichertHogan, Makalya Nave D, CNM on: 01/20/2017  9:50 AM<BR>    Actions taken: Letter status changed

## 2017-01-20 NOTE — Addendum Note (Signed)
Addended by: Lazaro ArmsEURE, Damita Eppard H on: 01/20/2017 03:26 PM   Modules accepted: Orders

## 2017-01-24 ENCOUNTER — Ambulatory Visit (INDEPENDENT_AMBULATORY_CARE_PROVIDER_SITE_OTHER): Payer: Medicaid Other | Admitting: Obstetrics and Gynecology

## 2017-01-24 ENCOUNTER — Encounter (HOSPITAL_COMMUNITY): Payer: Self-pay | Admitting: Anesthesiology

## 2017-01-24 ENCOUNTER — Encounter: Payer: Self-pay | Admitting: Obstetrics and Gynecology

## 2017-01-24 ENCOUNTER — Other Ambulatory Visit: Payer: Self-pay | Admitting: Obstetrics and Gynecology

## 2017-01-24 ENCOUNTER — Encounter (HOSPITAL_COMMUNITY): Payer: Self-pay | Admitting: *Deleted

## 2017-01-24 ENCOUNTER — Inpatient Hospital Stay (HOSPITAL_COMMUNITY)
Admission: AD | Admit: 2017-01-24 | Discharge: 2017-01-26 | DRG: 774 | Disposition: A | Discharge status: Home / Self Care | Payer: Medicaid Other | Source: Ambulatory Visit | Attending: Family Medicine | Admitting: Family Medicine

## 2017-01-24 VITALS — BP 140/80 | HR 80 | Wt 257.6 lb

## 2017-01-24 DIAGNOSIS — O1002 Pre-existing essential hypertension complicating childbirth: Principal | ICD-10-CM | POA: Diagnosis present

## 2017-01-24 DIAGNOSIS — Z6791 Unspecified blood type, Rh negative: Secondary | ICD-10-CM

## 2017-01-24 DIAGNOSIS — F17210 Nicotine dependence, cigarettes, uncomplicated: Secondary | ICD-10-CM | POA: Diagnosis present

## 2017-01-24 DIAGNOSIS — Z331 Pregnant state, incidental: Secondary | ICD-10-CM

## 2017-01-24 DIAGNOSIS — O99334 Smoking (tobacco) complicating childbirth: Secondary | ICD-10-CM | POA: Diagnosis present

## 2017-01-24 DIAGNOSIS — O26893 Other specified pregnancy related conditions, third trimester: Secondary | ICD-10-CM | POA: Diagnosis present

## 2017-01-24 DIAGNOSIS — Z3A39 39 weeks gestation of pregnancy: Secondary | ICD-10-CM

## 2017-01-24 DIAGNOSIS — A749 Chlamydial infection, unspecified: Secondary | ICD-10-CM

## 2017-01-24 DIAGNOSIS — Z1389 Encounter for screening for other disorder: Secondary | ICD-10-CM

## 2017-01-24 DIAGNOSIS — O133 Gestational [pregnancy-induced] hypertension without significant proteinuria, third trimester: Secondary | ICD-10-CM

## 2017-01-24 DIAGNOSIS — Z6841 Body Mass Index (BMI) 40.0 and over, adult: Secondary | ICD-10-CM | POA: Diagnosis not present

## 2017-01-24 DIAGNOSIS — O99214 Obesity complicating childbirth: Secondary | ICD-10-CM | POA: Diagnosis present

## 2017-01-24 DIAGNOSIS — R030 Elevated blood-pressure reading, without diagnosis of hypertension: Secondary | ICD-10-CM

## 2017-01-24 DIAGNOSIS — Z3483 Encounter for supervision of other normal pregnancy, third trimester: Secondary | ICD-10-CM

## 2017-01-24 DIAGNOSIS — O10919 Unspecified pre-existing hypertension complicating pregnancy, unspecified trimester: Secondary | ICD-10-CM | POA: Diagnosis present

## 2017-01-24 LAB — PROTEIN / CREATININE RATIO, URINE
Creatinine, Urine: 185 mg/dL
PROTEIN CREATININE RATIO: 0.14 mg/mg{creat} (ref 0.00–0.15)
TOTAL PROTEIN, URINE: 26 mg/dL

## 2017-01-24 LAB — COMPREHENSIVE METABOLIC PANEL
ALBUMIN: 3 g/dL — AB (ref 3.5–5.0)
ALT: 10 U/L — AB (ref 14–54)
AST: 41 U/L (ref 15–41)
Alkaline Phosphatase: 177 U/L — ABNORMAL HIGH (ref 38–126)
Anion gap: 13 (ref 5–15)
BUN: 6 mg/dL (ref 6–20)
CHLORIDE: 105 mmol/L (ref 101–111)
CO2: 15 mmol/L — AB (ref 22–32)
Calcium: 8.8 mg/dL — ABNORMAL LOW (ref 8.9–10.3)
Creatinine, Ser: 0.66 mg/dL (ref 0.44–1.00)
GFR calc non Af Amer: >60 mL/min (ref >60)
Glucose, Bld: 166 mg/dL — ABNORMAL HIGH (ref 65–99)
Potassium: 4.5 mmol/L (ref 3.5–5.1)
SODIUM: 133 mmol/L — AB (ref 135–145)
Total Bilirubin: 0.9 mg/dL (ref 0.3–1.2)
Total Protein: 6.7 g/dL (ref 6.5–8.1)

## 2017-01-24 LAB — POCT URINALYSIS DIPSTICK
Blood, UA: NEGATIVE
Glucose, UA: 1
KETONES UA: NEGATIVE
LEUKOCYTES UA: NEGATIVE
NITRITE UA: NEGATIVE
PROTEIN UA: NEGATIVE

## 2017-01-24 LAB — CBC
HCT: 35.5 % — ABNORMAL LOW (ref 36.0–46.0)
Hemoglobin: 12.5 g/dL (ref 12.0–15.0)
MCH: 30.9 pg (ref 26.0–34.0)
MCHC: 35.2 g/dL (ref 30.0–36.0)
MCV: 87.7 fL (ref 78.0–100.0)
PLATELETS: 230 10*3/uL (ref 150–400)
RBC: 4.05 MIL/uL (ref 3.87–5.11)
RDW: 14.1 % (ref 11.5–15.5)
WBC: 14.1 10*3/uL — ABNORMAL HIGH (ref 4.0–10.5)

## 2017-01-24 LAB — TYPE AND SCREEN
ABO/RH(D): O NEG
ANTIBODY SCREEN: NEGATIVE

## 2017-01-24 MED ORDER — SOD CITRATE-CITRIC ACID 500-334 MG/5ML PO SOLN
30.0000 mL | ORAL | Status: DC | PRN
  Administered 2017-01-25: 30 mL via ORAL
  Filled 2017-01-24: qty 15

## 2017-01-24 MED ORDER — ONDANSETRON HCL 4 MG/2ML IJ SOLN
4.0000 mg | Freq: Four times a day (QID) | INTRAMUSCULAR | Status: DC | PRN
  Administered 2017-01-25: 4 mg via INTRAVENOUS
  Filled 2017-01-24: qty 2

## 2017-01-24 MED ORDER — LIDOCAINE HCL (PF) 1 % IJ SOLN
30.0000 mL | INTRAMUSCULAR | Status: DC | PRN
  Administered 2017-01-25: 30 mL via SUBCUTANEOUS
  Filled 2017-01-24: qty 30

## 2017-01-24 MED ORDER — TERBUTALINE SULFATE 1 MG/ML IJ SOLN
0.2500 mg | Freq: Once | INTRAMUSCULAR | Status: DC | PRN
Start: 2017-01-24 — End: 2017-01-25
  Filled 2017-01-24: qty 1

## 2017-01-24 MED ORDER — OXYTOCIN 40 UNITS IN LACTATED RINGERS INFUSION - SIMPLE MED
2.5000 [IU]/h | INTRAVENOUS | Status: DC
  Filled 2017-01-24: qty 1000

## 2017-01-24 MED ORDER — ACETAMINOPHEN 325 MG PO TABS: 650.0000 mg | ORAL_TABLET | ORAL | Status: DC | PRN

## 2017-01-24 MED ORDER — FLEET ENEMA 7-19 GM/118ML RE ENEM: 1.0000 | ENEMA | RECTAL | Status: DC | PRN

## 2017-01-24 MED ORDER — LACTATED RINGERS IV SOLN
INTRAVENOUS | Status: DC
  Administered 2017-01-24: 19:00:00 via INTRAVENOUS
  Administered 2017-01-25: 125 mL/h via INTRAVENOUS

## 2017-01-24 MED ORDER — LACTATED RINGERS IV SOLN: 500.0000 mL | INTRAVENOUS | Status: DC | PRN

## 2017-01-24 MED ORDER — OXYTOCIN BOLUS FROM INFUSION
500.0000 mL | Freq: Once | INTRAVENOUS | Status: AC
  Administered 2017-01-25: 500 mL via INTRAVENOUS

## 2017-01-24 MED ORDER — OXYTOCIN 40 UNITS IN LACTATED RINGERS INFUSION - SIMPLE MED
1.0000 m[IU]/min | INTRAVENOUS | Status: DC
  Administered 2017-01-24: 4 m[IU]/min via INTRAVENOUS
  Administered 2017-01-24: 2 m[IU]/min via INTRAVENOUS
  Filled 2017-01-24: qty 1000

## 2017-01-24 MED ORDER — OXYCODONE-ACETAMINOPHEN 5-325 MG PO TABS: 1.0000 | ORAL_TABLET | ORAL | Status: DC | PRN

## 2017-01-24 MED ORDER — OXYCODONE-ACETAMINOPHEN 5-325 MG PO TABS: 2.0000 | ORAL_TABLET | ORAL | Status: DC | PRN

## 2017-01-24 NOTE — Progress Notes (Signed)
[name] is a 21 y.o. female presenting for induction of labor at 6139 0 weeks after pregnancy followed at [facility] for chronic hypertension predating pregnancy but with normal pressures throughout the pregnancy without requiring antihypertensives. At her 39 week visit blood pressures were 140s were identified and she will be scheduled for induction. Cervix is favorable at 2-1/2 cm 50% effaced -2 vertex well applied with a soft cervix. She denies headache scotomata right upper quadrant pain. OB History    Gravida Para Term Preterm AB Living   2 1 1     1    SAB TAB Ectopic Multiple Live Births           1     Past Medical History:  Diagnosis Date  . Anxiety   . Asthma    childhood  . Depression    post partum  . Dyspnea   . Headache   . Hypertension   . Irregular menstrual bleeding 01/31/2014  . Recurrent upper respiratory infection (URI)    Bronchitis  . Vaginal Pap smear, abnormal    HPV   Past Surgical History:  Procedure Laterality Date  . CHOLECYSTECTOMY    . CYST REMOVAL NECK    . TYMPANOSTOMY TUBE PLACEMENT    . TYMPANOSTOMY TUBE PLACEMENT     Family History: family history includes Arthritis in her mother; Bipolar disorder in her maternal grandmother; Birth defects in her father; Diabetes in her mother; Fibromyalgia in her mother; Heart disease in her maternal grandfather; Thyroid disease in her paternal grandmother. Social History:  reports that she has been smoking Cigarettes.  She has a 1.00 pack-year smoking history. She has never used smokeless tobacco. She reports that she does not drink alcohol or use drugs.     Maternal Diabetes: No Genetic Screening: Normal Maternal Ultrasounds/Referrals: Normal Fetal Ultrasounds or other Referrals:  None Maternal Substance Abuse:  No Significant Maternal Medications:   Significant Maternal Lab Results:  Lab values include: Group B Strep negative Other Comments:  None  Review of Systems  Gastrointestinal:  Negative.   Genitourinary: Negative for dysuria.  Neurological: Negative for headaches.  Psychiatric/Behavioral: Negative.    History   Last menstrual period 04/04/2016. Exam Physical Exam  Constitutional: She appears well-developed and well-nourished.  HENT:  Head: Normocephalic.  Eyes: Pupils are equal, round, and reactive to light.  Neck: Normal range of motion.  Cardiovascular: Normal rate and regular rhythm.   Respiratory: Effort normal.  GI: Soft. Bowel sounds are normal.  Genitourinary: Vagina normal.  Genitourinary Comments: Gravid uterus consistent with dates vertex presentation   nonstress test reactive Prenatal labs: ABO, Rh: --/--/O NEG (08/05 1658) Antibody: NEG (08/05 1658) Rubella: 4.89 (01/16 0956) RPR: Non Reactive (08/05 1658)  HBsAg: Negative (01/16 0956)  HIV:    GBS: Negative (07/30 0000)   Assessment/Plan: Pregnancy 39 weeks chronic hypertension on no meds with borderline elevation of pressures today at 140/80 Breast-feeding Uncertain birth control   plan admit for Pitocin induction of labor   [name] 01/24/2017, 5:49 PM

## 2017-01-24 NOTE — Progress Notes (Signed)
[name] is a 21 y.o. female High Risk Pregnancy HROB Diagnosis(es):   CHTN no meds  G2P1001 8964w0d Estimated Date of Delivery: 01/31/17    HPI: The patient is being seen today for ongoing management of as above. Pt was being considered for IOL at 40wk for Hx CHTN on no meds but todays pressures are 140, so will proceed towards IOL this week.  Today she reports no complaints. Patient reports good fetal movement, denies any bleeding and no rupture of membranes symptoms or regular contractions.   BP weight and urine results reviewed and noted. Blood pressure 140/80, pulse 80, weight 257 lb 9.6 oz (116.8 kg), last menstrual period 04/04/2016.  Fundal Height:  37cm Fetal Heart rate:  145 with axels to 250 bpm Physical Examination: Abdomen - soft, nontender, nondistended, no masses or organomegaly                                     Pelvic -  2.5 cm Cervix, 50 % thinned out, vertex                                     Edema:  none  Urinalysis: 1 glucose Fetal Surveillance Testing today:  NST reactive  Lab and sonogram results have been reviewed.  Assessment:  1.  Pregnancy at 4064w0d,  G2P1001   :  Estimated Date of Delivery: 01/31/17                        2.  CHTN no meds slight elevation of BP's                        3. ASCUS/HPV, Colpo:08/17/2016:repeat6-8wkspp  Medication(s) Plans:  none  Treatment Plan: Schedule IOL for today. [facility] 6:30 PM today Note: Pt also wants a paternity/DNA test, she states the FOB is requesting it    By signing my name below, I, Izna Ahmed, attest that this documentation has been prepared under the direction and in the presence of Tilda BurrowFerguson, Catharine Kettlewell V, MD. Electronically Signed: Redge GainerIzna Ahmed, Medical Scribe. 01/24/17. 1:52 PM.  I personally performed the services described in this documentation, which was SCRIBED in my presence. The recorded information has been reviewed and considered accurate. It has been edited  as necessary during review. Tilda BurrowFERGUSON,Kymberlee Viger V, MD

## 2017-01-24 NOTE — Progress Notes (Signed)
Patient ID: [name], female   DOB: Feb 28, 1996, 21 y.o.   MRN: [id number] Labor Progress Note  S: Patient seen & examined for progress of labor. Patient comfortable.   O: BP 135/74   Pulse 88   Temp 98.7 F (37.1 C) (Oral)   Resp 16   Ht 5\' 7"  (1.702 m)   Wt 116.6 kg (257 lb)   LMP 04/04/2016 (Exact Date)   BMI 40.25 kg/m   FHT: 125bpm, mod var, +accels, no decels TOCO: irregular, patient looks comfortable during contractions  CVE: Dilation: 3.5 Effacement (%): 50 Cervical Position: Anterior Station: Ballotable Presentation: Vertex Exam by:: Dr. Talbert ForestShirley  A&P: 21 y.o. G2P1001 616w0d here for IOL for cHTN  Currently on 14 milli-units of pitocin Continue current management Anticipate SVD  [place]azil[place] Doc Mandala, DO FM Resident PGY-1 01/24/2017 10:32 PM

## 2017-01-24 NOTE — H&P (Signed)
OBSTETRIC ADMISSION HISTORY AND PHYSICAL  [name] is a 21 y.o. female G2P1001 with IUP at [redacted]w[redacted]d by 9wk [place] presenting for IOL for chronic HTN; not on any medication this pregnancy; BP noted to be elevated (140 systolic) in office today. She reports +FMs, No LOF, no VB, no blurry vision, headaches or peripheral edema, and RUQ pain.  She plans on Breast feeding. She request depo, but undecided for birth control. She received her prenatal care at [facility]   Dating: By 1OX [place] --->  Estimated Date of Delivery: 01/31/17  Sono:   CWD, normal anatomy, Cephalic   Prenatal History/Complications: [facility]  Initiated Care at  9wks  FOB [name]  Dating By U/S 9wk  Pap 07/20/16 ASCUS w/ +HRHPV   Colpo: mild changes, repeat colpo 6-8wks pp  GC/CT Initial:  -/+   POC:  -/-            36+wks:  Genetic Screen NT/IT: neg  CF screen declined  Anatomic [place] Normal female '[place]'  Flu vaccine Declined 07/20/16   Tdap Recommended ~ 28wks  Glucose Screen  2 hr normal: 87/153/126  GBS   Feed Preference breast  Contraception depo  Circumcision n/a  Childbirth Classes declined  Pediatrician [facility]    Past Medical History: Past Medical History:  Diagnosis Date  . Anxiety   . Asthma    childhood  . Depression    post partum  . Dyspnea   . Headache   . Hypertension   . Irregular menstrual bleeding 01/31/2014  . Recurrent upper respiratory infection (URI)    Bronchitis  . Vaginal Pap smear, abnormal    HPV    Past Surgical History: Past Surgical History:  Procedure Laterality Date  . CHOLECYSTECTOMY    . CYST REMOVAL NECK    . TYMPANOSTOMY TUBE PLACEMENT    . TYMPANOSTOMY TUBE PLACEMENT      Obstetrical History: OB History    Gravida Para Term Preterm AB Living   2 1 1     1    SAB TAB Ectopic Multiple Live Births           1      Social History: Social History   Social History  . Marital status: Single    Spouse name: N/A  . Number of  children: N/A  . Years of education: N/A   Social History Main Topics  . Smoking status: Current Every Day Smoker    Packs/day: 0.25    Years: 4.00    Types: Cigarettes  . Smokeless tobacco: Never Used     Comment: trying to quit-educated on smoking during pregnancy  . Alcohol use No     Comment: socially  . Drug use: No     Comment: denies use 06/20/14  . Sexual activity: Not Currently    Birth control/ protection: None   Other Topics Concern  . None   Social History Narrative  . None    Family History: Family History  Problem Relation Age of Onset  . Bipolar disorder Maternal Grandmother   . Heart disease Maternal Grandfather   . Thyroid disease Paternal Grandmother   . Arthritis Mother   . Fibromyalgia Mother   . Diabetes Mother   . Birth defects Father     Allergies: No Known Allergies  Prescriptions Prior to Admission  Medication Sig Dispense Refill Last Dose  . acyclovir (ZOVIRAX) 400 MG tablet Take 1 tablet (400 mg total) by mouth 3 (three) times  daily. (Patient not taking: Reported on 01/24/2017) 90 tablet 2 Not Taking  . escitalopram (LEXAPRO) 10 MG tablet Take 1 tablet (10 mg total) by mouth daily. 30 tablet 6 Taking  . metroNIDAZOLE (FLAGYL) 500 MG tablet Take 1 tablet (500 mg total) by mouth 2 (two) times daily. (Patient not taking: Reported on 01/24/2017) 14 tablet 0 Not Taking  . omeprazole (PRILOSEC) 20 MG capsule Take 1 capsule (20 mg total) by mouth daily. (Patient taking differently: Take 20 mg by mouth 2 (two) times daily. ) 30 capsule 6 Taking  . ranitidine (ZANTAC) 300 MG tablet Take 300 mg by mouth at bedtime.    Taking     Review of Systems   All systems reviewed and negative except as stated in HPI  Blood pressure 118/67, pulse 97, temperature 98.7 F (37.1 C), temperature source Oral, resp. rate 16, height 5\' 7"  (1.702 m), weight 257 lb (116.6 kg), last menstrual period 04/04/2016. General appearance: alert, cooperative and appears stated  age Lungs: clear to auscultation bilaterally Heart: regular rate and rhythm Abdomen: soft, non-tender; bowel sounds normal Extremities: Homans sign is negative, no sign of DVT Presentation: cephalic Fetal monitoringBaseline: 130 bpm, Variability: Good {> 6 bpm), Accelerations: Reactive and Decelerations: Absent Uterine activity2-5 Dilation: 3 Effacement (%): 60 Station: -2 Exam by:: Malva CoganA. Schwarz RN    Prenatal labs: ABO, Rh: --/--/O NEG (08/13 1907) Antibody: NEG (08/13 1907) Rubella: 4.89 (01/16 0956) RPR: Non Reactive (08/05 1658)  HBsAg: Negative (01/16 0956)  HIV:   Negative GBS: Negative (07/30 0000)  2 hr Glucola Negative Genetic screening Normal Anatomy US WNL  Prenatal Transfer Tool  Maternal Diabetes: No Genetic Screening: Normal Maternal Ultrasounds/Referrals: Normal Fetal Ultrasounds or other Referrals:  None Maternal Substance Abuse:  No Significant Maternal Medications:  None Significant Maternal Lab Results: None  Results for orders placed or performed during the hospital encounter of 01/24/17 (from the past 24 hour(s))  CBC   Collection Time: 01/24/17  6:25 PM  Result Value Ref Range   WBC 14.1 (H) 4.0 - 10.5 K/uL   RBC 4.05 3.87 - 5.11 MIL/uL   Hemoglobin 12.5 12.0 - 15.0 g/dL   HCT 16.135.5 (L) 09.636.0 - 04.546.0 %   MCV 87.7 78.0 - 100.0 fL   MCH 30.9 26.0 - 34.0 pg   MCHC 35.2 30.0 - 36.0 g/dL   RDW 40.914.1 81.111.5 - 91.415.5 %   Platelets 230 150 - 400 K/uL  Comprehensive metabolic panel   Collection Time: 01/24/17  6:25 PM  Result Value Ref Range   Sodium 133 (L) 135 - 145 mmol/L   Potassium 4.5 3.5 - 5.1 mmol/L   Chloride 105 101 - 111 mmol/L   CO2 15 (L) 22 - 32 mmol/L   Glucose, Bld 166 (H) 65 - 99 mg/dL   BUN 6 6 - 20 mg/dL   Creatinine, Ser 7.820.66 0.44 - 1.00 mg/dL   Calcium 8.8 (L) 8.9 - 10.3 mg/dL   Total Protein 6.7 6.5 - 8.1 g/dL   Albumin 3.0 (L) 3.5 - 5.0 g/dL   AST 41 15 - 41 U/L   ALT 10 (L) 14 - 54 U/L   Alkaline Phosphatase 177 (H) 38 - 126  U/L   Total Bilirubin 0.9 0.3 - 1.2 mg/dL   GFR calc non Af Amer >60 >60 mL/min   GFR calc Af Amer >60 >60 mL/min   Anion gap 13 5 - 15  Protein / creatinine ratio, urine   Collection Time: 01/24/17  6:30 PM  Result Value Ref Range   Creatinine, Urine 185.00 mg/dL   Total Protein, Urine 26 mg/dL   Protein Creatinine Ratio 0.14 0.00 - 0.15 mg/mg[Cre]  Type and screen   Collection Time: 01/24/17  7:07 PM  Result Value Ref Range   ABO/RH(D) O NEG    Antibody Screen NEG    Sample Expiration 01/27/2017   Results for orders placed or performed in visit on 01/24/17 (from the past 24 hour(s))  POCT urinalysis dipstick   Collection Time: 01/24/17  1:39 PM  Result Value Ref Range   Color, UA     Clarity, UA     Glucose, UA 1    Bilirubin, UA     Ketones, UA neg    Spec Grav, UA  1.010 - 1.025   Blood, UA neg    pH, UA  5.0 - 8.0   Protein, UA neg    Urobilinogen, UA  0.2 or 1.0 E.U./dL   Nitrite, UA neg    Leukocytes, UA Negative Negative    Patient Active Problem List   Diagnosis Date Noted  . Chronic hypertension in pregnancy 01/24/2017  . Labor and delivery, indication for care 01/16/2017  . Normal labor 01/16/2017  . Chronic hypertension 01/02/2017  . Abnormal EKG 01/02/2017  . Tachycardia 01/02/2017  . BV (bacterial vaginosis) 10/13/2016  . Abnormal Pap smear of cervix 07/26/2016  . Vaginal bleeding in pregnancy, first trimester 07/20/2016  . Rh negative state in antepartum period 07/20/2016  . Susceptible to varicella (non-immune), currently pregnant 07/20/2016  . Chlamydia 07/01/2016  . Borderline high blood pressure 07/01/2016  . Supervision of normal pregnancy 05/31/2016  . Irregular menstrual bleeding 01/31/2014  . History of depression 05/07/2013  . Smoker 05/07/2013    Assessment/Plan:  MALEIGHA COLVARD is a 21 y.o. G2P1001 at [redacted]w[redacted]d here for IOL for chronic HTN.  #Labor:IOL for CHTN at term. Favorable cervix. Plan IOL with pitocin #Pain: IV pain meds.  Epidural if desired #FWB: Cat1 #ID:  GBS negative #MOF: Breast #MOC:Maybe depo #Circ:  NA #gHTN- Mild range since admission. Continue to monitor BP  John Giovanni, MD  01/24/2017, 8:22 PM  OB FELLOW HISTORY AND PHYSICAL ATTESTATION  I have seen and examined this patient; I agree with above documentation in the resident's note.  --IOL for chronic HTN with BP elevated in the office today --CBC/CMP/UPC wnl with UPC 0.14  Frederik Pear, MD OB Fellow 01/24/2017, 8:54 PM

## 2017-01-25 ENCOUNTER — Inpatient Hospital Stay (HOSPITAL_COMMUNITY): Payer: Medicaid Other | Admitting: Anesthesiology

## 2017-01-25 ENCOUNTER — Encounter (HOSPITAL_COMMUNITY): Payer: Self-pay | Admitting: Anesthesiology

## 2017-01-25 DIAGNOSIS — Z3A39 39 weeks gestation of pregnancy: Secondary | ICD-10-CM

## 2017-01-25 LAB — CBC
HEMATOCRIT: 34.6 % — AB (ref 36.0–46.0)
HEMATOCRIT: 31.7 % — AB (ref 36.0–46.0)
HEMOGLOBIN: 12 g/dL (ref 12.0–15.0)
Hemoglobin: 11.1 g/dL — ABNORMAL LOW (ref 12.0–15.0)
MCH: 29.9 pg (ref 26.0–34.0)
MCH: 30.6 pg (ref 26.0–34.0)
MCHC: 34.7 g/dL (ref 30.0–36.0)
MCHC: 35 g/dL (ref 30.0–36.0)
MCV: 86.3 fL (ref 78.0–100.0)
MCV: 87.3 fL (ref 78.0–100.0)
Platelets: 195 10*3/uL (ref 150–400)
Platelets: 181 10*3/uL (ref 150–400)
RBC: 4.01 MIL/uL (ref 3.87–5.11)
RBC: 3.63 MIL/uL — ABNORMAL LOW (ref 3.87–5.11)
RDW: 14.1 % (ref 11.5–15.5)
RDW: 14.1 % (ref 11.5–15.5)
WBC: 16.3 10*3/uL — ABNORMAL HIGH (ref 4.0–10.5)
WBC: 16.5 10*3/uL — ABNORMAL HIGH (ref 4.0–10.5)

## 2017-01-25 LAB — HEMOGLOBIN AND HEMATOCRIT, BLOOD
HEMATOCRIT: 31.2 % — AB (ref 36.0–46.0)
Hemoglobin: 10.9 g/dL — ABNORMAL LOW (ref 12.0–15.0)

## 2017-01-25 LAB — RPR: RPR Ser Ql: NONREACTIVE

## 2017-01-25 MED ORDER — ONDANSETRON HCL 4 MG PO TABS: 4.0000 mg | ORAL_TABLET | ORAL | Status: DC | PRN

## 2017-01-25 MED ORDER — FENTANYL 2.5 MCG/ML BUPIVACAINE 1/10 % EPIDURAL INFUSION (WH - ANES)
INTRAMUSCULAR | Status: AC
  Filled 2017-01-25: qty 100

## 2017-01-25 MED ORDER — METHYLERGONOVINE MALEATE 0.2 MG/ML IJ SOLN
INTRAMUSCULAR | Status: AC
  Filled 2017-01-25: qty 1

## 2017-01-25 MED ORDER — LIDOCAINE HCL (PF) 1 % IJ SOLN
INTRAMUSCULAR | Status: DC | PRN
  Administered 2017-01-25 (×2): 7 mL via EPIDURAL

## 2017-01-25 MED ORDER — EPHEDRINE 5 MG/ML INJ
10.0000 mg | INTRAVENOUS | Status: DC | PRN
  Filled 2017-01-25: qty 2

## 2017-01-25 MED ORDER — PRENATAL MULTIVITAMIN CH
1.0000 | ORAL_TABLET | Freq: Every day | ORAL | Status: DC
  Administered 2017-01-26: 1 via ORAL
  Filled 2017-01-25: qty 1

## 2017-01-25 MED ORDER — MISOPROSTOL 200 MCG PO TABS
ORAL_TABLET | ORAL | Status: AC
  Filled 2017-01-25: qty 4

## 2017-01-25 MED ORDER — SENNOSIDES-DOCUSATE SODIUM 8.6-50 MG PO TABS
2.0000 | ORAL_TABLET | ORAL | Status: DC
  Filled 2017-01-25: qty 2

## 2017-01-25 MED ORDER — ACETAMINOPHEN 325 MG PO TABS: 650.0000 mg | ORAL_TABLET | ORAL | Status: DC | PRN

## 2017-01-25 MED ORDER — FENTANYL 2.5 MCG/ML BUPIVACAINE 1/10 % EPIDURAL INFUSION (WH - ANES)
14.0000 mL/h | INTRAMUSCULAR | Status: DC | PRN
  Administered 2017-01-25: 14 mL/h via EPIDURAL
  Filled 2017-01-25: qty 100

## 2017-01-25 MED ORDER — ONDANSETRON HCL 4 MG/2ML IJ SOLN: 4.0000 mg | INTRAMUSCULAR | Status: DC | PRN

## 2017-01-25 MED ORDER — PHENYLEPHRINE 40 MCG/ML (10ML) SYRINGE FOR IV PUSH (FOR BLOOD PRESSURE SUPPORT)
PREFILLED_SYRINGE | INTRAVENOUS | Status: DC
Start: 2017-01-25 — End: 2017-01-25
  Filled 2017-01-25: qty 10

## 2017-01-25 MED ORDER — MISOPROSTOL 200 MCG PO TABS
800.0000 ug | ORAL_TABLET | Freq: Once | ORAL | Status: AC
  Administered 2017-01-25: 800 ug via VAGINAL

## 2017-01-25 MED ORDER — FENTANYL CITRATE (PF) 100 MCG/2ML IJ SOLN
100.0000 ug | INTRAMUSCULAR | Status: DC | PRN
  Administered 2017-01-25: 100 ug via INTRAVENOUS
  Filled 2017-01-25: qty 2

## 2017-01-25 MED ORDER — SIMETHICONE 80 MG PO CHEW: 80.0000 mg | CHEWABLE_TABLET | ORAL | Status: DC | PRN

## 2017-01-25 MED ORDER — DIPHENHYDRAMINE HCL 25 MG PO CAPS: 25.0000 mg | ORAL_CAPSULE | Freq: Four times a day (QID) | ORAL | Status: DC | PRN

## 2017-01-25 MED ORDER — BENZOCAINE-MENTHOL 20-0.5 % EX AERO
1.0000 "application " | INHALATION_SPRAY | CUTANEOUS | Status: DC | PRN
  Administered 2017-01-26: 1 via TOPICAL
  Filled 2017-01-25: qty 56

## 2017-01-25 MED ORDER — IBUPROFEN 600 MG PO TABS
600.0000 mg | ORAL_TABLET | Freq: Four times a day (QID) | ORAL | Status: DC
  Administered 2017-01-25 – 2017-01-26 (×4): 600 mg via ORAL
  Filled 2017-01-25 (×4): qty 1

## 2017-01-25 MED ORDER — DIBUCAINE 1 % RE OINT: 1.0000 "application " | TOPICAL_OINTMENT | RECTAL | Status: DC | PRN

## 2017-01-25 MED ORDER — ZOLPIDEM TARTRATE 5 MG PO TABS: 5.0000 mg | ORAL_TABLET | Freq: Every evening | ORAL | Status: DC | PRN

## 2017-01-25 MED ORDER — DIPHENHYDRAMINE HCL 50 MG/ML IJ SOLN: 12.5000 mg | INTRAMUSCULAR | Status: DC | PRN

## 2017-01-25 MED ORDER — WITCH HAZEL-GLYCERIN EX PADS: 1.0000 "application " | MEDICATED_PAD | CUTANEOUS | Status: DC | PRN

## 2017-01-25 MED ORDER — PHENYLEPHRINE 40 MCG/ML (10ML) SYRINGE FOR IV PUSH (FOR BLOOD PRESSURE SUPPORT)
80.0000 ug | PREFILLED_SYRINGE | INTRAVENOUS | Status: DC | PRN
  Filled 2017-01-25: qty 5

## 2017-01-25 MED ORDER — TETANUS-DIPHTH-ACELL PERTUSSIS 5-2.5-18.5 LF-MCG/0.5 IM SUSP
0.5000 mL | Freq: Once | INTRAMUSCULAR | Status: AC
  Administered 2017-01-26: 0.5 mL via INTRAMUSCULAR
  Filled 2017-01-25: qty 0.5

## 2017-01-25 MED ORDER — LACTATED RINGERS IV SOLN
500.0000 mL | Freq: Once | INTRAVENOUS | Status: AC
  Administered 2017-01-25: 500 mL via INTRAVENOUS

## 2017-01-25 MED ORDER — COCONUT OIL OIL
1.0000 "application " | TOPICAL_OIL | Status: DC | PRN
  Filled 2017-01-25: qty 120

## 2017-01-25 MED ORDER — METHYLERGONOVINE MALEATE 0.2 MG/ML IJ SOLN
0.2000 mg | Freq: Once | INTRAMUSCULAR | Status: AC
  Administered 2017-01-25: 0.2 mg via INTRAMUSCULAR

## 2017-01-25 NOTE — Anesthesia Procedure Notes (Addendum)
Epidural Patient location during procedure: OB Start time: 01/25/2017 4:05 AM End time: 01/25/2017 4:09 AM  Staffing Anesthesiologist: Leilani AbleHATCHETT, Nikiyah Fackler Performed: anesthesiologist   Preanesthetic Checklist Completed: patient identified, surgical consent, pre-op evaluation, timeout performed, IV checked, risks and benefits discussed and monitors and equipment checked  Epidural Patient position: sitting Prep: site prepped and draped and DuraPrep Patient monitoring: continuous pulse ox and blood pressure Approach: midline Location: L3-L4 Injection technique: LOR air  Needle:  Needle type: Tuohy  Needle gauge: 17 G Needle length: 9 cm and 9 Needle insertion depth: 9 cm Catheter type: closed end flexible Catheter size: 19 Gauge Catheter at skin depth: 15 cm Test dose: negative and Other  Assessment Sensory level: T9 Events: blood not aspirated, injection not painful, no injection resistance, negative IV test and no paresthesia

## 2017-01-25 NOTE — Progress Notes (Signed)
Patient ID: [name], female   DOB: 1995-10-10, 21 y.o.   MRN: [id number] Labor Progress Note  S: Patient seen & examined for progress of labor. Patient comfortable.   O: BP (!) 91/52   Pulse 82   Temp 98.7 F (37.1 C) (Oral)   Resp 16   Ht 5\' 7"  (1.702 m)   Wt 116.6 kg (257 lb)   LMP 04/04/2016 (Exact Date)   BMI 40.25 kg/m   FHT: 120bpm, mod var, +accels, no decels- difficult to trace at times TOCO: irregular, patient looks comfortable during contractions  CVE: Dilation: 3.5 Effacement (%): 50 Cervical Position: Anterior Station: Ballotable Presentation: Vertex Exam by:: Dr. Talbert ForestShirley  A&P: 21 y.o. G2P1001 5023w1d here for IOL for cHTN  Currently on 1518milli-units of pitocin Continue induction management Anticipate SVD  [place]azil[place]Jordan Burdett Pinzon, DO FM Resident PGY-1 01/25/2017 2:32 AM

## 2017-01-25 NOTE — Anesthesia Preprocedure Evaluation (Signed)
Anesthesia Evaluation  Patient identified by MRN, date of birth, ID band Patient awake    Reviewed: Allergy & Precautions, H&P , NPO status , Patient's Chart, lab work & pertinent test results  Airway Mallampati: II  TM Distance: >3 FB Neck ROM: full    Dental no notable dental hx. (+) Teeth Intact   Pulmonary Current Smoker,    Pulmonary exam normal breath sounds clear to auscultation       Cardiovascular hypertension, Normal cardiovascular exam Rhythm:regular Rate:Normal     Neuro/Psych    GI/Hepatic negative GI ROS, Neg liver ROS,   Endo/Other  Morbid obesity  Renal/GU negative Renal ROS     Musculoskeletal   Abdominal (+) + obese,   Peds  Hematology negative hematology ROS (+)   Anesthesia Other Findings   Reproductive/Obstetrics (+) Pregnancy                             Anesthesia Physical Anesthesia Plan  ASA: III  Anesthesia Plan: Epidural   Post-op Pain Management:    Induction:   PONV Risk Score and Plan:   Airway Management Planned:   Additional Equipment:   Intra-op Plan:   Post-operative Plan:   Informed Consent: I have reviewed the patients History and Physical, chart, labs and discussed the procedure including the risks, benefits and alternatives for the proposed anesthesia with the patient or authorized representative who has indicated his/her understanding and acceptance.     Plan Discussed with:   Anesthesia Plan Comments:         Anesthesia Quick Evaluation

## 2017-01-25 NOTE — Lactation Note (Addendum)
This note was copied from a baby's chart. Lactation Consultation Note  Patient Name: Girl Delice Leschrin Clifton ZOXWR'UToday's Date: 01/25/2017 Reason for consult: Initial assessment  Initial visit attempted @ 7 hours of life. Mom was trying to do skin-to-skin w/infant, but falling asleep in bed. I informed Mom that I couldn't allow her to fall asleep with infant in bed. Mom said that infant hadn't yet eaten; I offered to do hand expression & spoon feeding so infant could be fed and then put into bassinet, but Mom declined.  RN made aware of the above.  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 Mom w/PPH (EBL of 1 liter at birth, followed by an additional 300mLs later).  Lurline HareRichey, Raynee [facility] 01/25/2017, 5:25 PM

## 2017-01-25 NOTE — Progress Notes (Signed)
Called back to room a hour after delivery from RN stating that patient is still bleeding and clots have been expressed. EBL ~300. Went to examine patient and found her to be firm. Was able to manually remove more clots from lower uterine segment. Patient ordered to be given methergine. BPs have been wnl. She is s/p cytotec 800mcg, pitocin bolus x2. Will continue to monitor and assess. Hbg right after delivery dropped to 11. Will get another H&H in 4 hours.   [name], DO OB Fellow Faculty Practice, [facility] 01/25/2017, 11:58 AM

## 2017-01-25 NOTE — Anesthesia Postprocedure Evaluation (Signed)
Anesthesia Post Note  Patient: [name]  Procedure(s) Performed: * No procedures listed *     Patient location during evaluation: Mother Baby Anesthesia Type: Epidural Level of consciousness: awake and alert and oriented Pain management: satisfactory to patient Vital Signs Assessment: post-procedure vital signs reviewed and stable Respiratory status: respiratory function stable and spontaneous breathing Cardiovascular status: stable Postop Assessment: no headache, no backache, epidural receding, patient able to bend at knees, no signs of nausea or vomiting and adequate PO intake Anesthetic complications: no    Last Vitals:  Vitals:   01/25/17 1300 01/25/17 1400  BP: (!) 147/80 128/75  Pulse: 71 70  Resp: 18 18  Temp: 36.7 C 37 C  SpO2:      Last Pain:  Vitals:   01/25/17 1400  TempSrc: Oral  PainSc: 0-No pain   Pain Goal:                 Sevan Mcbroom

## 2017-01-25 NOTE — Progress Notes (Signed)
Patient ID: [name], female   DOB: [birth year], 21 y.o.   MRN: [id number] Labor Progress Note  S: Patient seen & examined for progress of labor. Patient comfortable.   O: BP (!) 81/46 Comment: pt laying left lateral  Pulse 73   Temp 98.7 F (37.1 C) (Oral)   Resp 16   Ht 5\' 7"  (1.702 m)   Wt 116.6 kg (257 lb)   LMP 04/04/2016 (Exact Date)   BMI 40.25 kg/m   FHT: 120bpm, mod var, +accels, no decels TOCO: irregular, patient looks comfortable during contractions  CVE: Dilation: 3.5 Effacement (%): 50 Cervical Position: Anterior Station: Ballotable Presentation: Vertex Exam by:: Dr. Talbert ForestShirley  A&P: 21 y.o. G2P1001 457w1d here for IOL for cHTN  Currently on 20 milli-units of pitocin, AROM 0315, IUPC placed Continue induction management Anticipate SVD  [place]il[place]Jordan Chamia Schmutz, DO FM Resident PGY-1 01/25/2017 3:28 AM

## 2017-01-26 LAB — CBC
HCT: 26.5 % — ABNORMAL LOW (ref 36.0–46.0)
HEMOGLOBIN: 9.4 g/dL — AB (ref 12.0–15.0)
MCH: 30.9 pg (ref 26.0–34.0)
MCHC: 35.5 g/dL (ref 30.0–36.0)
MCV: 87.2 fL (ref 78.0–100.0)
Platelets: 181 10*3/uL (ref 150–400)
RBC: 3.04 MIL/uL — ABNORMAL LOW (ref 3.87–5.11)
RDW: 14.3 % (ref 11.5–15.5)
WBC: 13.7 10*3/uL — ABNORMAL HIGH (ref 4.0–10.5)

## 2017-01-26 MED ORDER — IBUPROFEN 600 MG PO TABS: 600.0000 mg | ORAL_TABLET | Freq: Four times a day (QID) | ORAL | 0 refills | Status: DC

## 2017-01-26 MED ORDER — MEDROXYPROGESTERONE ACETATE 150 MG/ML IM SUSP: 150.0000 mg | Freq: Once | INTRAMUSCULAR | Status: DC

## 2017-01-26 NOTE — Discharge Summary (Signed)
OB Discharge Summary  Patient Name: [name] DOB: 1996-03-04 MRN: [id number]  Date of admission: 01/24/2017 Delivering MD:    Reubin Milan [[id number]]      [place], Girl Denny Peon [[id number]]  Caryl Ada Y   Date of discharge: 01/26/2017  Admitting diagnosis: INDUCTION Intrauterine pregnancy: [redacted]w[redacted]d     Secondary diagnosis:Active Problems:   Chronic hypertension in pregnancy   SVD (spontaneous vaginal delivery)  Additional problems: morbid obesity     Discharge diagnosis: Term Pregnancy Delivered and Gestational Hypertension        Augmentation: Pitocin  Complications: None  Hospital course:  Induction of Labor With Vaginal Delivery   21 y.o. yo G2P2002 at [redacted]w[redacted]d was admitted to the hospital 01/24/2017 for induction of labor.  Indication for induction: Gestational hypertension.  Patient had an uncomplicated labor course as follows: Membrane Rupture Time/DateReubin Milan [[id number]]  11:00 AM   Ephriam Knuckles Girl [place] [[id number]]  3:25 AM ,   Reubin Milan [[id number]]  01/15/2017   Ephriam Knuckles Girl Yuval [[id number]]  01/25/2017   Intrapartum Procedures: Episiotomy:    Reubin Milan [[id number]]      Ephriam Knuckles, Girl Kayle [[id number]]  None [1]                                         Lacerations:     Reubin Milan [[id number]]      Ephriam Knuckles, Girl Lashawna [[id number]]  2nd degree [3]  Patient had delivery of a Viable infant.  Information for the patient's newborn:  Reubin Milan [[id number]]    Information for the patient's newborn:  Brittie, Whisnant [[id number]]        Reubin Milan [[id number]]    Ephriam Knuckles Girl [place] [[id number]]  01/25/2017  Details of delivery can be found in separate delivery note.  Patient had a routine postpartum course. Patient is discharged home 01/26/17.  Physical exam  Vitals:   01/25/17 1400 01/25/17 1800 01/26/17 0201 01/26/17 0549  BP: 128/75 (!) 125/55 (!) 95/58 (!) 144/87   Pulse: 70 83 80 (!) 113  Resp: 18 20 20    Temp: 98.6 F (37 C) 98.3 F (36.8 C) 98.2 F (36.8 C) 97.8 F (36.6 C)  TempSrc: Oral Oral Oral Oral  SpO2:      Weight:      Height:       General: alert Lochia: appropriate Uterine Fundus: firm Incision: N/A DVT Evaluation: No evidence of DVT seen on physical exam. Labs: Lab Results  Component Value Date   WBC 13.7 (H) 01/26/2017   HGB 9.4 (L) 01/26/2017   HCT 26.5 (L) 01/26/2017   MCV 87.2 01/26/2017   PLT 181 01/26/2017   CMP Latest Ref Rng & Units 01/24/2017  Glucose 65 - 99 mg/dL 270(J)  BUN 6 - 20 mg/dL 6  Creatinine 5.00 - 9.38 mg/dL 1.82  Sodium 993 - 716 mmol/L 133(L)  Potassium 3.5 - 5.1 mmol/L 4.5  Chloride 101 - 111 mmol/L 105  CO2 22 - 32 mmol/L 15(L)  Calcium 8.9 - 10.3 mg/dL 9.6(V)  Total Protein 6.5 - 8.1 g/dL 6.7  Total Bilirubin 0.3 - 1.2 mg/dL 0.9  Alkaline Phos 38 - 126 U/L 177(H)  AST 15 - 41 U/L 41  ALT 14 - 54 U/L 10(L)    Discharge instruction: per After Visit Summary and "Baby and Me Booklet".  After Visit Meds:  Allergies as of 01/26/2017   No Known Allergies     Medication List    TAKE these medications   acyclovir 400 MG tablet Commonly known as:  ZOVIRAX Take 1 tablet (400 mg total) by mouth 3 (three) times daily.   escitalopram 10 MG tablet Commonly known as:  LEXAPRO Take 1 tablet (10 mg total) by mouth daily.   ibuprofen 600 MG tablet Commonly known as:  ADVIL,MOTRIN Take 1 tablet (600 mg total) by mouth every 6 (six) hours.   metroNIDAZOLE 500 MG tablet Commonly known as:  FLAGYL Take 1 tablet (500 mg total) by mouth 2 (two) times daily.   omeprazole 20 MG capsule Commonly known as:  PRILOSEC Take 1 capsule (20 mg total) by mouth daily. What changed:  when to take this   ranitidine 300 MG tablet Commonly known as:  ZANTAC Take 300 mg by mouth at bedtime.   ranitidine 150 MG tablet Commonly known as:  ZANTAC Take 150 mg by mouth daily as needed for heartburn. Pt  uses this med when the 300mg  isnt enough.       Diet: routine diet  Activity: Advance as tolerated. Pelvic rest for 6 weeks.   Outpatient follow up:4 weeks Follow up Appt:Future Appointments Date Time Provider Department Center  02/28/2017 10:45 AM Cheral MarkerBooker, Kimberly R, CNM [facility]   Follow up visit: No Follow-up on file.  Postpartum contraception: Depo Provera  Newborn Data:   Reubin MilanClifton, PendingBaby [[id number]][[id number]]  Live born unspecified sex  Birth Weight:   APGAR: ,    Ephriam KnucklesClifton, Girl Denny Peonrin [[id number]][[id number]]  Live born female  Birth Weight: 7 lb 8.3 oz (3410 g) APGAR: 9, 9  Baby Feeding: Breast Disposition:home with mother   01/26/2017 Allie BossierMyra C Shyvonne Chastang, MD

## 2017-01-26 NOTE — Lactation Note (Signed)
This note was copied from a baby's chart. Lactation Consultation Note  Patient Name: [name] HQION'GToday's Date: 01/26/2017 Reason for consult: Follow-up assessment;Infant weight loss;Nipple pain/trauma;Other (Comment) (3% weight loss, swollen areolas )  Baby is 28 hours old. Per mom the baby last fed at 1240 for 3 mins and prior to that feeding fed 25 mins , and per mom hearing swallows. The Latch [facility]. LC reviewed doc flow sheets.   Per mom experiencing sore nipples . LC offered to assess breast tissue and obtained mom's permission.  Both nipples pink in  Color , no breakdown, and areolas edematous , breast are feeling warm , and easily expressed milk.  LC showed mom how far back on the areolas baby should latch to enhance milk flow and decrease potential for soreness.  LC discussed sore nipple and engorgement prevention and tx. LC also instructed mom on the use hand pump and  Shells.  Due to edema gave her #27 Flange due to the areola edema and when milk comes in.  [facility] and is aware they are are a resource.  Mother informed of post-discharge support and given phone number to the lactation department, including services for phone call assistance; out-patient appointments; and breastfeeding support group. List of other breastfeeding resources in the community given in the handout. Encouraged mother to call for problems or concerns related to breastfeeding.    Maternal Data Has patient been taught Hand Expression?: Yes Does the patient have breastfeeding experience prior to this delivery?: Yes  Feeding Feeding Type: Breast Fed Length of feed: 3 min  LATCH Score Latch: Grasps breast easily, tongue down, lips flanged, rhythmical sucking.  Audible Swallowing: Spontaneous and intermittent  Type of Nipple: Everted at rest and after stimulation  Comfort (Breast/Nipple): Soft / non-tender  Hold (Positioning): No assistance needed to  correctly position infant at breast.  LATCH Score: 10  Interventions Interventions: Breast feeding basics reviewed  Lactation Tools Discussed/Used WIC Program: Yes Pump Review: Setup, frequency, and cleaning;Milk Storage Initiated by:: MAI  Date initiated:: 01/26/17   Consult Status Consult Status: Complete Date: 01/26/17    [name] 01/26/2017, 2:27 PM

## 2017-01-26 NOTE — Progress Notes (Signed)
MOB was referred for history of depression/anxiety. * Referral screened out by Clinical Social Worker because none of the following criteria appear to apply: ~ History of anxiety/depression during this pregnancy, or of post-partum depression. ~ Diagnosis of anxiety and/or depression within last 3 years OR * MOB's symptoms currently being treated with medication and/or therapy.  Please contact the Clinical Social Worker if needs arise, by MOB request, or if MOB scores 9 or greater/yes to question 10 on Edinburgh Postpartum Depression Screen.   [name], MSW, LCSW Clinical Social Work [phone]  

## 2017-01-27 ENCOUNTER — Emergency Department (HOSPITAL_COMMUNITY)
Admission: EM | Admit: 2017-01-27 | Discharge: 2017-01-27 | Disposition: A | Discharge status: Home / Self Care | Payer: Medicaid Other | Attending: Emergency Medicine | Admitting: Emergency Medicine

## 2017-01-27 ENCOUNTER — Encounter (HOSPITAL_COMMUNITY): Payer: Self-pay | Admitting: Emergency Medicine

## 2017-01-27 ENCOUNTER — Emergency Department (HOSPITAL_COMMUNITY): Payer: Medicaid Other

## 2017-01-27 DIAGNOSIS — J45909 Unspecified asthma, uncomplicated: Secondary | ICD-10-CM | POA: Insufficient documentation

## 2017-01-27 DIAGNOSIS — Z79899 Other long term (current) drug therapy: Secondary | ICD-10-CM | POA: Diagnosis not present

## 2017-01-27 DIAGNOSIS — F17210 Nicotine dependence, cigarettes, uncomplicated: Secondary | ICD-10-CM | POA: Insufficient documentation

## 2017-01-27 LAB — COMPREHENSIVE METABOLIC PANEL
ALT: 10 U/L — AB (ref 14–54)
AST: 18 U/L (ref 15–41)
Albumin: 2.7 g/dL — ABNORMAL LOW (ref 3.5–5.0)
Alkaline Phosphatase: 116 U/L (ref 38–126)
Anion gap: 9 (ref 5–15)
BUN: 8 mg/dL (ref 6–20)
CHLORIDE: 108 mmol/L (ref 101–111)
CO2: 22 mmol/L (ref 22–32)
CREATININE: 0.58 mg/dL (ref 0.44–1.00)
Calcium: 8.5 mg/dL — ABNORMAL LOW (ref 8.9–10.3)
GFR calc Af Amer: >60 mL/min (ref >60)
GFR calc non Af Amer: >60 mL/min (ref >60)
Glucose, Bld: 102 mg/dL — ABNORMAL HIGH (ref 65–99)
POTASSIUM: 3.5 mmol/L (ref 3.5–5.1)
SODIUM: 139 mmol/L (ref 135–145)
Total Bilirubin: 0.2 mg/dL — ABNORMAL LOW (ref 0.3–1.2)
Total Protein: 6.2 g/dL — ABNORMAL LOW (ref 6.5–8.1)

## 2017-01-27 LAB — CBC WITH DIFFERENTIAL/PLATELET
BASOS ABS: 0 10*3/uL (ref 0.0–0.1)
Basophils Relative: 0 %
EOS ABS: 0.1 10*3/uL (ref 0.0–0.7)
Eosinophils Relative: 1 %
HCT: 25.1 % — ABNORMAL LOW (ref 36.0–46.0)
Hemoglobin: 8.7 g/dL — ABNORMAL LOW (ref 12.0–15.0)
Lymphocytes Relative: 20 %
Lymphs Abs: 2.1 10*3/uL (ref 0.7–4.0)
MCH: 30.6 pg (ref 26.0–34.0)
MCHC: 34.7 g/dL (ref 30.0–36.0)
MCV: 88.4 fL (ref 78.0–100.0)
MONO ABS: 0.8 10*3/uL (ref 0.1–1.0)
Monocytes Relative: 8 %
NEUTROS PCT: 71 %
Neutro Abs: 7.6 10*3/uL (ref 1.7–7.7)
PLATELETS: 174 10*3/uL (ref 150–400)
RBC: 2.84 MIL/uL — AB (ref 3.87–5.11)
RDW: 14.1 % (ref 11.5–15.5)
WBC: 10.6 10*3/uL — AB (ref 4.0–10.5)

## 2017-01-27 MED ORDER — SODIUM CHLORIDE 0.9 % IV BOLUS (SEPSIS)
1000.0000 mL | Freq: Once | INTRAVENOUS | Status: AC
  Administered 2017-01-27: 1000 mL via INTRAVENOUS

## 2017-01-27 MED ORDER — MISOPROSTOL 200 MCG PO TABS: 200.0000 ug | ORAL_TABLET | Freq: Two times a day (BID) | ORAL | 0 refills | Status: DC

## 2017-01-27 MED ORDER — HYDROCODONE-ACETAMINOPHEN 5-325 MG PO TABS: 2.0000 | ORAL_TABLET | Freq: Four times a day (QID) | ORAL | 0 refills | Status: DC | PRN

## 2017-01-27 NOTE — ED Provider Notes (Signed)
[facility]EM[facility] DEPT Provider Note   CSN: [id number] Arrival date & time: 01/27/17  1418     History   Chief Complaint Chief Complaint  Patient presents with  . Vaginal Bleeding    HPI [name] is a 21 y.o. female who presents to the ED with heavy vaginal bleeding s/p SVD 2 days ago. Patient reports that they told her her placenta was attached to the uterus and caused more bleeding and pain. After d/c from the hospital the patient felt fine but this morning she reports heavy bleeding and lower abdominal pain. She also complains of feeling weak and dizzy.   HPI  Past Medical History:  Diagnosis Date  . Anxiety   . Asthma    childhood  . Depression    post partum  . Dyspnea   . Headache   . Hypertension   . Irregular menstrual bleeding 01/31/2014  . Recurrent upper respiratory infection (URI)    Bronchitis  . Vaginal Pap smear, abnormal    HPV    Patient Active Problem List   Diagnosis Date Noted  . SVD (spontaneous vaginal delivery) 01/25/2017  . Chronic hypertension in pregnancy 01/24/2017  . Labor and delivery, indication for care 01/16/2017  . Normal labor 01/16/2017  . Chronic hypertension 01/02/2017  . Abnormal EKG 01/02/2017  . Tachycardia 01/02/2017  . BV (bacterial vaginosis) 10/13/2016  . Abnormal Pap smear of cervix 07/26/2016  . Rh negative state in antepartum period 07/20/2016  . Susceptible to varicella (non-immune), currently pregnant 07/20/2016  . Chlamydia 07/01/2016  . Borderline high blood pressure 07/01/2016  . Supervision of normal pregnancy 05/31/2016  . History of depression 05/07/2013  . Smoker 05/07/2013    Past Surgical History:  Procedure Laterality Date  . CHOLECYSTECTOMY    . CYST REMOVAL NECK    . TYMPANOSTOMY TUBE PLACEMENT    . TYMPANOSTOMY TUBE PLACEMENT      OB History    Gravida Para Term Preterm AB Living   2 2 2     2    SAB TAB Ectopic Multiple Live Births         0 2       Home Medications    Prior to  Admission medications   Medication Sig Start Date End Date Taking? Authorizing Provider  acyclovir (ZOVIRAX) 400 MG tablet Take 1 tablet (400 mg total) by mouth 3 (three) times daily. Patient not taking: Reported on 01/24/2017 01/20/17   Lazaro ArmsEure, Luther H, MD  escitalopram (LEXAPRO) 10 MG tablet Take 1 tablet (10 mg total) by mouth daily. 10/13/16   Cheral MarkerBooker, Kimberly R, CNM  HYDROcodone-acetaminophen (NORCO/VICODIN) 5-325 MG tablet Take 2 tablets by mouth every 6 (six) hours as needed. 01/27/17   Janne NapoleonNeese, Illa Enlow M, NP  ibuprofen (ADVIL,MOTRIN) 600 MG tablet Take 1 tablet (600 mg total) by mouth every 6 (six) hours. 01/26/17   Allie Bossierove, Myra C, MD  misoprostol (CYTOTEC) 200 MCG tablet Take 1 tablet (200 mcg total) by mouth 2 (two) times daily. 01/27/17   Janne NapoleonNeese, Laresha Bacorn M, NP  omeprazole (PRILOSEC) 20 MG capsule Take 1 capsule (20 mg total) by mouth daily. Patient taking differently: Take 20 mg by mouth 2 (two) times daily.  11/17/16   Cresenzo-Dishmon, Scarlette CalicoFrances, CNM  ranitidine (ZANTAC) 150 MG tablet Take 150 mg by mouth daily as needed for heartburn. Pt uses this med when the 300mg  isnt enough.    [provider]  ranitidine (ZANTAC) 300 MG tablet Take 300 mg by mouth at bedtime.  [provider]    Family History Family History  Problem Relation Age of Onset  . Bipolar disorder Maternal Grandmother   . Heart disease Maternal Grandfather   . Thyroid disease Paternal Grandmother   . Arthritis Mother   . Fibromyalgia Mother   . Diabetes Mother   . Birth defects Father     Social History Social History  Substance Use Topics  . Smoking status: Current Every Day Smoker    Packs/day: 0.25    Years: 4.00    Types: Cigarettes  . Smokeless tobacco: Never Used     Comment: trying to quit-educated on smoking during pregnancy  . Alcohol use No     Comment: socially     Allergies   Patient has no known allergies.   Review of Systems Review of Systems  Constitutional: Negative for fever.    HENT: Negative.   Eyes: Negative for visual disturbance.  Respiratory: Negative for shortness of breath.   Gastrointestinal: Positive for abdominal pain. Negative for nausea and vomiting.  Genitourinary: Positive for pelvic pain and vaginal bleeding.  Musculoskeletal: Positive for back pain (mild).  Skin: Negative for rash.  Neurological: Positive for dizziness. Negative for syncope.  Psychiatric/Behavioral: Negative for confusion.     Physical Exam Updated Vital Signs BP 104/71   Pulse (!) 101   Temp 99.5 F (37.5 C)   Resp 14   Ht 5\' 7"  (1.702 m)   Wt 111.1 kg (245 lb)   LMP 04/04/2016 (Exact Date)   SpO2 99%   BMI 38.37 kg/m   Physical Exam  Constitutional: She is oriented to person, place, and time. She appears well-developed and well-nourished. No distress.  HENT:  Head: Normocephalic and atraumatic.  Eyes: EOM are normal.  Neck: Neck supple.  Cardiovascular: Tachycardia present.   Pulmonary/Chest: Effort normal.  Abdominal: Soft. There is tenderness (mild lower abdomen).  Genitourinary:  Genitourinary Comments: Pelvic exam by Dr. Emelda Fear Moderate blood vaginal vault. Cervix open with clots. Using ring forceps 2 blood clots approximately 2 cm removed, no tissue.   Musculoskeletal: Normal range of motion.  Neurological: She is alert and oriented to person, place, and time. No cranial nerve deficit.  Skin: Skin is warm and dry.  Psychiatric: She has a normal mood and affect. Her behavior is normal.  Nursing note and vitals reviewed.    ED Treatments / Results  Labs (all labs ordered are listed, but only abnormal results are displayed) Labs Reviewed  CBC WITH DIFFERENTIAL/PLATELET - Abnormal; Notable for the following:       Result Value   WBC 10.6 (*)    RBC 2.84 (*)    Hemoglobin 8.7 (*)    HCT 25.1 (*)    All other components within normal limits  COMPREHENSIVE METABOLIC PANEL - Abnormal; Notable for the following:    Glucose, Bld 102 (*)    Calcium  8.5 (*)    Total Protein 6.2 (*)    Albumin 2.7 (*)    ALT 10 (*)    Total Bilirubin 0.2 (*)    All other components within normal limits    EKG  EKG Interpretation  Date/Time:  Thursday January 27 2017 14:30:12 EDT Ventricular Rate:  129 PR Interval:  142 QRS Duration: 84 QT Interval:  286 QTC Calculation: 418 R Axis:   8 Text Interpretation:  Sinus tachycardia Septal infarct , age undetermined Abnormal ECG Confirmed by Geoffery Lyons ([id number]) on 01/27/2017 2:47:30 PM       Radiology US  Pelvis Complete  Result Date: 01/27/2017 CLINICAL DATA:  Bleeding after spontaneous vaginal delivery. EXAM: TRANSABDOMINAL ULTRASOUND OF PELVIS TECHNIQUE: Transabdominal ultrasound examination of the pelvis was performed including evaluation of the uterus, ovaries, adnexal regions, and pelvic cul-de-sac. COMPARISON:  None. FINDINGS: Uterus Measurements: 22 x 6.8 x 14 cm. No fibroids or other mass visualized. Endometrium Thickness: 5 mm. A nodular masslike region of thickening in the endometrial canal of the lower uterine segment measures 3.6 x 1.9 x 3.6 cm. There is adjacent blood flow but no definitive internal blood flow in this region. Right ovary Measurements: 6.6 x 2.4 x 4.4 cm. Normal appearance/no adnexal mass. Left ovary Measurements: 3.1 x 3.8 x 2.6 cm. Normal appearance/no adnexal mass. Other findings:  No abnormal free fluid. IMPRESSION: 1. There is an oval nodular collection in the endometrial canal of the lower uterine segment, extending towards the cervix measuring 3.6 x 1.9 x 3.6 cm with no definitive internal blood flow. While this could represent a clot given history of bleeding, retained products of conception are not excluded in the correct clinical setting. Electronically Signed   By: Gerome Sam III M.D   On: 01/27/2017 15:36    Procedures Procedures (including critical care time)  Medications Ordered in ED Medications  sodium chloride 0.9 % bolus 1,000 mL (1,000 mLs Intravenous  New Bag/Given 01/27/17 1449)   3:30 pm consult with Dr. Emelda Fear and he will see the patient here in the ED.  Initial Impression / Assessment and Plan / ED Course  I have reviewed the triage vital signs and the nursing notes.  Pertinent labs & imaging results that were available during my care of the patient were reviewed by me and considered in my medical decision making (see chart for details).   Final Clinical Impressions(s) / ED Diagnoses  21 y.o. female with heavy bleeding s/p SVD 2 days ago stable for d/c without hemorrhage and feeling better after IV fluids. Dr. Emelda Fear discussed with the patient plan of care. Patient will start Cytotec and f/u in the office. Return precautions discussed.   Final diagnoses:  Retained products of conception after delivery without hemorrhage    New Prescriptions New Prescriptions   HYDROCODONE-ACETAMINOPHEN (NORCO/VICODIN) 5-325 MG TABLET    Take 2 tablets by mouth every 6 (six) hours as needed.   MISOPROSTOL (CYTOTEC) 200 MCG TABLET    Take 1 tablet (200 mcg total) by mouth 2 (two) times daily.     Kerrie Buffalo [place], [place] 01/27/17 1631    Benjiman Core, MD 01/29/17 [phone]

## 2017-01-27 NOTE — Discharge Instructions (Signed)
Your ultrasound shows that you had some small pieces of clot left in the uterus. Dr. Emelda FearFerguson has removed clots and also wants you to have a medication to be sure that all the clots are gone. Do not breast feed while you are taking the medication. Call Dr. Rayna SextonFerguson's office for a follow up appointment.   Do not drive while you are taking the narcotic as it can make you sleepy.

## 2017-01-27 NOTE — ED Triage Notes (Signed)
Pt had induced vaginal delivery on Tuesday 01/25/17. Pt c/o increased vaginal bleeding and passing large blood clots. Pt also reports feeling weak.

## 2017-01-31 ENCOUNTER — Encounter: Payer: Self-pay | Admitting: Obstetrics and Gynecology

## 2017-01-31 ENCOUNTER — Telehealth: Payer: Self-pay | Admitting: Obstetrics and Gynecology

## 2017-01-31 ENCOUNTER — Ambulatory Visit (INDEPENDENT_AMBULATORY_CARE_PROVIDER_SITE_OTHER): Payer: Medicaid Other | Admitting: Obstetrics and Gynecology

## 2017-01-31 VITALS — BP 128/70 | HR 70 | Ht 67.0 in | Wt 250.5 lb

## 2017-01-31 DIAGNOSIS — O9089 Other complications of the puerperium, not elsewhere classified: Secondary | ICD-10-CM

## 2017-01-31 DIAGNOSIS — N853 Subinvolution of uterus: Secondary | ICD-10-CM

## 2017-01-31 DIAGNOSIS — B081 Molluscum contagiosum: Secondary | ICD-10-CM

## 2017-01-31 MED ORDER — MISOPROSTOL 200 MCG PO TABS: 200.0000 ug | ORAL_TABLET | Freq: Two times a day (BID) | ORAL | 0 refills | Status: DC

## 2017-01-31 NOTE — Progress Notes (Addendum)
[facility] Visit  01/31/2017            Patient name: [name] MRN [id number]  Date of birth: 06-13-1996  CC & HPI:  [name] is a 21 y.o. female presenting today for heavy vaginal bleeding Postpartum  with onset after SVD on 01/24/17. Pt was seen in ED on 01/27/17 for heavy vaginal bleeding. Patient reports that they told her her placenta was attached to the uterus and caused more bleeding and pain. Consult with Dr. Emelda Fear at this time, and pt was prescribed Cytotec and asked to f/u. Exam at [facility] that point removed clots from the lower uterine segment After d/c from the hospital the patient felt fine but this morning she reports heavy bleeding, passing clots, and lower abdominal pain. She also complains of feeling weak and dizzy. No alleviating factors noted. Pt is also concerned about molluscum contagiosum on vulval area and wants to discuss removal.  ROS:  ROS  +weakness +dizziness +heavy vaginal bleeding +abdominal pain   Pertinent History Reviewed:   Reviewed: Significant for SVD, irregular menstrual bleeding, abnormal pap smear Medical         Past Medical History:  Diagnosis Date  . Anxiety   . Asthma    childhood  . Depression    post partum  . Dyspnea   . Headache   . Hypertension   . Irregular menstrual bleeding 01/31/2014  . Recurrent upper respiratory infection (URI)    Bronchitis  . Vaginal Pap smear, abnormal    HPV                              Surgical Hx:    Past Surgical History:  Procedure Laterality Date  . CHOLECYSTECTOMY    . CYST REMOVAL NECK    . TYMPANOSTOMY TUBE PLACEMENT    . TYMPANOSTOMY TUBE PLACEMENT     Medications: Reviewed & Updated - see associated section                       Current Outpatient Prescriptions:  .  acyclovir (ZOVIRAX) 400 MG tablet, Take 1 tablet (400 mg total) by mouth 3 (three) times daily. (Patient taking differently: Take 400 mg by mouth as needed. ), Disp: 90 tablet, Rfl: 2 .   escitalopram (LEXAPRO) 10 MG tablet, Take 1 tablet (10 mg total) by mouth daily., Disp: 30 tablet, Rfl: 6 .  HYDROcodone-acetaminophen (NORCO/VICODIN) 5-325 MG tablet, Take 2 tablets by mouth every 6 (six) hours as needed., Disp: 10 tablet, Rfl: 0 .  ibuprofen (ADVIL,MOTRIN) 600 MG tablet, Take 1 tablet (600 mg total) by mouth every 6 (six) hours., Disp: 30 tablet, Rfl: 0 .  misoprostol (CYTOTEC) 200 MCG tablet, Take 1 tablet (200 mcg total) by mouth 2 (two) times daily., Disp: 6 tablet, Rfl: 0   Social History: Reviewed -  reports that she has been smoking Cigarettes.  She has a 1.00 pack-year smoking history. She has never used smokeless tobacco.  Objective Findings:  Vitals: Blood pressure 128/70, pulse 70, height 5\' 7"  (1.702 m), weight 250 lb 8 oz (113.6 kg), last menstrual period 04/04/2016, not currently breastfeeding.  Physical Examination: General appearance - alert, well appearing, and in no distress Mental status - alert, oriented to person, place, and time Pelvic -  VULVA: normal appearing vulva with no masses, tenderness or lesions,Multiple skin tags consistent with molluscum contagiosum patient wishes to  have them removed  VAGINA: normal appearing vagina with normal color and discharge, no lesions,  CERVIX: flipped open enough, normal appearing cervix without discharge or lesions,  UTERUS: uterus is normal size, shape, consistency and nontender,  ADNEXA: normal adnexa in size, nontender and no masses  Large number of clots removed from the lower segment of uterus, in upto 10 cm into the endometrial cavity probed with ring forceps and extracted. No purulence  Assessment & Plan:   A:  1. Uterine subinvolution 2. Molluscum contagiosum   P:  1. Finish Cytotec today, and renew prescription for 3 days 2. Schedule appointment for 2 weeks to remove skin tags (molluscum contagiosum)    By signing my name below, I, Izna Ahmed, attest that this documentation has been prepared  under the direction and in the presence of Tilda Burrow, MD. Electronically Signed: Redge Gainer, Medical Scribe. 01/31/17. 12:06 PM.  I personally performed the services described in this documentation, which was SCRIBED in my presence. The recorded information has been reviewed and considered accurate. It has been edited as necessary during review. Tilda Burrow, MD

## 2017-01-31 NOTE — Patient Instructions (Signed)
Molluscum Contagiosum, Adult Molluscum contagiosum is a skin infection that can cause a rash. When molluscum contagiosum affects the genital area, it is called a sexually transmitted disease (STD). What are the causes? Molluscum contagiosum is caused by a virus. The virus can spread easily from person to person through:  Skin-to-skin contact with an infected person.  Contact with an infected object, such as a towel or clothing.  What increases the risk? You may be at higher risk for molluscum contagiosum if you:  Live in an area where the weather is moist and warm.  Have a weak body defense system (immune system).  What are the signs or symptoms? The main symptom is a rash that appears 2-7 weeks after exposure to the virus. It is made up of 2-20 small, firm, dome-shaped bumps that may:  Be pink or flesh-colored.  Appear alone or in groups.  Range from the size of a pinhead to the size of a pencil eraser.  Feel smooth and waxy.  Have a pit in the middle.  Itch. The rash does not itch for most people.  The bumps often appears on the genitals, thighs, face, neck, and abdomen. How is this diagnosed? A health care provider can usually diagnose molluscum contagiosum by looking at the bumps on your skin. To confirm the diagnosis, your health care provider may scrape the bumps to collect a skin sample to examine under a microscope. How is this treated? The bumps may go away on their own, but people often have treatment to keep the virus from infecting someone else or to keep the rash from spreading to other body parts. Treatment may include:  Surgery to remove the bumps by freezing them (cryosurgery).  A procedure to scrape off the bumps (curettage).  A procedure to remove the bumps with a laser.  Putting medicine on the bumps (topical treatment).  Sometimes no treatment is needed. Follow these instructions at home:  Take medicines only as directed by your health care  provider.  As long as you have bumps on your skin, the infection can spread to others and to other parts of your body. To prevent this from happening: ? Do not scratch the bumps. ? Do not share clothing or towels with others until the bumps disappear. ? Avoid close contact with others until the bumps disappear. This includes sexual contact. ? Wash your hands often. ? Cover the bumps with clothing or a bandage when you will be near other people. Contact a health care provider if:  The bumps are spreading.  The bumps are becoming red and sore.  The bumps have not gone away after 12 months. This information is not intended to replace advice given to you by your health care provider. Make sure you discuss any questions you have with your health care provider. Document Released: 12/26/2013 Document Revised: 11/06/2015 Document Reviewed: 11/07/2013 Elsevier Interactive Patient Education  2018 Elsevier Inc.  

## 2017-01-31 NOTE — Telephone Encounter (Signed)
Patient seen in the emergency room here at [facility] on Thursday for vaginal bleeding and the ultrasound suggested some clots in the lower uterine segment somewhat which had some FloSeal that may represent some cotyledon of tissue remnants from the placenta. Ring forceps were used in no tissue could be extracted, only a small amounts of thin clots and old blood. Patient called to counter as she is taking Methergine. She reports continued bleeding that's heavier than she was expected. Patient advised to make an appointment early this week for reassessment patient states she'll call back later today

## 2017-02-16 ENCOUNTER — Encounter: Payer: Self-pay | Admitting: Obstetrics and Gynecology

## 2017-02-16 ENCOUNTER — Ambulatory Visit (INDEPENDENT_AMBULATORY_CARE_PROVIDER_SITE_OTHER): Payer: Medicaid Other | Admitting: Obstetrics and Gynecology

## 2017-02-16 VITALS — BP 110/60 | HR 76 | Wt 237.2 lb

## 2017-02-16 DIAGNOSIS — L918 Other hypertrophic disorders of the skin: Secondary | ICD-10-CM

## 2017-02-16 DIAGNOSIS — B081 Molluscum contagiosum: Secondary | ICD-10-CM

## 2017-02-16 NOTE — Progress Notes (Signed)
Patient ID: [name], female   DOB: Mar 18, 1996, 21 y.o.   MRN: [id number] She notes that she is in the office for multiple vulvar skin tags that she would like removed. Pt has not tried any medications for her symptoms. She denies any other symptoms.   SKIN TAG REMOVAL PROCEDURE NOTE The patient's identification was confirmed and consent was obtained. This procedure was performed by Tilda BurrowJohn V Masud Holub at 12:45 PM Site & Appearance: from inner thighs bilaterally, perineum, and buttock Sterile procedures observed: Yes Anesthetic used: 1% lidocaine without epinephrine. AgNO3 applied: Yes to all 15-20 excised positions  15-20 skin tags excised under local anesthesia, site covered with dry, sterile dressing. Patient tolerated procedure well without complications. Minimal blood loss. Instructions for care discussed verbally and patient provided with additional written instructions for homecare and f/u.    A: 1. Molluscum contagiosum /skin tags multiple  P: 1. Follow up for postpartum check as scheduled   By signing my name below, I, [name], attest that this documentation has been prepared under the direction and in the presence of Tilda BurrowFerguson, Annaleia Pence V, MD. Electronically Signed: [name], ED Scribe. 02/16/17. 12:40 PM.  I personally performed the services described in this documentation, which was SCRIBED in my presence. The recorded information has been reviewed and considered accurate. It has been edited as necessary during review. Tilda BurrowFERGUSON,Takenya Travaglini V, MD

## 2017-02-18 DIAGNOSIS — L918 Other hypertrophic disorders of the skin: Secondary | ICD-10-CM | POA: Insufficient documentation

## 2017-02-28 ENCOUNTER — Ambulatory Visit: Payer: Medicaid Other | Admitting: Women's Health

## 2017-03-01 NOTE — Progress Notes (Signed)
Please note the number of sites biopsied.

## 2017-03-01 NOTE — Progress Notes (Signed)
That's fine

## 2017-03-08 ENCOUNTER — Encounter: Payer: Self-pay | Admitting: Advanced Practice Midwife

## 2017-03-08 ENCOUNTER — Ambulatory Visit (INDEPENDENT_AMBULATORY_CARE_PROVIDER_SITE_OTHER): Payer: Medicaid Other | Admitting: Advanced Practice Midwife

## 2017-03-08 DIAGNOSIS — Z1159 Encounter for screening for other viral diseases: Secondary | ICD-10-CM

## 2017-03-08 DIAGNOSIS — Z118 Encounter for screening for other infectious and parasitic diseases: Secondary | ICD-10-CM

## 2017-03-08 MED ORDER — AZITHROMYCIN 500 MG PO TABS: 1000.0000 mg | ORAL_TABLET | Freq: Once | ORAL | 0 refills | Status: AC

## 2017-03-08 MED ORDER — MEDROXYPROGESTERONE ACETATE 150 MG/ML IM SUSP: 150.0000 mg | INTRAMUSCULAR | 3 refills | Status: DC

## 2017-03-08 MED ORDER — AMOXICILLIN-POT CLAVULANATE 875-125 MG PO TABS: 1.0000 | ORAL_TABLET | Freq: Two times a day (BID) | ORAL | 0 refills | Status: DC

## 2017-03-08 MED ORDER — MISOPROSTOL 200 MCG PO TABS: 200.0000 ug | ORAL_TABLET | Freq: Two times a day (BID) | ORAL | 0 refills | Status: DC

## 2017-03-08 NOTE — Progress Notes (Signed)
[name] is a 21 y.o. who presents for a postpartum visit. She is 6 weeks postpartum following a spontaneous vaginal delivery. I have fully reviewed the prenatal and intrapartum course. The delivery was at 39.1 gestational weeks. IOL for CHTN vs GHTN.   Anesthesia: epidural. Postpartum course has been complicated by uterine subinvolution 2/2 RPOC, ony needed cytotec to resolve. Bleeding is less, but still "like a period".  Also has nearly constant lower abdominal pain.  Had multiple skin tags/molluscum contagiosum removed 2 weeks ago  Sites look fine today. . Baby's course has been uneventful. Baby is feeding by bottle. Bleeding: SSE:  Small to moderate amount dark red blood.  Difficult to asses uterine size d/t habitus,  Cx closed. Bowel function is normal. Bladder function is normal. Patient is sexually active. Contraception method is none. Postpartum depression screening: equivocal at 14.  Pt's boyfriend has been cheating on her for mo nths, just told her his girlfriend has chlamydia.   Therapy referral offered and accepted. .   Current Outpatient Prescriptions:  .  ibuprofen (ADVIL,MOTRIN) 600 MG tablet, Take 1 tablet (600 mg total) by mouth every 6 (six) hours., Disp: 30 tablet, Rfl: 0 .  acyclovir (ZOVIRAX) 400 MG tablet, Take 1 tablet (400 mg total) by mouth 3 (three) times daily. (Patient not taking: Reported on 02/16/2017), Disp: 90 tablet, Rfl: 2 .  escitalopram (LEXAPRO) 10 MG tablet, Take 1 tablet (10 mg total) by mouth daily. (Patient not taking: Reported on 02/16/2017), Disp: 30 tablet, Rfl: 6 .  HYDROcodone-acetaminophen (NORCO/VICODIN) 5-325 MG tablet, Take 2 tablets by mouth every 6 (six) hours as needed. (Patient not taking: Reported on 02/16/2017), Disp: 10 tablet, Rfl: 0 .  misoprostol (CYTOTEC) 200 MCG tablet, Take 1 tablet (200 mcg total) by mouth 2 (two) times daily. (Patient not taking: Reported on 02/16/2017), Disp: 10 tablet, Rfl: 0  Review of Systems   Constitutional: Negative  for fever and chills Eyes: Negative for visual disturbances Respiratory: Negative for shortness of breath, dyspnea Cardiovascular: Negative for chest pain or palpitations  Gastrointestinal: Negative for vomiting, diarrhea and constipation Genitourinary: Negative for dysuria and urgency Musculoskeletal: Negative for back pain, joint pain, myalgias  Neurological: Negative for dizziness and headaches    Objective:     Vitals:   03/08/17 1105  BP: 110/70  Pulse: 87   General:  alert, cooperative and no distress   Breasts:  negative  Lungs: clear to auscultation bilaterally  Heart:  regular rate and rhythm  Abdomen: Soft, nontender   Vulva:  normal  Vagina: normal vagina  Cervix:  closed  Corpus: Well involuted     Rectal Exam: no hemorrhoids        Assessment:     postpartum exam. Prolonged bleeding Uterine tenderness/endometritis/chlamydia exposure  Plan:   1. Contraception: Depo-Provera injections rx sent, come back later for injection 2. Follow up in:  1-2 weeks for colpo/pain asssessment  [facility] 3.  Azithromycin for chlamydia contact.  Augmetin/cytotec for endometritis/prolonged bleeding

## 2017-03-08 NOTE — Addendum Note (Signed)
Addended by: Moss Mc on: 03/08/2017 12:16 PM   Modules accepted: Orders

## 2017-03-10 ENCOUNTER — Ambulatory Visit (INDEPENDENT_AMBULATORY_CARE_PROVIDER_SITE_OTHER): Payer: Medicaid Other | Admitting: *Deleted

## 2017-03-10 ENCOUNTER — Encounter: Payer: Self-pay | Admitting: *Deleted

## 2017-03-10 DIAGNOSIS — Z3042 Encounter for surveillance of injectable contraceptive: Secondary | ICD-10-CM | POA: Diagnosis not present

## 2017-03-10 DIAGNOSIS — Z3202 Encounter for pregnancy test, result negative: Secondary | ICD-10-CM

## 2017-03-10 DIAGNOSIS — Z308 Encounter for other contraceptive management: Secondary | ICD-10-CM

## 2017-03-10 LAB — POCT URINE PREGNANCY: PREG TEST UR: NEGATIVE

## 2017-03-10 LAB — GC/CHLAMYDIA PROBE AMP
Chlamydia trachomatis, NAA: NEGATIVE
Neisseria gonorrhoeae by PCR: NEGATIVE

## 2017-03-10 MED ORDER — MEDROXYPROGESTERONE ACETATE 150 MG/ML IM SUSP
150.0000 mg | Freq: Once | INTRAMUSCULAR | Status: AC
  Administered 2017-03-10: 150 mg via INTRAMUSCULAR

## 2017-03-10 NOTE — Progress Notes (Signed)
Pt here for Depo. Pt tolerated shot well. Return in 12 weeks for next shot. JSY 

## 2017-03-23 ENCOUNTER — Other Ambulatory Visit: Payer: Self-pay | Admitting: Advanced Practice Midwife

## 2017-03-23 ENCOUNTER — Encounter: Payer: Self-pay | Admitting: Advanced Practice Midwife

## 2017-03-23 ENCOUNTER — Encounter: Payer: Medicaid Other | Admitting: Obstetrics and Gynecology

## 2017-03-23 ENCOUNTER — Telehealth: Payer: Self-pay | Admitting: *Deleted

## 2017-03-23 MED ORDER — MEGESTROL ACETATE 40 MG PO TABS: ORAL_TABLET | ORAL | 3 refills | Status: DC

## 2017-03-23 NOTE — Progress Notes (Signed)
Got depo 9/25, never took 2nd round of cytotec, but was really just an effort to cover all bases.  At this [oint, megace is more appropriate

## 2017-03-23 NOTE — Telephone Encounter (Addendum)
Patient states she is still bleeding but it's not heavier. She has not taken cytotec that was prescribed on 03/08/17 because she could not find it at any pharmacy in town. Please advise. Pt has mychart acct active if you want to send message regarding medication.

## 2017-04-11 ENCOUNTER — Telehealth: Payer: Self-pay | Admitting: Advanced Practice Midwife

## 2017-04-11 NOTE — Telephone Encounter (Signed)
Informed [facility] is now closed. Will [facility]. Verbalized understanding.

## 2017-06-02 ENCOUNTER — Ambulatory Visit: Payer: Medicaid Other

## 2017-06-02 ENCOUNTER — Encounter: Payer: Self-pay | Admitting: *Deleted

## 2017-06-15 ENCOUNTER — Other Ambulatory Visit: Payer: Medicaid Other

## 2017-06-15 ENCOUNTER — Ambulatory Visit: Payer: Medicaid Other

## 2017-06-16 ENCOUNTER — Ambulatory Visit (INDEPENDENT_AMBULATORY_CARE_PROVIDER_SITE_OTHER): Payer: Medicaid Other

## 2017-06-16 ENCOUNTER — Other Ambulatory Visit: Payer: Medicaid Other

## 2017-06-16 VITALS — Wt 252.2 lb

## 2017-06-16 DIAGNOSIS — Z3042 Encounter for surveillance of injectable contraceptive: Secondary | ICD-10-CM | POA: Diagnosis not present

## 2017-06-16 LAB — BETA HCG QUANT (REF LAB): hCG Quant: <1 m[IU]/mL

## 2017-06-16 MED ORDER — MEDROXYPROGESTERONE ACETATE 150 MG/ML IM SUSP
150.0000 mg | Freq: Once | INTRAMUSCULAR | Status: AC
  Administered 2017-06-16: 150 mg via INTRAMUSCULAR

## 2017-06-16 NOTE — Progress Notes (Signed)
Pt here for depo shot 150 mg IM given lt deltoid. Tolerated well. Return 12 weeks for next shot. Had Serum HCG done negative. Pad CMA

## 2017-06-29 ENCOUNTER — Encounter: Payer: Self-pay | Admitting: Obstetrics and Gynecology

## 2017-06-29 ENCOUNTER — Other Ambulatory Visit (HOSPITAL_COMMUNITY)
Admission: RE | Admit: 2017-06-29 | Discharge: 2017-06-29 | Disposition: A | Discharge status: Home / Self Care | Payer: Medicaid Other | Source: Ambulatory Visit | Attending: Obstetrics and Gynecology | Admitting: Obstetrics and Gynecology

## 2017-06-29 ENCOUNTER — Ambulatory Visit (INDEPENDENT_AMBULATORY_CARE_PROVIDER_SITE_OTHER): Payer: Medicaid Other | Admitting: Obstetrics and Gynecology

## 2017-06-29 ENCOUNTER — Other Ambulatory Visit: Payer: Self-pay

## 2017-06-29 VITALS — BP 124/78 | HR 100 | Ht 66.0 in | Wt 251.0 lb

## 2017-06-29 DIAGNOSIS — R87810 Cervical high risk human papillomavirus (HPV) DNA test positive: Secondary | ICD-10-CM | POA: Diagnosis not present

## 2017-06-29 DIAGNOSIS — Z87898 Personal history of other specified conditions: Secondary | ICD-10-CM

## 2017-06-29 DIAGNOSIS — N939 Abnormal uterine and vaginal bleeding, unspecified: Secondary | ICD-10-CM

## 2017-06-29 DIAGNOSIS — Z309 Encounter for contraceptive management, unspecified: Secondary | ICD-10-CM

## 2017-06-29 DIAGNOSIS — Z8742 Personal history of other diseases of the female genital tract: Secondary | ICD-10-CM

## 2017-06-29 DIAGNOSIS — R635 Abnormal weight gain: Secondary | ICD-10-CM | POA: Diagnosis not present

## 2017-06-29 DIAGNOSIS — R87611 Atypical squamous cells cannot exclude high grade squamous intraepithelial lesion on cytologic smear of cervix (ASC-H): Secondary | ICD-10-CM

## 2017-06-29 NOTE — Progress Notes (Signed)
Patient ID: BRINLYNN GORTON, female   DOB: 05-18-1996, 22 y.o.   MRN: [id number]   [facility] Visit  @DATE @            Patient name: CHERISE FEDDER MRN [id number]  Date of birth: 03-08-1996  CC & HPI:  DELINDA MALAN is a 22 y.o. female presenting today for a repeat PAP. She had an abnormal pap on 07/30/2016. She is currently on the depo shot but notes that she has been experiencing occasional bleeding. She also reports gaining weight while on the depo shot. She states that when she first started being sexually active she started on the depo shot. Prior to her last pregnancy she had an IUD in place, but it fell out. She denies fever, chills or any other symptoms or complaints at this time.   ROS:  ROS +weight gain -fever -chills All systems are negative except as noted in the HPI and PMH.   Pertinent History Reviewed:   Reviewed: Significant for abnormal PAP (HPV) Medical         Past Medical History:  Diagnosis Date  . Anxiety   . Asthma    childhood  . Depression    post partum  . Dyspnea   . Headache   . Hypertension   . Irregular menstrual bleeding 01/31/2014  . Recurrent upper respiratory infection (URI)    Bronchitis  . Vaginal Pap smear, abnormal    HPV                              Surgical Hx:    Past Surgical History:  Procedure Laterality Date  . CHOLECYSTECTOMY    . CYST REMOVAL NECK    . TYMPANOSTOMY TUBE PLACEMENT    . TYMPANOSTOMY TUBE PLACEMENT     Medications: Reviewed & Updated - see associated section                       Current Outpatient Medications:  .  acyclovir (ZOVIRAX) 400 MG tablet, Take 1 tablet (400 mg total) by mouth 3 (three) times daily. (Patient taking differently: Take 400 mg by mouth 3 (three) times daily as needed. ), Disp: 90 tablet, Rfl: 2 .  amoxicillin-clavulanate (AUGMENTIN) 875-125 MG tablet, Take 1 tablet by mouth 2 (two) times daily. (Patient not taking: Reported on 06/16/2017), Disp: 14 tablet, Rfl: 0 .  ibuprofen  (ADVIL,MOTRIN) 600 MG tablet, Take 1 tablet (600 mg total) by mouth every 6 (six) hours. (Patient not taking: Reported on 06/16/2017), Disp: 30 tablet, Rfl: 0 .  medroxyPROGESTERone (DEPO-PROVERA) 150 MG/ML injection, Inject 1 mL (150 mg total) into the muscle every 3 (three) months., Disp: 1 mL, Rfl: 3 .  megestrol (MEGACE) 40 MG tablet, Take 3/day (at the same time) for 5 days; 2/day for 5 days, then 1/day PO prn bleeding (Patient not taking: Reported on 06/16/2017), Disp: 60 tablet, Rfl: 3   Social History: Reviewed -  reports that she has been smoking cigarettes.  She has a 1.00 pack-year smoking history. she has never used smokeless tobacco.  Objective Findings:  Vitals: Blood pressure 124/78, pulse 100, height 5\' 6"  (1.676 m), weight 251 lb (113.9 kg), last menstrual period 06/28/2017, not currently breastfeeding.  PHYSICAL EXAMINATION General appearance - alert, well appearing, and in no distress, oriented to person, place, and time and overweight Mental status - alert, oriented to person, place, and time, normal mood,  behavior, speech, dress, motor activity, and thought processes, affect appropriate to mood  PELVIC External genitalia -dark discolored, comedone  right labia majora unable to evacuate vulva -normal female Vagina -normal Cervix -multiparous, light blood present} Uterus -mobile nontender} Adnexa -nontender   Assessment & Plan:   A:  1.  Repeat Pap one year after ASCUS with positive HPV with noncompliant follow-up 2.  Contraception counseling patient desires to switch birth control pill in 3 months from Depo-Provera to another method that will not cause her to gain weight given extensive options today  P:  1. F/u 3 months contraception discussion to continue   By signing my name below, I, Diona BrownerJennifer Gorman, attest that this documentation has been prepared under the direction and in the presence of Tilda BurrowFerguson, Abou Sterkel V, MD. Electronically Signed: Diona BrownerJennifer Gorman, Medical  Scribe. 06/29/17. 11:17 AM.  I personally performed the services described in this documentation, which was SCRIBED in my presence. The recorded information has been reviewed and considered accurate. It has been edited as necessary during review. Tilda BurrowJohn V Canna Nickelson, MD

## 2017-07-07 ENCOUNTER — Telehealth: Payer: Self-pay | Admitting: *Deleted

## 2017-07-07 LAB — CYTOLOGY - PAP
CHLAMYDIA, DNA PROBE: NEGATIVE
HPV: DETECTED — AB
NEISSERIA GONORRHEA: NEGATIVE

## 2017-07-07 NOTE — Telephone Encounter (Signed)
Informed patient of abnormal pap smear and need for colposcopy.  All questions answered and will get scheduled.

## 2017-07-18 ENCOUNTER — Encounter: Payer: Medicaid Other | Admitting: Obstetrics and Gynecology

## 2017-07-27 ENCOUNTER — Other Ambulatory Visit: Payer: Self-pay

## 2017-07-27 ENCOUNTER — Ambulatory Visit (INDEPENDENT_AMBULATORY_CARE_PROVIDER_SITE_OTHER): Payer: Medicaid Other | Admitting: Obstetrics and Gynecology

## 2017-07-27 ENCOUNTER — Encounter: Payer: Self-pay | Admitting: Obstetrics and Gynecology

## 2017-07-27 ENCOUNTER — Other Ambulatory Visit: Payer: Self-pay | Admitting: Obstetrics and Gynecology

## 2017-07-27 VITALS — BP 134/82 | HR 98 | Ht 67.0 in | Wt 255.0 lb

## 2017-07-27 DIAGNOSIS — Z3202 Encounter for pregnancy test, result negative: Secondary | ICD-10-CM | POA: Diagnosis not present

## 2017-07-27 DIAGNOSIS — R87611 Atypical squamous cells cannot exclude high grade squamous intraepithelial lesion on cytologic smear of cervix (ASC-H): Secondary | ICD-10-CM | POA: Diagnosis not present

## 2017-07-27 LAB — POCT URINE PREGNANCY: Preg Test, Ur: NEGATIVE

## 2017-07-27 NOTE — Addendum Note (Signed)
Addended by: Tish FredericksonLANCASTER, Correll Denbow A on: 07/27/2017 03:33 PM   Modules accepted: Orders

## 2017-07-27 NOTE — Progress Notes (Signed)
  [name] 22 y.o. [id number] here for colposcopy for ASC cannot exclude high grade lesion [facility]) pap smear on 06/29/17.  ROS She is concerned about three to four small areas on her skin, little spots, 5-8 mm areas where the tissue appears to have pulled apart, circular atrophic tissue, that is non-tender, but burn from time to time. She has had them since November 2017 .  Discussed role for HPV in cervical dysplasia, need for surveillance.  Patient given informed consent, signed copy in the chart, time out was performed.  Placed in lithotomy position. Cervix viewed with speculum and colposcope after application of acetic acid.   Colposcopy adequate? Yes Biopsies obtained at 6:00, 8:00, and 9:00 ECC specimen obtained. All specimens were labelled and sent to pathology.   Colposcopy IMPRESSION: Squamous metaplasia, relapsed CIN Skin lesions appear to be some sort of apoptosis, as if tissue atrophied in a 5 -10 mm area on skin  Patient was given post procedure instructions. Will follow up pathology and manage accordingly.  Routine preventative health maintenance measures emphasized. Patient is instructed to take photos of the skin lesions and see a dermatologist   By signing my name below, I, Izna Ahmed, attest that this documentation has been prepared under the direction and in the presence of Tilda BurrowFerguson, Drystan Reader V, MD. Electronically Signed: Redge GainerIzna Ahmed, Medical Scribe. 07/27/17. 2:20 PM.  I personally performed the services described in this documentation, which was SCRIBED in my presence. The recorded information has been reviewed and considered accurate. It has been edited as necessary during review. Tilda BurrowJohn V Mykal Kirchman, MD

## 2017-08-02 ENCOUNTER — Telehealth: Payer: Self-pay | Admitting: Obstetrics and Gynecology

## 2017-08-02 NOTE — Telephone Encounter (Signed)
Colposcopy showed CIN-2, moderate dysplasia.  I have reviewed the ASC CP guidelines which indicated her young age that it aggressive follow-up in 6 months and 12 months with colposcopic evaluation and biopsies is indicated.  The patient is interested in following up with close follow-up.  We will plan to repeat colposcopy in August or September and then again in 1 year

## 2017-09-08 ENCOUNTER — Ambulatory Visit: Payer: Medicaid Other

## 2017-09-12 ENCOUNTER — Ambulatory Visit (INDEPENDENT_AMBULATORY_CARE_PROVIDER_SITE_OTHER): Payer: Medicaid Other

## 2017-09-12 VITALS — Wt 260.0 lb

## 2017-09-12 DIAGNOSIS — Z3202 Encounter for pregnancy test, result negative: Secondary | ICD-10-CM | POA: Diagnosis not present

## 2017-09-12 DIAGNOSIS — Z3042 Encounter for surveillance of injectable contraceptive: Secondary | ICD-10-CM | POA: Diagnosis not present

## 2017-09-12 LAB — POCT URINE PREGNANCY: PREG TEST UR: NEGATIVE

## 2017-09-12 MED ORDER — MEDROXYPROGESTERONE ACETATE 150 MG/ML IM SUSP
150.0000 mg | Freq: Once | INTRAMUSCULAR | Status: AC
  Administered 2017-09-12: 150 mg via INTRAMUSCULAR

## 2017-09-12 NOTE — Progress Notes (Signed)
Pt here for depo injection 150 mg IM given rt deltoid. Tolerated well. Return 12 weeks for next injection. Pad CMA 

## 2017-12-05 ENCOUNTER — Ambulatory Visit: Payer: Medicaid Other

## 2017-12-05 ENCOUNTER — Ambulatory Visit (INDEPENDENT_AMBULATORY_CARE_PROVIDER_SITE_OTHER): Payer: Managed Care, Other (non HMO) | Admitting: *Deleted

## 2017-12-05 ENCOUNTER — Encounter: Payer: Self-pay | Admitting: *Deleted

## 2017-12-05 DIAGNOSIS — Z3042 Encounter for surveillance of injectable contraceptive: Secondary | ICD-10-CM

## 2017-12-05 DIAGNOSIS — Z308 Encounter for other contraceptive management: Secondary | ICD-10-CM

## 2017-12-05 DIAGNOSIS — Z3202 Encounter for pregnancy test, result negative: Secondary | ICD-10-CM

## 2017-12-05 LAB — POCT URINE PREGNANCY: PREG TEST UR: NEGATIVE

## 2017-12-05 MED ORDER — MEDROXYPROGESTERONE ACETATE 150 MG/ML IM SUSP
150.0000 mg | Freq: Once | INTRAMUSCULAR | Status: AC
  Administered 2017-12-05: 150 mg via INTRAMUSCULAR

## 2017-12-05 NOTE — Progress Notes (Signed)
Pt here for Depo. Pt received shot in left deltoid. Pt tolerated shot well. Return in 12 weeks for next shot. JSY 

## 2018-01-26 ENCOUNTER — Encounter: Payer: Self-pay | Admitting: Obstetrics and Gynecology

## 2018-02-27 ENCOUNTER — Other Ambulatory Visit: Payer: Self-pay | Admitting: Advanced Practice Midwife

## 2018-03-01 ENCOUNTER — Encounter: Payer: Self-pay | Admitting: Obstetrics and Gynecology

## 2018-03-01 ENCOUNTER — Other Ambulatory Visit: Payer: Self-pay | Admitting: Obstetrics and Gynecology

## 2018-03-01 ENCOUNTER — Ambulatory Visit (INDEPENDENT_AMBULATORY_CARE_PROVIDER_SITE_OTHER): Payer: Managed Care, Other (non HMO) | Admitting: Obstetrics and Gynecology

## 2018-03-01 ENCOUNTER — Other Ambulatory Visit: Payer: Self-pay

## 2018-03-01 VITALS — BP 122/82 | HR 96 | Ht 67.0 in | Wt 254.8 lb

## 2018-03-01 DIAGNOSIS — Z3202 Encounter for pregnancy test, result negative: Secondary | ICD-10-CM

## 2018-03-01 DIAGNOSIS — N871 Moderate cervical dysplasia: Secondary | ICD-10-CM

## 2018-03-01 LAB — POCT URINE PREGNANCY: PREG TEST UR: NEGATIVE

## 2018-03-01 NOTE — Progress Notes (Signed)
  [name] 22 y.o. [id number] here for colposcopy for ASC cannot exclude high grade lesion [facility] pap smear on 06/29/2017.  Discussed role for HPV in cervical dysplasia, need for surveillance.  Patient given informed consent, signed copy in the chart, time out was performed.  Placed in lithotomy position. Cervix viewed with speculum and colposcope after application of acetic acid.   Colposcopy adequate? Yes  acetowhite lesion(s) with some mosaicism noted at 12 o'clock on a multiparous cervix with eversion.;  3 small  biopsies obtained at 12, .90%of lesion removed.  ECC specimen obtained.yes All specimens were labelled and sent to pathology.  Monsels applies due to generous bleeding from biopsies Colposcopy IMPRESSION: CIN I-II  Patient was given post procedure instructions. Will follow up pathology and manage accordingly.  Routine preventative health maintenance measures emphasized.

## 2018-03-02 ENCOUNTER — Encounter: Payer: Managed Care, Other (non HMO) | Admitting: Obstetrics and Gynecology

## 2018-03-09 ENCOUNTER — Ambulatory Visit: Payer: Managed Care, Other (non HMO)

## 2018-03-10 ENCOUNTER — Ambulatory Visit (INDEPENDENT_AMBULATORY_CARE_PROVIDER_SITE_OTHER): Payer: Managed Care, Other (non HMO) | Admitting: *Deleted

## 2018-03-10 ENCOUNTER — Encounter: Payer: Self-pay | Admitting: *Deleted

## 2018-03-10 VITALS — Wt 255.0 lb

## 2018-03-10 DIAGNOSIS — Z3202 Encounter for pregnancy test, result negative: Secondary | ICD-10-CM | POA: Diagnosis not present

## 2018-03-10 DIAGNOSIS — Z3042 Encounter for surveillance of injectable contraceptive: Secondary | ICD-10-CM

## 2018-03-10 LAB — POCT URINE PREGNANCY: Preg Test, Ur: NEGATIVE

## 2018-03-10 MED ORDER — MEDROXYPROGESTERONE ACETATE 150 MG/ML IM SUSP
150.0000 mg | Freq: Once | INTRAMUSCULAR | Status: AC
  Administered 2018-03-10: 150 mg via INTRAMUSCULAR

## 2018-03-10 NOTE — Progress Notes (Signed)
Patient 4 days late for getting Depo. Last intercourse 2 days ago with no protection.  Ok to give per JAG. Depo Provera 150mg  given in right deltoid with no complications.  Pt to return in 12 weeks for next injection.
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
## 2018-03-30 ENCOUNTER — Ambulatory Visit (INDEPENDENT_AMBULATORY_CARE_PROVIDER_SITE_OTHER): Payer: 59 | Admitting: Psychiatry

## 2018-03-30 VITALS — BP 122/83 | HR 86 | Ht 67.0 in | Wt 254.0 lb

## 2018-03-30 DIAGNOSIS — F331 Major depressive disorder, recurrent, moderate: Secondary | ICD-10-CM

## 2018-03-30 DIAGNOSIS — F4310 Post-traumatic stress disorder, unspecified: Secondary | ICD-10-CM | POA: Diagnosis not present

## 2018-03-30 MED ORDER — SERTRALINE HCL 50 MG PO TABS: ORAL_TABLET | ORAL | 0 refills | Status: DC

## 2018-03-30 NOTE — Patient Instructions (Addendum)
1. Start sertraline 25 mg for one week, then 50 mg daily  2. Return to clinic in one month for 30 mins 3. Check blood test 4. CONTACT INFORMATION  What to do if you need to get in touch with someone regarding a psychiatric issue:  1. EMERGENCY: For psychiatric emergencies (if you are suicidal or if there are any other safety issues) call 911 and/or go to your nearest Emergency Room immediately.   2. IF YOU NEED SOMEONE TO TALK TO RIGHT NOW: Given my clinical responsibilities, I may not be able to speak with you over the phone for a prolonged period of time.  a. You may always call The National Suicide Prevention Lifeline at 1-800-273-TALK 872-803-9994).  b. Your county of residence will also have local crisis services. For [facility]: [facility] at 801-362-8658 (24 Hour Crisis Hotline)

## 2018-03-31 LAB — CBC
HCT: 40.3 % (ref 35.0–45.0)
Hemoglobin: 14.2 g/dL (ref 11.7–15.5)
MCH: 30.9 pg (ref 27.0–33.0)
MCHC: 35.2 g/dL (ref 32.0–36.0)
MCV: 87.6 fL (ref 80.0–100.0)
MPV: 11.3 fL (ref 7.5–12.5)
PLATELETS: 256 10*3/uL (ref 140–400)
RBC: 4.6 10*6/uL (ref 3.80–5.10)
RDW: 12.7 % (ref 11.0–15.0)
WBC: 9.9 10*3/uL (ref 3.8–10.8)

## 2018-03-31 LAB — TSH: TSH: 0.97 m[IU]/L

## 2018-04-11 ENCOUNTER — Telehealth (HOSPITAL_COMMUNITY): Payer: Self-pay | Admitting: *Deleted

## 2018-04-11 ENCOUNTER — Other Ambulatory Visit (HOSPITAL_COMMUNITY): Payer: Self-pay | Admitting: Psychiatry

## 2018-04-11 MED ORDER — SERTRALINE HCL 50 MG PO TABS: 50.0000 mg | ORAL_TABLET | Freq: Every day | ORAL | 0 refills | Status: DC

## 2018-04-11 NOTE — Telephone Encounter (Signed)
ordered

## 2018-04-11 NOTE — Telephone Encounter (Signed)
It is difficult to tell if the medication is not effective yet as it would take at least four weeks to be fully effective. If she hopes to switch to other medication, advise her to take sertraline 25 mg daily for one week then discontinue to avoid side effect. Advise her to come back sooner for 30 mins appointment. Also please discuss emergency resources as needed

## 2018-04-11 NOTE — Telephone Encounter (Signed)
Dr Latanya Presser Office Took VM early patient needed a refill. After speaking with patient to inform refill sent in " she stated the Zoloft is making her feel bad" That she hasn't been out of the bed for 2 days "not even to go to work or anything"

## 2018-04-11 NOTE — Telephone Encounter (Signed)
Dr Vanetta Shawl Patient left VM @ front office requesting appointment  & refill . Next appointment is 05/04/18

## 2018-04-15 ENCOUNTER — Emergency Department (HOSPITAL_COMMUNITY)
Admission: EM | Admit: 2018-04-15 | Discharge: 2018-04-15 | Disposition: A | Discharge status: Home / Self Care | Payer: Managed Care, Other (non HMO) | Attending: Emergency Medicine | Admitting: Emergency Medicine

## 2018-04-15 ENCOUNTER — Other Ambulatory Visit: Payer: Self-pay

## 2018-04-15 ENCOUNTER — Encounter (HOSPITAL_COMMUNITY): Payer: Self-pay

## 2018-04-15 DIAGNOSIS — N3000 Acute cystitis without hematuria: Secondary | ICD-10-CM | POA: Insufficient documentation

## 2018-04-15 DIAGNOSIS — J45909 Unspecified asthma, uncomplicated: Secondary | ICD-10-CM | POA: Diagnosis not present

## 2018-04-15 DIAGNOSIS — M791 Myalgia, unspecified site: Secondary | ICD-10-CM

## 2018-04-15 DIAGNOSIS — R531 Weakness: Secondary | ICD-10-CM | POA: Diagnosis present

## 2018-04-15 DIAGNOSIS — F17210 Nicotine dependence, cigarettes, uncomplicated: Secondary | ICD-10-CM | POA: Diagnosis not present

## 2018-04-15 DIAGNOSIS — I10 Essential (primary) hypertension: Secondary | ICD-10-CM | POA: Insufficient documentation

## 2018-04-15 LAB — URINALYSIS, ROUTINE W REFLEX MICROSCOPIC
BILIRUBIN URINE: NEGATIVE
GLUCOSE, UA: NEGATIVE mg/dL
HGB URINE DIPSTICK: NEGATIVE
KETONES UR: NEGATIVE mg/dL
NITRITE: NEGATIVE
PH: 6 (ref 5.0–8.0)
Protein, ur: NEGATIVE mg/dL
Specific Gravity, Urine: 1.008 (ref 1.005–1.030)

## 2018-04-15 LAB — COMPREHENSIVE METABOLIC PANEL
ALT: 20 U/L (ref 0–44)
ANION GAP: 8 (ref 5–15)
AST: 20 U/L (ref 15–41)
Albumin: 3.9 g/dL (ref 3.5–5.0)
Alkaline Phosphatase: 80 U/L (ref 38–126)
BILIRUBIN TOTAL: 0.9 mg/dL (ref 0.3–1.2)
BUN: 10 mg/dL (ref 6–20)
CHLORIDE: 110 mmol/L (ref 98–111)
CO2: 19 mmol/L — ABNORMAL LOW (ref 22–32)
Calcium: 8.7 mg/dL — ABNORMAL LOW (ref 8.9–10.3)
Creatinine, Ser: 0.66 mg/dL (ref 0.44–1.00)
Glucose, Bld: 105 mg/dL — ABNORMAL HIGH (ref 70–99)
POTASSIUM: 3.7 mmol/L (ref 3.5–5.1)
Sodium: 137 mmol/L (ref 135–145)
TOTAL PROTEIN: 7.5 g/dL (ref 6.5–8.1)

## 2018-04-15 LAB — CBC WITH DIFFERENTIAL/PLATELET
Abs Immature Granulocytes: 0.02 10*3/uL (ref 0.00–0.07)
BASOS ABS: 0 10*3/uL (ref 0.0–0.1)
BASOS PCT: 0 %
EOS ABS: 0.2 10*3/uL (ref 0.0–0.5)
EOS PCT: 2 %
HEMATOCRIT: 40.8 % (ref 36.0–46.0)
Hemoglobin: 13.7 g/dL (ref 12.0–15.0)
Immature Granulocytes: 0 %
Lymphocytes Relative: 16 %
Lymphs Abs: 1.1 10*3/uL (ref 0.7–4.0)
MCH: 29.6 pg (ref 26.0–34.0)
MCHC: 33.6 g/dL (ref 30.0–36.0)
MCV: 88.1 fL (ref 80.0–100.0)
MONOS PCT: 11 %
Monocytes Absolute: 0.8 10*3/uL (ref 0.1–1.0)
NRBC: 0 % (ref 0.0–0.2)
Neutro Abs: 4.8 10*3/uL (ref 1.7–7.7)
Neutrophils Relative %: 71 %
PLATELETS: 189 10*3/uL (ref 150–400)
RBC: 4.63 MIL/uL (ref 3.87–5.11)
RDW: 12.5 % (ref 11.5–15.5)
WBC: 6.9 10*3/uL (ref 4.0–10.5)

## 2018-04-15 MED ORDER — ONDANSETRON 4 MG PO TBDP
4.0000 mg | ORAL_TABLET | Freq: Once | ORAL | Status: AC
  Administered 2018-04-15: 4 mg via ORAL
  Filled 2018-04-15: qty 1

## 2018-04-15 MED ORDER — SULFAMETHOXAZOLE-TRIMETHOPRIM 800-160 MG PO TABS: 1.0000 | ORAL_TABLET | Freq: Two times a day (BID) | ORAL | 0 refills | Status: AC

## 2018-04-15 NOTE — Discharge Instructions (Addendum)
Drink plenty of fluids Tylenol or Motrin for aches or fever.  Follow-up with your doctor in a few days to recheck your urine culture and to have your blood pressure rechecked

## 2018-04-15 NOTE — ED Provider Notes (Signed)
[facility] EMERGENCY DEPARTMENT Provider Note   CSN: [id number] Arrival date & time: 04/15/18  4098     History   Chief Complaint Chief Complaint  Patient presents with  . Generalized Body Aches    HPI [name] is a 22 y.o. female.  Patient complains of aching  all over and weakness.  She also has a sore throat.  The history is provided by the patient. No language interpreter was used.  Weakness  Primary symptoms include no focal weakness. This is a recurrent problem. The current episode started less than 1 hour ago. The problem has not changed since onset.There was no focality noted. There has been no fever. Pertinent negatives include no shortness of breath, no chest pain and no headaches. There were no medications administered prior to arrival. Associated medical issues do not include trauma.    Past Medical History:  Diagnosis Date  . Anxiety   . Asthma    childhood  . Depression    post partum  . Dyspnea   . Headache   . Hypertension   . Irregular menstrual bleeding 01/31/2014  . Recurrent upper respiratory infection (URI)    Bronchitis  . Vaginal Pap smear, abnormal    HPV    Patient Active Problem List   Diagnosis Date Noted  . PTSD (post-traumatic stress disorder) 03/30/2018  . MDD (major depressive disorder), recurrent episode, moderate (HCC) 03/30/2018  . Multiple acquired skin tags 02/18/2017  . Chronic hypertension in pregnancy 01/24/2017  . Chronic hypertension 01/02/2017  . Abnormal EKG 01/02/2017  . Tachycardia 01/02/2017  . BV (bacterial vaginosis) 10/13/2016  . Abnormal Pap smear of cervix 07/26/2016  . Rh negative state in antepartum period 07/20/2016  . Susceptible to varicella (non-immune), currently pregnant 07/20/2016  . Chlamydia 07/01/2016  . Borderline high blood pressure 07/01/2016  . History of depression 05/07/2013  . Smoker 05/07/2013    Past Surgical History:  Procedure Laterality Date  . CHOLECYSTECTOMY    . CYST  REMOVAL NECK    . TYMPANOSTOMY TUBE PLACEMENT    . TYMPANOSTOMY TUBE PLACEMENT       OB History    Gravida  2   Para  2   Term  2   Preterm      AB      Living  2     SAB      TAB      Ectopic      Multiple  0   Live Births  2            Home Medications    Prior to Admission medications   Medication Sig Start Date End Date Taking? Authorizing Provider  medroxyPROGESTERone Acetate 150 MG/ML SUSY INJECT (150 MG TOTAL)  SUSPENSION INTRAMUSCULARLY INTO THE MUSCLE EVERY 3 MONTHS. 02/28/18   Cresenzo-Dishmon, Scarlette Calico, CNM  sertraline (ZOLOFT) 50 MG tablet Take 1 tablet (50 mg total) by mouth daily. 04/11/18   Neysa Hotter, MD  sulfamethoxazole-trimethoprim (BACTRIM DS,SEPTRA DS) 800-160 MG tablet Take 1 tablet by mouth 2 (two) times daily for 7 days. 04/15/18 04/22/18  Bethann Berkshire, MD    Family History Family History  Problem Relation Age of Onset  . Bipolar disorder Maternal Grandmother   . Heart disease Maternal Grandfather   . Thyroid disease Paternal Grandmother   . Arthritis Mother   . Fibromyalgia Mother   . Diabetes Mother   . Cancer Mother        vaginal  . Birth defects  Father     Social History Social History   Tobacco Use  . Smoking status: Current Every Day Smoker    Packs/day: 1.00    Years: 4.00    Pack years: 4.00    Types: Cigarettes  . Smokeless tobacco: Never Used  Substance Use Topics  . Alcohol use: No    Comment: socially  . Drug use: No    Types: Marijuana    Comment: denies use 06/20/14     Allergies   Patient has no known allergies.   Review of Systems Review of Systems  Constitutional: Positive for fatigue. Negative for appetite change.       Myalgias  HENT: Negative for congestion, ear discharge and sinus pressure.        Sore throat  Eyes: Negative for discharge.  Respiratory: Negative for cough and shortness of breath.   Cardiovascular: Negative for chest pain.  Gastrointestinal: Negative for abdominal  pain and diarrhea.  Genitourinary: Negative for frequency and hematuria.  Musculoskeletal: Negative for back pain.  Skin: Negative for rash.  Neurological: Positive for weakness. Negative for focal weakness, seizures and headaches.  Psychiatric/Behavioral: Negative for hallucinations.     Physical Exam Updated Vital Signs BP (!) 141/93 (BP Location: Right Arm)   Pulse 88   Temp 97.8 F (36.6 C) (Oral)   Resp 16   Ht 5\' 7"  (1.702 m)   Wt 104.3 kg   SpO2 100%   BMI 36.02 kg/m   Physical Exam  Constitutional: She is oriented to person, place, and time. She appears well-developed.  HENT:  Head: Normocephalic.  Eyes: Conjunctivae and EOM are normal. No scleral icterus.  Neck: Neck supple. No thyromegaly present.  Cardiovascular: Normal rate and regular rhythm. Exam reveals no gallop and no friction rub.  No murmur heard. Pulmonary/Chest: No stridor. She has no wheezes. She has no rales. She exhibits no tenderness.  Abdominal: She exhibits no distension. There is no tenderness. There is no rebound.  Musculoskeletal: Normal range of motion. She exhibits no edema.  Lymphadenopathy:    She has no cervical adenopathy.  Neurological: She is oriented to person, place, and time. She exhibits normal muscle tone. Coordination normal.  Skin: No rash noted. No erythema.  Psychiatric: She has a normal mood and affect. Her behavior is normal.     ED Treatments / Results  Labs (all labs ordered are listed, but only abnormal results are displayed) Labs Reviewed  COMPREHENSIVE METABOLIC PANEL - Abnormal; Notable for the following components:      Result Value   CO2 19 (*)    Glucose, Bld 105 (*)    Calcium 8.7 (*)    All other components within normal limits  URINALYSIS, ROUTINE W REFLEX MICROSCOPIC - Abnormal; Notable for the following components:   Color, Urine STRAW (*)    Leukocytes, UA TRACE (*)    Bacteria, UA RARE (*)    All other components within normal limits  URINE  CULTURE  CBC WITH DIFFERENTIAL/PLATELET    EKG None  Radiology No results found.  Procedures Procedures (including critical care time)  Medications Ordered in ED Medications  ondansetron (ZOFRAN-ODT) disintegrating tablet 4 mg (4 mg Oral Given 04/15/18 0851)     Initial Impression / Assessment and Plan / ED Course  I have reviewed the triage vital signs and the nursing notes.  Pertinent labs & imaging results that were available during my care of the patient were reviewed by me and considered in my medical decision  making (see chart for details).     Labs are unremarkable except urinalysis does show 6-10 WBCs.  She will have the urine cultured and will empirically be treated with antibiotics.  She will take Tylenol Motrin and follow-up with her PCP this week to check on her urine culture and to recheck her blood pressure  Final Clinical Impressions(s) / ED Diagnoses   Final diagnoses:  Myalgia  Acute cystitis without hematuria    ED Discharge Orders         Ordered    sulfamethoxazole-trimethoprim (BACTRIM DS,SEPTRA DS) 800-160 MG tablet  2 times daily     04/15/18 1029           Bethann Berkshire, MD 04/15/18 [phone]

## 2018-04-15 NOTE — ED Triage Notes (Signed)
Pt is complaining of generalized body aches, not being able to sleep, and a sore throat since yesterday. Unsure of it she has had a fever. NAD.

## 2018-04-16 LAB — URINE CULTURE: Culture: <10000 — AB
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
## 2018-05-04 ENCOUNTER — Ambulatory Visit (HOSPITAL_COMMUNITY): Payer: 59 | Admitting: Psychiatry

## 2018-05-18 ENCOUNTER — Ambulatory Visit: Payer: Managed Care, Other (non HMO) | Admitting: Obstetrics and Gynecology

## 2018-05-18 NOTE — Progress Notes (Signed)
Patient ID: [name], female   DOB: 18-Jul-1995, 22 y.o.   MRN: [id number]  EXAM CANCELLED, PT TOO EARLY. CHART REVIEWED, RESCHEDULED FOR MARCH.  By signing my name below, I, Pietro Cassismily Tufford, attest that this documentation has been prepared under the direction and in the presence of Tilda BurrowFerguson, Twyla Dais V, MD. Electronically Signed: Pietro CassisEmily Tufford, Medical Scribe. 05/18/18. 1:32 PM.  Tilda BurrowJohn V Emoree Sasaki, MD

## 2018-05-18 NOTE — Patient Instructions (Signed)
Please return for your next pap near the time of your next birthday, near march 2022.
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
## 2018-05-25 ENCOUNTER — Encounter (HOSPITAL_COMMUNITY): Payer: Self-pay | Admitting: Psychiatry

## 2018-05-25 ENCOUNTER — Ambulatory Visit (INDEPENDENT_AMBULATORY_CARE_PROVIDER_SITE_OTHER): Payer: 59 | Admitting: Psychiatry

## 2018-05-25 VITALS — BP 145/86 | HR 105 | Ht 67.0 in | Wt 254.0 lb

## 2018-05-25 DIAGNOSIS — F4310 Post-traumatic stress disorder, unspecified: Secondary | ICD-10-CM

## 2018-05-25 DIAGNOSIS — F331 Major depressive disorder, recurrent, moderate: Secondary | ICD-10-CM

## 2018-05-25 MED ORDER — FLUOXETINE HCL 10 MG PO CAPS: 10.0000 mg | ORAL_CAPSULE | Freq: Every day | ORAL | 0 refills | Status: DC

## 2018-05-25 MED ORDER — FLUOXETINE HCL 20 MG PO CAPS: ORAL_CAPSULE | ORAL | 1 refills | Status: DC

## 2018-05-25 NOTE — Patient Instructions (Addendum)
1. Start fluoxetine 10 mg daily for two weeks, then 20 mg daily  2. Return to clinic in two months for 30 mins

## 2018-06-02 ENCOUNTER — Ambulatory Visit: Payer: Managed Care, Other (non HMO)

## 2018-06-08 ENCOUNTER — Ambulatory Visit: Payer: 59 | Admitting: Psychology

## 2018-06-09 ENCOUNTER — Ambulatory Visit: Payer: Managed Care, Other (non HMO)

## 2018-06-14 NOTE — L&D Delivery Note (Signed)
OB/GYN Faculty Practice Delivery Note  [name] is a 23 y.o. [id number] s/p SVD at [redacted]w[redacted]d. She was admitted for IOL secondary to cHTN with proteinuria in the office. Patient also GDMA2.   ROM: 5h 6m with clear fluid GBS Status: --/Positive (09/17 1348) Maximum Maternal Temperature: 98.10F  Labor Progress: . Patient presented to L&D for IOL secondary to cHTN with proteinuria in the office. Pre-E labs negative. Patient also GDMA2. Initial SVE: 3/50%-30 Patient received Cytotec, Pit and had AROM. She received an Epidural. She then progressed to complete.   Delivery Date/Time: 10/6 @ 1723 Delivery: Called to room and patient was complete and pushing. Head delivered in LOA position. No nuchal cord present. Shoulder and body delivered in usual fashion. Infant with spontaneous cry, placed on mother's abdomen, dried and stimulated. Cord clamped x 2 after 1-minute delay, and cut by FOB. Cord blood drawn. Placenta delivered spontaneously with gentle cord traction. Fundus firm with massage and Pitocin. Labia, perineum, vagina, and cervix inspected inspected with no lacerations. TXA given just after delivery due to history of PPH. Baby Weight: pending  Placenta: Sent to L&D Complications: None Lacerations: None EBL: 200 mL Analgesia: Epidural   Infant: APGAR (1 MIN): 9 APGAR (5 MINS): 9 APGAR (10 MINS):     Barrington Ellison, MD [facility] Fellow, [facility] for [facility], [facility] 03/20/2019, 5:49 PM

## 2018-07-06 ENCOUNTER — Ambulatory Visit: Payer: Managed Care, Other (non HMO) | Admitting: Adult Health

## 2018-07-17 ENCOUNTER — Telehealth (HOSPITAL_COMMUNITY): Payer: Self-pay | Admitting: *Deleted

## 2018-07-17 NOTE — Telephone Encounter (Signed)
Dr Vanetta ShawlHisada Patient called stating she is pregnant  & if it's safe for her to continue Prozac?  If so she requested a refill on the 20 mg Prozac

## 2018-07-17 NOTE — Telephone Encounter (Signed)
Left the voice message to contact us back; would like to discuss potential risk, although benefit outweigh risk.

## 2018-07-20 ENCOUNTER — Encounter: Payer: Self-pay | Admitting: Adult Health

## 2018-07-20 ENCOUNTER — Ambulatory Visit (INDEPENDENT_AMBULATORY_CARE_PROVIDER_SITE_OTHER): Payer: BC Managed Care – PPO | Admitting: Adult Health

## 2018-07-20 VITALS — BP 132/89 | HR 88 | Ht 67.0 in | Wt 268.2 lb

## 2018-07-20 DIAGNOSIS — Z3201 Encounter for pregnancy test, result positive: Secondary | ICD-10-CM | POA: Insufficient documentation

## 2018-07-20 DIAGNOSIS — R110 Nausea: Secondary | ICD-10-CM | POA: Diagnosis not present

## 2018-07-20 DIAGNOSIS — O3680X0 Pregnancy with inconclusive fetal viability, not applicable or unspecified: Secondary | ICD-10-CM | POA: Insufficient documentation

## 2018-07-20 DIAGNOSIS — Z3A01 Less than 8 weeks gestation of pregnancy: Secondary | ICD-10-CM | POA: Insufficient documentation

## 2018-07-20 DIAGNOSIS — R300 Dysuria: Secondary | ICD-10-CM | POA: Insufficient documentation

## 2018-07-20 DIAGNOSIS — N926 Irregular menstruation, unspecified: Secondary | ICD-10-CM | POA: Diagnosis not present

## 2018-07-20 DIAGNOSIS — R8290 Unspecified abnormal findings in urine: Secondary | ICD-10-CM | POA: Insufficient documentation

## 2018-07-20 LAB — POCT URINALYSIS DIPSTICK OB
Blood, UA: NEGATIVE
GLUCOSE, UA: NEGATIVE
Ketones, UA: NEGATIVE
Nitrite, UA: NEGATIVE
POC,PROTEIN,UA: NEGATIVE

## 2018-07-20 LAB — POCT URINE PREGNANCY: Preg Test, Ur: POSITIVE — AB

## 2018-07-20 MED ORDER — PRENATAL PLUS 27-1 MG PO TABS: 1.0000 | ORAL_TABLET | Freq: Every day | ORAL | 12 refills | Status: DC

## 2018-07-20 NOTE — Progress Notes (Signed)
Patient ID: [name], female   DOB: 1996-03-16, 23 y.o.   MRN: [id number] History of Present Illness: [name] is a 23 year old white female, married in for UPT, has missed period and had 3+HPTs.She has been off Prozac about a week, takes for PTSD. She works for [employer] in [place].  PCP is [facility].   Current Medications, Allergies, Past Medical History, Past Surgical History, Family History and Social History were reviewed in [employer] record.     Review of Systems: +missed period with 3+HPTs +nasuea  Burning with urination and odor    Physical Exam:BP 132/89 (BP Location: Right Arm, Patient Position: Sitting, Cuff Size: Large)   Pulse 88   Ht 5\' 7"  (1.702 m)   Wt 268 lb 3.2 oz (121.7 kg)   LMP 09/05/2017   Breastfeeding No   BMI 42.01 kg/m   UPT +, about 5+6 weeks, by LMP with EDD 03/17/2019. Urine dipstick trace leuks.  General:  Well developed, well nourished, no acute distress Skin:  Warm and dry Neck:  Midline trachea, normal thyroid, good ROM, no lymphadenopathy Lungs; Clear to auscultation bilaterally Cardiovascular: Regular rate and rhythm Abdomen:  Soft, non tender Psych:  No mood changes, alert and cooperative,seems happy Fall risk is low  Impression: 1. Pregnancy examination or test, positive result   2. Less than [redacted] weeks gestation of pregnancy   3. Burning with urination   4. Abnormal urine odor   5. Encounter to determine fetal viability of pregnancy, single or unspecified fetus       Plan: Eat often Decrease smoking Ok to take Prozac if needed, keep appt with Dr Vanetta Shawl Return in about 2 weeks for dating US/4 weeks for New OB Review handout First trimester and by [facility] ordered this encounter  Medications  . prenatal vitamin w/FE, FA (PRENATAL 1 + 1) 27-1 MG TABS tablet    Sig: Take 1 tablet by mouth daily at 12 noon.    Dispense:  30 each    Refill:  12    Order Specific Question:   Supervising Provider   Answer:   Duane Lope H [[id number]]  UA C&S sent

## 2018-07-20 NOTE — Patient Instructions (Signed)
First Trimester of Pregnancy  The first trimester of pregnancy is from week 1 until the end of week 13 (months 1 through 3). A week after a sperm fertilizes an egg, the egg will implant on the wall of the uterus. This embryo will begin to develop into a baby. Genes from you and your partner will form the baby. The female genes will determine whether the baby will be a boy or a girl. At 6-8 weeks, the eyes and face will be formed, and the heartbeat can be seen on ultrasound. At the end of 12 weeks, all the baby's organs will be formed.  Now that you are pregnant, you will want to do everything you can to have a healthy baby. Two of the most important things are to get good prenatal care and to follow your health care provider's instructions. Prenatal care is all the medical care you receive before the baby's birth. This care will help prevent, find, and treat any problems during the pregnancy and childbirth.  Body changes during your first trimester  Your body goes through many changes during pregnancy. The changes vary from woman to woman.   You may gain or lose a couple of pounds at first.   You may feel sick to your stomach (nauseous) and you may throw up (vomit). If the vomiting is uncontrollable, call your health care provider.   You may tire easily.   You may develop headaches that can be relieved by medicines. All medicines should be approved by your health care provider.   You may urinate more often. Painful urination may mean you have a bladder infection.   You may develop heartburn as a result of your pregnancy.   You may develop constipation because certain hormones are causing the muscles that push stool through your intestines to slow down.   You may develop hemorrhoids or swollen veins (varicose veins).   Your breasts may begin to grow larger and become tender. Your nipples may stick out more, and the tissue that surrounds them (areola) may become darker.   Your gums may bleed and may be  sensitive to brushing and flossing.   Dark spots or blotches (chloasma, mask of pregnancy) may develop on your face. This will likely fade after the baby is born.   Your menstrual periods will stop.   You may have a loss of appetite.   You may develop cravings for certain kinds of food.   You may have changes in your emotions from day to day, such as being excited to be pregnant or being concerned that something may go wrong with the pregnancy and baby.   You may have more vivid and strange dreams.   You may have changes in your hair. These can include thickening of your hair, rapid growth, and changes in texture. Some women also have hair loss during or after pregnancy, or hair that feels dry or thin. Your hair will most likely return to normal after your baby is born.  What to expect at prenatal visits  During a routine prenatal visit:   You will be weighed to make sure you and the baby are growing normally.   Your blood pressure will be taken.   Your abdomen will be measured to track your baby's growth.   The fetal heartbeat will be listened to between weeks 10 and 14 of your pregnancy.   Test results from any previous visits will be discussed.  Your health care provider may ask you:     How you are feeling.   If you are feeling the baby move.   If you have had any abnormal symptoms, such as leaking fluid, bleeding, severe headaches, or abdominal cramping.   If you are using any tobacco products, including cigarettes, chewing tobacco, and electronic cigarettes.   If you have any questions.  Other tests that may be performed during your first trimester include:   Blood tests to find your blood type and to check for the presence of any previous infections. The tests will also be used to check for low iron levels (anemia) and protein on red blood cells (Rh antibodies). Depending on your risk factors, or if you previously had diabetes during pregnancy, you may have tests to check for high blood sugar  that affects pregnant women (gestational diabetes).   Urine tests to check for infections, diabetes, or protein in the urine.   An ultrasound to confirm the proper growth and development of the baby.   Fetal screens for spinal cord problems (spina bifida) and Down syndrome.   HIV (human immunodeficiency virus) testing. Routine prenatal testing includes screening for HIV, unless you choose not to have this test.   You may need other tests to make sure you and the baby are doing well.  Follow these instructions at home:  Medicines   Follow your health care provider's instructions regarding medicine use. Specific medicines may be either safe or unsafe to take during pregnancy.   Take a prenatal vitamin that contains at least 600 micrograms (mcg) of folic acid.   If you develop constipation, try taking a stool softener if your health care provider approves.  Eating and drinking     Eat a balanced diet that includes fresh fruits and vegetables, whole grains, good sources of protein such as meat, eggs, or tofu, and low-fat dairy. Your health care provider will help you determine the amount of weight gain that is right for you.   Avoid raw meat and uncooked cheese. These carry germs that can cause birth defects in the baby.   Eating four or five small meals rather than three large meals a day may help relieve nausea and vomiting. If you start to feel nauseous, eating a few soda crackers can be helpful. Drinking liquids between meals, instead of during meals, also seems to help ease nausea and vomiting.   Limit foods that are high in fat and processed sugars, such as fried and sweet foods.   To prevent constipation:  ? Eat foods that are high in fiber, such as fresh fruits and vegetables, whole grains, and beans.  ? Drink enough fluid to keep your urine clear or pale yellow.  Activity   Exercise only as directed by your health care provider. Most women can continue their usual exercise routine during  pregnancy. Try to exercise for 30 minutes at least 5 days a week. Exercising will help you:  ? Control your weight.  ? Stay in shape.  ? Be prepared for labor and delivery.   Experiencing pain or cramping in the lower abdomen or lower back is a good sign that you should stop exercising. Check with your health care provider before continuing with normal exercises.   Try to avoid standing for long periods of time. Move your legs often if you must stand in one place for a long time.   Avoid heavy lifting.   Wear low-heeled shoes and practice good posture.   You may continue to have sex unless your health care   provider tells you not to.  Relieving pain and discomfort   Wear a good support bra to relieve breast tenderness.   Take warm sitz baths to soothe any pain or discomfort caused by hemorrhoids. Use hemorrhoid cream if your health care provider approves.   Rest with your legs elevated if you have leg cramps or low back pain.   If you develop varicose veins in your legs, wear support hose. Elevate your feet for 15 minutes, 3-4 times a day. Limit salt in your diet.  Prenatal care   Schedule your prenatal visits by the twelfth week of pregnancy. They are usually scheduled monthly at first, then more often in the last 2 months before delivery.   Write down your questions. Take them to your prenatal visits.   Keep all your prenatal visits as told by your health care provider. This is important.  Safety   Wear your seat belt at all times when driving.   Make a list of emergency phone numbers, including numbers for family, friends, the hospital, and police and fire departments.  General instructions   Ask your health care provider for a referral to a local prenatal education class. Begin classes no later than the beginning of month 6 of your pregnancy.   Ask for help if you have counseling or nutritional needs during pregnancy. Your health care provider can offer advice or refer you to specialists for help  with various needs.   Do not use hot tubs, steam rooms, or saunas.   Do not douche or use tampons or scented sanitary pads.   Do not cross your legs for long periods of time.   Avoid cat litter boxes and soil used by cats. These carry germs that can cause birth defects in the baby and possibly loss of the fetus by miscarriage or stillbirth.   Avoid all smoking, herbs, alcohol, and medicines not prescribed by your health care provider. Chemicals in these products affect the formation and growth of the baby.   Do not use any products that contain nicotine or tobacco, such as cigarettes and e-cigarettes. If you need help quitting, ask your health care provider. You may receive counseling support and other resources to help you quit.   Schedule a dentist appointment. At home, brush your teeth with a soft toothbrush and be gentle when you floss.  Contact a health care provider if:   You have dizziness.   You have mild pelvic cramps, pelvic pressure, or nagging pain in the abdominal area.   You have persistent nausea, vomiting, or diarrhea.   You have a bad smelling vaginal discharge.   You have pain when you urinate.   You notice increased swelling in your face, hands, legs, or ankles.   You are exposed to fifth disease or chickenpox.   You are exposed to German measles (rubella) and have never had it.  Get help right away if:   You have a fever.   You are leaking fluid from your vagina.   You have spotting or bleeding from your vagina.   You have severe abdominal cramping or pain.   You have rapid weight gain or loss.   You vomit blood or material that looks like coffee grounds.   You develop a severe headache.   You have shortness of breath.   You have any kind of trauma, such as from a fall or a car accident.  Summary   The first trimester of pregnancy is from week 1 until   the end of week 13 (months 1 through 3).   Your body goes through many changes during pregnancy. The changes vary from  woman to woman.   You will have routine prenatal visits. During those visits, your health care provider will examine you, discuss any test results you may have, and talk with you about how you are feeling.  This information is not intended to replace advice given to you by your health care provider. Make sure you discuss any questions you have with your health care provider.  Document Released: 05/25/2001 Document Revised: 05/12/2016 Document Reviewed: 05/12/2016  Elsevier Interactive Patient Education  2019 [employer]

## 2018-07-21 LAB — MICROSCOPIC EXAMINATION: CASTS: NONE SEEN /LPF

## 2018-07-21 LAB — URINALYSIS, ROUTINE W REFLEX MICROSCOPIC
Bilirubin, UA: NEGATIVE
GLUCOSE, UA: NEGATIVE
KETONES UA: NEGATIVE
Nitrite, UA: NEGATIVE
RBC UA: NEGATIVE
SPEC GRAV UA: 1.026 (ref 1.005–1.030)
UUROB: 0.2 mg/dL (ref 0.2–1.0)
pH, UA: 6 (ref 5.0–7.5)

## 2018-07-22 LAB — URINE CULTURE

## 2018-07-24 ENCOUNTER — Telehealth (HOSPITAL_COMMUNITY): Payer: Self-pay | Admitting: Psychiatry
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
## 2018-07-24 NOTE — Telephone Encounter (Signed)
Left voice message again to contact the office to discuss about her medication.

## 2018-07-27 ENCOUNTER — Ambulatory Visit (INDEPENDENT_AMBULATORY_CARE_PROVIDER_SITE_OTHER): Payer: BLUE CROSS/BLUE SHIELD | Admitting: Psychiatry

## 2018-07-27 VITALS — BP 129/76 | HR 113 | Ht 67.0 in | Wt 270.0 lb

## 2018-07-27 DIAGNOSIS — F4310 Post-traumatic stress disorder, unspecified: Secondary | ICD-10-CM | POA: Diagnosis not present

## 2018-07-27 DIAGNOSIS — F331 Major depressive disorder, recurrent, moderate: Secondary | ICD-10-CM

## 2018-07-27 MED ORDER — FLUOXETINE HCL 20 MG PO CAPS: 20.0000 mg | ORAL_CAPSULE | Freq: Every day | ORAL | 0 refills | Status: DC

## 2018-07-27 NOTE — Patient Instructions (Signed)
1. Continue fluoxetine 20 mg daily  2. Return to clinic in one month for 30 mins 4. Referral to therapy

## 2018-08-04 ENCOUNTER — Encounter (HOSPITAL_COMMUNITY): Payer: Self-pay | Admitting: Licensed Clinical Social Worker

## 2018-08-04 ENCOUNTER — Ambulatory Visit (INDEPENDENT_AMBULATORY_CARE_PROVIDER_SITE_OTHER): Payer: BLUE CROSS/BLUE SHIELD | Admitting: Licensed Clinical Social Worker

## 2018-08-04 DIAGNOSIS — F331 Major depressive disorder, recurrent, moderate: Secondary | ICD-10-CM | POA: Diagnosis not present

## 2018-08-04 DIAGNOSIS — F4310 Post-traumatic stress disorder, unspecified: Secondary | ICD-10-CM | POA: Diagnosis not present
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
## 2018-08-06 DIAGNOSIS — Z3A08 8 weeks gestation of pregnancy: Secondary | ICD-10-CM | POA: Diagnosis not present

## 2018-08-06 DIAGNOSIS — R0602 Shortness of breath: Secondary | ICD-10-CM | POA: Diagnosis not present

## 2018-08-06 DIAGNOSIS — O2341 Unspecified infection of urinary tract in pregnancy, first trimester: Secondary | ICD-10-CM | POA: Diagnosis not present

## 2018-08-10 ENCOUNTER — Ambulatory Visit (INDEPENDENT_AMBULATORY_CARE_PROVIDER_SITE_OTHER): Payer: BC Managed Care – PPO

## 2018-08-10 DIAGNOSIS — O3680X0 Pregnancy with inconclusive fetal viability, not applicable or unspecified: Secondary | ICD-10-CM | POA: Diagnosis not present

## 2018-08-10 NOTE — Progress Notes (Signed)
[place] 7+1 wks,single IUP w/ys,positive fht 148 bpm,crl 10.08 mm,normal right ovary,simple left ovarian cyst 3 x 2.7 x 2.5 cm,subchorionic hemorrhage 1.6 x 1.5 x .7cm

## 2018-08-11 DIAGNOSIS — M79672 Pain in left foot: Secondary | ICD-10-CM | POA: Diagnosis not present

## 2018-08-11 DIAGNOSIS — B070 Plantar wart: Secondary | ICD-10-CM | POA: Diagnosis not present

## 2018-08-17 ENCOUNTER — Other Ambulatory Visit: Payer: Self-pay

## 2018-08-17 ENCOUNTER — Encounter: Payer: Self-pay | Admitting: Advanced Practice Midwife

## 2018-08-17 ENCOUNTER — Ambulatory Visit (INDEPENDENT_AMBULATORY_CARE_PROVIDER_SITE_OTHER): Payer: BLUE CROSS/BLUE SHIELD | Admitting: Advanced Practice Midwife

## 2018-08-17 VITALS — BP 140/82 | HR 116 | Wt 275.0 lb

## 2018-08-17 DIAGNOSIS — O10919 Unspecified pre-existing hypertension complicating pregnancy, unspecified trimester: Secondary | ICD-10-CM

## 2018-08-17 DIAGNOSIS — O26891 Other specified pregnancy related conditions, first trimester: Secondary | ICD-10-CM

## 2018-08-17 DIAGNOSIS — Z6791 Unspecified blood type, Rh negative: Secondary | ICD-10-CM

## 2018-08-17 DIAGNOSIS — O0990 Supervision of high risk pregnancy, unspecified, unspecified trimester: Secondary | ICD-10-CM | POA: Insufficient documentation

## 2018-08-17 DIAGNOSIS — O10911 Unspecified pre-existing hypertension complicating pregnancy, first trimester: Secondary | ICD-10-CM

## 2018-08-17 DIAGNOSIS — O26899 Other specified pregnancy related conditions, unspecified trimester: Secondary | ICD-10-CM

## 2018-08-17 DIAGNOSIS — I10 Essential (primary) hypertension: Secondary | ICD-10-CM

## 2018-08-17 DIAGNOSIS — Z3A08 8 weeks gestation of pregnancy: Secondary | ICD-10-CM | POA: Diagnosis not present

## 2018-08-17 DIAGNOSIS — Z1389 Encounter for screening for other disorder: Secondary | ICD-10-CM

## 2018-08-17 DIAGNOSIS — Z331 Pregnant state, incidental: Secondary | ICD-10-CM

## 2018-08-17 DIAGNOSIS — O0991 Supervision of high risk pregnancy, unspecified, first trimester: Secondary | ICD-10-CM

## 2018-08-17 DIAGNOSIS — Z3682 Encounter for antenatal screening for nuchal translucency: Secondary | ICD-10-CM

## 2018-08-17 LAB — POCT URINALYSIS DIPSTICK OB
Glucose, UA: NEGATIVE
KETONES UA: NEGATIVE
Leukocytes, UA: NEGATIVE
Nitrite, UA: NEGATIVE
RBC UA: NEGATIVE

## 2018-08-17 NOTE — Progress Notes (Signed)
INITIAL OBSTETRICAL VISIT Patient name: [name] MRN [id number]  Date of birth: 1996-04-30 Chief Complaint:   Initial Prenatal Visit  History of Present Illness:   [name] is a 23 y.o. G50P2002 Caucasian female at [redacted]w[redacted]d by 7 week [place] with an Estimated Date of Delivery: 03/28/19 being seen today for her initial obstetrical visit.   Her obstetrical history is significant for IOL for CHTN, 2 term SVDs. .   Today she reports no complaints.  Patient's last menstrual period was 09/05/2017. Last pap 2019, had colpo/ECC. Results were: normal Review of Systems:   Pertinent items are noted in HPI Denies cramping/contractions, leakage of fluid, vaginal bleeding, abnormal vaginal discharge w/ itching/odor/irritation, headaches, visual changes, shortness of breath, chest pain, abdominal pain, severe nausea/vomiting, or problems with urination or bowel movements unless otherwise stated above.  Pertinent History Reviewed:  Reviewed past medical,surgical, social, obstetrical and family history.  Reviewed problem list, medications and allergies. OB History  Gravida Para Term Preterm AB Living  3 2 2     2   SAB TAB Ectopic Multiple Live Births        0 2    # Outcome Date GA Lbr Len/2nd Weight Sex Delivery Anes PTL Lv  3 Current           2 Term 01/25/17 [redacted]w[redacted]d / 01:26 7 lb 8.3 oz (3.41 kg) F Vag-Spont EPI  LIV  1 Term 06/13/13 [redacted]w[redacted]d 26:18 / 06:10 8 lb 0.2 oz (3.635 kg) M Vag-Spont EPI N LIV     Birth Comments: caput   Physical Assessment:   Vitals:   08/17/18 1043  BP: 140/82  Pulse: (!) 116  Weight: 275 lb (124.7 kg)  Body mass index is 43.07 kg/m.       Physical Examination:  General appearance - well appearing, and in no distress  Mental status - alert, oriented to person, place, and time  Psych:  She has a normal mood and affect  Skin - warm and dry, normal color, no suspicious lesions noted  Chest - effort normal, all lung fields clear to auscultation bilaterally  Heart -  normal rate and regular rhythm  Abdomen - soft, nontender  Extremities:  No swelling or varicosities noted    Fetal Heart Rate (bpm): 167 [place] via [place]  Results for orders placed or performed in visit on 08/17/18 (from the past 24 hour(s))  POC Urinalysis Dipstick OB   Collection Time: 08/17/18 11:08 AM  Result Value Ref Range   Color, UA     Clarity, UA     Glucose, UA Negative Negative   Bilirubin, UA     Ketones, UA neg    Spec Grav, UA     Blood, UA neg    pH, UA     POC,PROTEIN,UA Trace Negative, Trace, Small (1+), Moderate (2+), Large (3+), 4+   Urobilinogen, UA     Nitrite, UA neg    Leukocytes, UA Negative Negative   Appearance     Odor      Assessment & Plan:  1) High-Risk Pregnancy G3P2002 at [redacted]w[redacted]d with an Estimated Date of Delivery: 03/28/19   2) Initial OB visit  3) CHTN, not on meds.  To get a BP cuff for home monitoring.  Baseline labs.   Meds: No orders of the defined types were placed in this encounter.   Initial labs obtained Continue prenatal vitamins Reviewed n/v relief measures and warning s/s to report Reviewed recommended weight gain based on pre-gravid  BMI Encouraged well-balanced diet Watched video for carrier screening/genetic testing:  Genetic Screening discussed Integrated Screen: requested Cystic fibrosis screening declined SMA screening declined Fragile X screening declined Ultrasound discussed; fetal survey: requested CCNC completed  Follow-up: Return in about 5 weeks (around 09/21/2018) for US:NT+1st IT, LROB.   Orders Placed This Encounter  Procedures  . Urine Culture  . GC/Chlamydia Probe Amp  . US Fetal Nuchal Translucency Measurement  . Obstetric Panel, Including HIV  . Urinalysis, Routine w reflex microscopic  . Sickle cell screen  . Protein / creatinine ratio, urine  . Comprehensive metabolic panel  . Pain Management Screening Profile (10S)  . POC Urinalysis Dipstick OB    Jacklyn Shell DNP, CNM 08/17/2018 11:47  AM

## 2018-08-17 NOTE — Patient Instructions (Signed)
 First Trimester of Pregnancy The first trimester of pregnancy is from week 1 until the end of week 12 (months 1 through 3). A week after a sperm fertilizes an egg, the egg will implant on the wall of the uterus. This embryo will begin to develop into a baby. Genes from you and your partner are forming the baby. The female genes determine whether the baby is a boy or a girl. At 6-8 weeks, the eyes and face are formed, and the heartbeat can be seen on ultrasound. At the end of 12 weeks, all the baby's organs are formed.  Now that you are pregnant, you will want to do everything you can to have a healthy baby. Two of the most important things are to get good prenatal care and to follow your health care provider's instructions. Prenatal care is all the medical care you receive before the baby's birth. This care will help prevent, find, and treat any problems during the pregnancy and childbirth. BODY CHANGES Your body goes through many changes during pregnancy. The changes vary from woman to woman.   You may gain or lose a couple of pounds at first.  You may feel sick to your stomach (nauseous) and throw up (vomit). If the vomiting is uncontrollable, call your health care provider.  You may tire easily.  You may develop headaches that can be relieved by medicines approved by your health care provider.  You may urinate more often. Painful urination may mean you have a bladder infection.  You may develop heartburn as a result of your pregnancy.  You may develop constipation because certain hormones are causing the muscles that push waste through your intestines to slow down.  You may develop hemorrhoids or swollen, bulging veins (varicose veins).  Your breasts may begin to grow larger and become tender. Your nipples may stick out more, and the tissue that surrounds them (areola) may become darker.  Your gums may bleed and may be sensitive to brushing and flossing.  Dark spots or blotches  (chloasma, mask of pregnancy) may develop on your face. This will likely fade after the baby is born.  Your menstrual periods will stop.  You may have a loss of appetite.  You may develop cravings for certain kinds of food.  You may have changes in your emotions from day to day, such as being excited to be pregnant or being concerned that something may go wrong with the pregnancy and baby.  You may have more vivid and strange dreams.  You may have changes in your hair. These can include thickening of your hair, rapid growth, and changes in texture. Some women also have hair loss during or after pregnancy, or hair that feels dry or thin. Your hair will most likely return to normal after your baby is born. WHAT TO EXPECT AT YOUR PRENATAL VISITS During a routine prenatal visit:  You will be weighed to make sure you and the baby are growing normally.  Your blood pressure will be taken.  Your abdomen will be measured to track your baby's growth.  The fetal heartbeat will be listened to starting around week 10 or 12 of your pregnancy.  Test results from any previous visits will be discussed. Your health care provider may ask you:  How you are feeling.  If you are feeling the baby move.  If you have had any abnormal symptoms, such as leaking fluid, bleeding, severe headaches, or abdominal cramping.  If you have any questions. Other   tests that may be performed during your first trimester include:  Blood tests to find your blood type and to check for the presence of any previous infections. They will also be used to check for low iron levels (anemia) and Rh antibodies. Later in the pregnancy, blood tests for diabetes will be done along with other tests if problems develop.  Urine tests to check for infections, diabetes, or protein in the urine.  An ultrasound to confirm the proper growth and development of the baby.  An amniocentesis to check for possible genetic problems.  Fetal  screens for spina bifida and Down syndrome.  You may need other tests to make sure you and the baby are doing well. HOME CARE INSTRUCTIONS  Medicines  Follow your health care provider's instructions regarding medicine use. Specific medicines may be either safe or unsafe to take during pregnancy.  Take your prenatal vitamins as directed.  If you develop constipation, try taking a stool softener if your health care provider approves. Diet  Eat regular, well-balanced meals. Choose a variety of foods, such as meat or vegetable-based protein, fish, milk and low-fat dairy products, vegetables, fruits, and whole grain breads and cereals. Your health care provider will help you determine the amount of weight gain that is right for you.  Avoid raw meat and uncooked cheese. These carry germs that can cause birth defects in the baby.  Eating four or five small meals rather than three large meals a day may help relieve nausea and vomiting. If you start to feel nauseous, eating a few soda crackers can be helpful. Drinking liquids between meals instead of during meals also seems to help nausea and vomiting.  If you develop constipation, eat more high-fiber foods, such as fresh vegetables or fruit and whole grains. Drink enough fluids to keep your urine clear or pale yellow. Activity and Exercise  Exercise only as directed by your health care provider. Exercising will help you:  Control your weight.  Stay in shape.  Be prepared for labor and delivery.  Experiencing pain or cramping in the lower abdomen or low back is a good sign that you should stop exercising. Check with your health care provider before continuing normal exercises.  Try to avoid standing for long periods of time. Move your legs often if you must stand in one place for a long time.  Avoid heavy lifting.  Wear low-heeled shoes, and practice good posture.  You may continue to have sex unless your health care provider directs you  otherwise. Relief of Pain or Discomfort  Wear a good support bra for breast tenderness.   Take warm sitz baths to soothe any pain or discomfort caused by hemorrhoids. Use hemorrhoid cream if your health care provider approves.   Rest with your legs elevated if you have leg cramps or low back pain.  If you develop varicose veins in your legs, wear support hose. Elevate your feet for 15 minutes, 3-4 times a day. Limit salt in your diet. Prenatal Care  Schedule your prenatal visits by the twelfth week of pregnancy. They are usually scheduled monthly at first, then more often in the last 2 months before delivery.  Write down your questions. Take them to your prenatal visits.  Keep all your prenatal visits as directed by your health care provider. Safety  Wear your seat belt at all times when driving.  Make a list of emergency phone numbers, including numbers for family, friends, the hospital, and police and fire departments. General   Tips  Ask your health care provider for a referral to a local prenatal education class. Begin classes no later than at the beginning of month 6 of your pregnancy.  Ask for help if you have counseling or nutritional needs during pregnancy. Your health care provider can offer advice or refer you to specialists for help with various needs.  Do not use hot tubs, steam rooms, or saunas.  Do not douche or use tampons or scented sanitary pads.  Do not cross your legs for long periods of time.  Avoid cat litter boxes and soil used by cats. These carry germs that can cause birth defects in the baby and possibly loss of the fetus by miscarriage or stillbirth.  Avoid all smoking, herbs, alcohol, and medicines not prescribed by your health care provider. Chemicals in these affect the formation and growth of the baby.  Schedule a dentist appointment. At home, brush your teeth with a soft toothbrush and be gentle when you floss. SEEK MEDICAL CARE IF:   You have  dizziness.  You have mild pelvic cramps, pelvic pressure, or nagging pain in the abdominal area.  You have persistent nausea, vomiting, or diarrhea.  You have a bad smelling vaginal discharge.  You have pain with urination.  You notice increased swelling in your face, hands, legs, or ankles. SEEK IMMEDIATE MEDICAL CARE IF:   You have a fever.  You are leaking fluid from your vagina.  You have spotting or bleeding from your vagina.  You have severe abdominal cramping or pain.  You have rapid weight gain or loss.  You vomit blood or material that looks like coffee grounds.  You are exposed to German measles and have never had them.  You are exposed to fifth disease or chickenpox.  You develop a severe headache.  You have shortness of breath.  You have any kind of trauma, such as from a fall or a car accident. Document Released: 05/25/2001 Document Revised: 10/15/2013 Document Reviewed: 04/10/2013 [employer] Patient Information 2015 [employer]. This information is not intended to replace advice given to you by your health care provider. Make sure you discuss any questions you have with your health care provider.   Nausea & Vomiting  Have saltine crackers or pretzels by your bed and eat a few bites before you raise your head out of bed in the morning  Eat small frequent meals throughout the day instead of large meals  Drink plenty of fluids throughout the day to stay hydrated, just don't drink a lot of fluids with your meals.  This can make your stomach fill up faster making you feel sick  Do not brush your teeth right after you eat  Products with real ginger are good for nausea, like ginger ale and ginger hard candy Make sure it says made with real ginger!  Sucking on sour candy like lemon heads is also good for nausea  If your prenatal vitamins make you nauseated, take them at night so you will sleep through the nausea  Sea Bands  If you feel like you need  medicine for the nausea & vomiting please let us know  If you are unable to keep any fluids or food down please let us know   Constipation  Drink plenty of fluid, preferably water, throughout the day  Eat foods high in fiber such as fruits, vegetables, and grains  Exercise, such as walking, is a good way to keep your bowels regular  Drink warm fluids, especially warm   prune juice, or decaf coffee  Eat a 1/2 cup of real oatmeal (not instant), 1/2 cup applesauce, and 1/2-1 cup warm prune juice every day  If needed, you may take Colace (docusate sodium) stool softener once or twice a day to help keep the stool soft. If you are pregnant, wait until you are out of your first trimester (12-14 weeks of pregnancy)  If you still are having problems with constipation, you may take Miralax once daily as needed to help keep your bowels regular.  If you are pregnant, wait until you are out of your first trimester (12-14 weeks of pregnancy)  Safe Medications in Pregnancy   Acne: Benzoyl Peroxide Salicylic Acid  Backache/Headache: Tylenol: 2 regular strength every 4 hours OR              2 Extra strength every 6 hours  Colds/Coughs/Allergies: Benadryl (alcohol free) 25 mg every 6 hours as needed Breath right strips Claritin Cepacol throat lozenges Chloraseptic throat spray Cold-Eeze- up to three times per day Cough drops, alcohol free Flonase (by prescription only) Guaifenesin Mucinex Robitussin DM (plain only, alcohol free) Saline nasal spray/drops Sudafed (pseudoephedrine) & Actifed ** use only after [redacted] weeks gestation and if you do not have high blood pressure Tylenol Vicks Vaporub Zinc lozenges Zyrtec   Constipation: Colace Ducolax suppositories Fleet enema Glycerin suppositories Metamucil Milk of magnesia Miralax Senokot Smooth move tea  Diarrhea: Kaopectate Imodium A-D  *NO pepto Bismol  Hemorrhoids: Anusol Anusol HC Preparation  H Tucks  Indigestion: Tums Maalox Mylanta Zantac  Pepcid  Insomnia: Benadryl (alcohol free) 25mg every 6 hours as needed Tylenol PM Unisom, no Gelcaps  Leg Cramps: Tums MagGel  Nausea/Vomiting:  Bonine Dramamine Emetrol Ginger extract Sea bands Meclizine  Nausea medication to take during pregnancy:  Unisom (doxylamine succinate 25 mg tablets) Take one tablet daily at bedtime. If symptoms are not adequately controlled, the dose can be increased to a maximum recommended dose of two tablets daily (1/2 tablet in the morning, 1/2 tablet mid-afternoon and one at bedtime). Vitamin B6 100mg tablets. Take one tablet twice a day (up to 200 mg per day).  Skin Rashes: Aveeno products Benadryl cream or 25mg every 6 hours as needed Calamine Lotion 1% cortisone cream  Yeast infection: Gyne-lotrimin 7 Monistat 7   **If taking multiple medications, please check labels to avoid duplicating the same active ingredients **take medication as directed on the label ** Do not exceed 4000 mg of tylenol in 24 hours **Do not take medications that contain aspirin or ibuprofen      

## 2018-08-18 LAB — OBSTETRIC PANEL, INCLUDING HIV
Antibody Screen: NEGATIVE
BASOS ABS: 0 10*3/uL (ref 0.0–0.2)
Basos: 0 %
EOS (ABSOLUTE): 0.2 10*3/uL (ref 0.0–0.4)
Eos: 2 %
HEP B S AG: NEGATIVE
HIV SCREEN 4TH GENERATION: NONREACTIVE
Hematocrit: 37.5 % (ref 34.0–46.6)
Hemoglobin: 13 g/dL (ref 11.1–15.9)
IMMATURE GRANULOCYTES: 0 %
Immature Grans (Abs): 0 10*3/uL (ref 0.0–0.1)
LYMPHS ABS: 2.6 10*3/uL (ref 0.7–3.1)
LYMPHS: 21 %
MCH: 30 pg (ref 26.6–33.0)
MCHC: 34.7 g/dL (ref 31.5–35.7)
MCV: 86 fL (ref 79–97)
MONOS ABS: 0.8 10*3/uL (ref 0.1–0.9)
Monocytes: 6 %
NEUTROS PCT: 71 %
Neutrophils Absolute: 9.1 10*3/uL — ABNORMAL HIGH (ref 1.4–7.0)
PLATELETS: 217 10*3/uL (ref 150–450)
RBC: 4.34 x10E6/uL (ref 3.77–5.28)
RDW: 12.8 % (ref 11.7–15.4)
RPR Ser Ql: NONREACTIVE
Rh Factor: NEGATIVE
Rubella Antibodies, IGG: 5.93 index (ref >0.99)
WBC: 12.8 10*3/uL — ABNORMAL HIGH (ref 3.4–10.8)

## 2018-08-18 LAB — COMPREHENSIVE METABOLIC PANEL
ALBUMIN: 3.7 g/dL — AB (ref 3.9–5.0)
ALT: 11 IU/L (ref 0–32)
AST: 13 IU/L (ref 0–40)
Albumin/Globulin Ratio: 1.4 (ref 1.2–2.2)
Alkaline Phosphatase: 77 IU/L (ref 39–117)
BUN/Creatinine Ratio: 14 (ref 9–23)
BUN: 8 mg/dL (ref 6–20)
Bilirubin Total: <0.2 mg/dL (ref 0.0–1.2)
CALCIUM: 9.2 mg/dL (ref 8.7–10.2)
CO2: 19 mmol/L — AB (ref 20–29)
CREATININE: 0.57 mg/dL (ref 0.57–1.00)
Chloride: 102 mmol/L (ref 96–106)
GFR, EST AFRICAN AMERICAN: 150 mL/min/{1.73_m2} (ref >59)
GFR, EST NON AFRICAN AMERICAN: 130 mL/min/{1.73_m2} (ref >59)
GLUCOSE: 130 mg/dL — AB (ref 65–99)
Globulin, Total: 2.7 g/dL (ref 1.5–4.5)
Potassium: 3.9 mmol/L (ref 3.5–5.2)
Sodium: 136 mmol/L (ref 134–144)
TOTAL PROTEIN: 6.4 g/dL (ref 6.0–8.5)

## 2018-08-18 LAB — PROTEIN / CREATININE RATIO, URINE
CREATININE, UR: 217.5 mg/dL
Protein, Ur: 16.9 mg/dL
Protein/Creat Ratio: 78 mg/g creat (ref 0–200)

## 2018-08-18 LAB — GC/CHLAMYDIA PROBE AMP
CHLAMYDIA, DNA PROBE: NEGATIVE
Neisseria gonorrhoeae by PCR: NEGATIVE

## 2018-08-18 LAB — URINALYSIS, ROUTINE W REFLEX MICROSCOPIC
Bilirubin, UA: NEGATIVE
Glucose, UA: NEGATIVE
Ketones, UA: NEGATIVE
LEUKOCYTES UA: NEGATIVE
NITRITE UA: NEGATIVE
PH UA: 5.5 (ref 5.0–7.5)
RBC, UA: NEGATIVE
Specific Gravity, UA: >=1.03 — AB (ref 1.005–1.030)
Urobilinogen, Ur: 0.2 mg/dL (ref 0.2–1.0)

## 2018-08-18 LAB — SICKLE CELL SCREEN: Sickle Cell Screen: NEGATIVE

## 2018-08-19 LAB — URINE CULTURE
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
## 2018-08-23 DIAGNOSIS — Z029 Encounter for administrative examinations, unspecified: Secondary | ICD-10-CM

## 2018-08-25 ENCOUNTER — Ambulatory Visit (HOSPITAL_COMMUNITY): Payer: 59 | Admitting: Psychiatry

## 2018-08-28 DIAGNOSIS — J029 Acute pharyngitis, unspecified: Secondary | ICD-10-CM | POA: Diagnosis not present

## 2018-08-28 DIAGNOSIS — Z6841 Body Mass Index (BMI) 40.0 and over, adult: Secondary | ICD-10-CM | POA: Diagnosis not present

## 2018-08-28 DIAGNOSIS — R509 Fever, unspecified: Secondary | ICD-10-CM | POA: Diagnosis not present

## 2018-09-08 ENCOUNTER — Ambulatory Visit (HOSPITAL_COMMUNITY): Payer: BLUE CROSS/BLUE SHIELD | Admitting: Licensed Clinical Social Worker

## 2018-09-08 ENCOUNTER — Other Ambulatory Visit: Payer: Self-pay

## 2018-09-20 ENCOUNTER — Telehealth: Payer: Self-pay | Admitting: *Deleted

## 2018-09-20 NOTE — Telephone Encounter (Signed)
Patient informed that we are not allowing visitors or children to come to appointments at this time. Patient denies any contact with anyone suspected or confirmed of having COVID-19. Pt denies fever, cough, sob, muscle pain, diarrhea, rash, vomiting, abdominal pain, red eye, weakness, bruising or bleeding, joint pain or severe headache.  

## 2018-09-21 ENCOUNTER — Ambulatory Visit (INDEPENDENT_AMBULATORY_CARE_PROVIDER_SITE_OTHER): Payer: BLUE CROSS/BLUE SHIELD

## 2018-09-21 ENCOUNTER — Ambulatory Visit (INDEPENDENT_AMBULATORY_CARE_PROVIDER_SITE_OTHER): Payer: BLUE CROSS/BLUE SHIELD | Admitting: Obstetrics and Gynecology

## 2018-09-21 ENCOUNTER — Other Ambulatory Visit: Payer: Self-pay

## 2018-09-21 ENCOUNTER — Encounter: Payer: Self-pay | Admitting: Obstetrics and Gynecology

## 2018-09-21 VITALS — BP 135/88 | HR 96 | Temp 98.1°F | Wt 266.6 lb

## 2018-09-21 DIAGNOSIS — O161 Unspecified maternal hypertension, first trimester: Secondary | ICD-10-CM

## 2018-09-21 DIAGNOSIS — O0990 Supervision of high risk pregnancy, unspecified, unspecified trimester: Secondary | ICD-10-CM

## 2018-09-21 DIAGNOSIS — Z331 Pregnant state, incidental: Secondary | ICD-10-CM

## 2018-09-21 DIAGNOSIS — O09899 Supervision of other high risk pregnancies, unspecified trimester: Secondary | ICD-10-CM

## 2018-09-21 DIAGNOSIS — I10 Essential (primary) hypertension: Secondary | ICD-10-CM

## 2018-09-21 DIAGNOSIS — Z283 Underimmunization status: Secondary | ICD-10-CM

## 2018-09-21 DIAGNOSIS — Z1389 Encounter for screening for other disorder: Secondary | ICD-10-CM

## 2018-09-21 DIAGNOSIS — Z3682 Encounter for antenatal screening for nuchal translucency: Secondary | ICD-10-CM | POA: Diagnosis not present

## 2018-09-21 LAB — POCT URINALYSIS DIPSTICK OB
Blood, UA: NEGATIVE
Glucose, UA: NEGATIVE
Ketones, UA: NEGATIVE
Leukocytes, UA: NEGATIVE
Nitrite, UA: NEGATIVE
POC,PROTEIN,UA: NEGATIVE

## 2018-09-21 NOTE — Progress Notes (Signed)
Patient ID: KROSBY BRING, female   DOB: [birth year], 23 y.o.   MRN: [id number]    [facility] PREGNANCY VISIT Patient name: [name] MRN [id number]  Date of birth: 17-May-1996 Chief Complaint:   High Risk Gestation (1st IT)  History of Present Illness:   [name] is a 23 y.o. G74P2002 female at [redacted]w[redacted]d with an Estimated Date of Delivery: 03/28/19 being seen today for ongoing management of a high-risk pregnancy complicated by chronic HTN.Did NOT require BP meds with last pregnancy, had IOL at 39wk after BP's reached 140/90 at 39 wk eval.Plans for tubal after delivery, will breastfeed. Is going to try not to get an epidural this time around Today she reports no complaints. Contractions: Not present. Vag. Bleeding: Small.   . denies leaking of fluid.  Review of Systems:   Pertinent items are noted in HPI Denies abnormal vaginal discharge w/ itching/odor/irritation, headaches, visual changes, shortness of breath, chest pain, abdominal pain, severe nausea/vomiting, or problems with urination or bowel movements unless otherwise stated above. Pertinent History Reviewed:  Reviewed past medical,surgical, social, obstetrical and family history.  Reviewed problem list, medications and allergies. Physical Assessment:   Vitals:   09/21/18 1004  BP: 135/88  Pulse: 96  Temp: 98.1 F (36.7 C)  Weight: 266 lb 9.6 oz (120.9 kg)  Body mass index is 41.76 kg/m.           Physical Examination:   General appearance: alert, well appearing, and in no distress  Mental status: alert, oriented to person, place, and time, normal mood, behavior, speech, dress, motor activity, and thought processes, affect appropriate to mood  Skin: warm & dry   Extremities: Edema: None    Cardiovascular: normal heart rate noted  Respiratory: normal respiratory effort, no distress  Abdomen: gravid, soft, non-tender  Pelvic: Cervical exam deferred         Fetal Status:          Fetal Surveillance Testing today: [place] 13+1  wks,measurements c/w dates,anterior placenta gr 0,normal right ovary,simple left ovarian cyst 3.7 x 3.3 x 3.3 cm,crl 74.66 mm,NT 2.1 mm,NB present   Results for orders placed or performed in visit on 09/21/18 (from the past 24 hour(s))  POC Urinalysis Dipstick OB   Collection Time: 09/21/18 10:04 AM  Result Value Ref Range   Color, UA     Clarity, UA     Glucose, UA Negative Negative   Bilirubin, UA     Ketones, UA neg    Spec Grav, UA     Blood, UA neg    pH, UA     POC,PROTEIN,UA Negative Negative, Trace, Small (1+), Moderate (2+), Large (3+), 4+   Urobilinogen, UA     Nitrite, UA neg    Leukocytes, UA Negative Negative   Appearance     Odor      Assessment & Plan:  1) High-risk pregnancy  CHTN no meds,G3P2002 at [redacted]w[redacted]d with an Estimated Date of Delivery: 03/28/19   2) CHTN, stable, currently no meds  Meds: No orders of the defined types were placed in this encounter.   Labs/procedures today: NT, 1st ITS  Treatment Plan:  1. F/u in 4 weeks for 2nd ITS  Follow-up: Return in about 4 weeks (around 10/19/2018) for Labs: 2nd IT.  Orders Placed This Encounter  Procedures  . Integrated 1  . POC Urinalysis Dipstick OB   By signing my name below, I, Arnette Norris, attest that this documentation has been prepared under the direction  and in the presence of Tilda BurrowFerguson, Amneet Cendejas V, MD. Electronically Signed: Arnette NorrisMari Johnson Medical Scribe. 09/21/18. 10:29 AM.  I personally performed the services described in this documentation, which was SCRIBED in my presence. The recorded information has been reviewed and considered accurate. It has been edited as necessary during review. Tilda BurrowJohn V Min Tunnell, MD

## 2018-09-21 NOTE — Progress Notes (Signed)
[place] 13+1 wks,measurements c/w dates,anterior placenta gr 0,normal right ovary,simple left ovarian cyst 3.7 x 3.3 x 3.3 cm,crl 74.66 mm,NT 2.1 mm,NB present

## 2018-09-22 ENCOUNTER — Encounter (HOSPITAL_COMMUNITY): Payer: Self-pay | Admitting: Licensed Clinical Social Worker

## 2018-09-22 ENCOUNTER — Ambulatory Visit (INDEPENDENT_AMBULATORY_CARE_PROVIDER_SITE_OTHER): Payer: BLUE CROSS/BLUE SHIELD | Admitting: Licensed Clinical Social Worker

## 2018-09-22 DIAGNOSIS — F331 Major depressive disorder, recurrent, moderate: Secondary | ICD-10-CM
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
## 2018-09-23 LAB — INTEGRATED 1
Crown Rump Length: 74.7 mm
Gest. Age on Collection Date: 13.3 weeks
Maternal Age at EDD: 25.6 yr
Nuchal Translucency (NT): 2.1 mm
Number of Fetuses: 1
PAPP-A Value: 349.2 ng/mL
Weight: 267 [lb_av]

## 2018-09-26 DIAGNOSIS — Z6841 Body Mass Index (BMI) 40.0 and over, adult: Secondary | ICD-10-CM | POA: Diagnosis not present

## 2018-09-26 DIAGNOSIS — Z3A13 13 weeks gestation of pregnancy: Secondary | ICD-10-CM | POA: Diagnosis not present

## 2018-09-26 DIAGNOSIS — G629 Polyneuropathy, unspecified: Secondary | ICD-10-CM | POA: Diagnosis not present

## 2018-09-28 ENCOUNTER — Telehealth: Payer: Self-pay | Admitting: Obstetrics and Gynecology

## 2018-09-28 NOTE — Telephone Encounter (Signed)
Left message advising pt to call PCP that ordered blood work to discuss results with. JSY

## 2018-09-28 NOTE — Telephone Encounter (Signed)
Patient called stating that she got some blood work results from her PCP and she would like to speak with Dr. Emelda Fear regarding those results. Please contact pt

## 2018-10-04 ENCOUNTER — Telehealth: Payer: Self-pay | Admitting: *Deleted

## 2018-10-04 ENCOUNTER — Telehealth: Payer: Self-pay | Admitting: Obstetrics and Gynecology

## 2018-10-04 NOTE — Telephone Encounter (Signed)
Spoke with [name] at [facility]. Vit. B12 was 130 on 09/26/18. Please advise what to do. Thanks!! JSY

## 2018-10-04 NOTE — Telephone Encounter (Signed)
Left message @ 4:22 pm with Chasity @ [facility]. JSY

## 2018-10-04 NOTE — Telephone Encounter (Signed)
Chasity with [facility] calling to see if Dr. Emelda Fear would like to have her B12 repeated or if he wants her to start on the B12 injections or pills? States results were faxed to the office. CB#: [phone]

## 2018-10-04 NOTE — Telephone Encounter (Signed)
Spoke with Dr. Emelda Fear. Pt has a good hgb at 13.0 so no iron absorption problems seems to be present. No b12 shots necessary at this time. Pt can take prenatal with folic acid. JSY

## 2018-10-04 NOTE — Telephone Encounter (Signed)
Spoke with Chasity @ [facility] giving her Dr. Rayna Sexton recommendations. JSY

## 2018-10-04 NOTE — Telephone Encounter (Signed)
Spoke with Chasity @ [facility] giving her Dr. Ferguson's recommendations. JSY 

## 2018-10-09 DIAGNOSIS — Z6841 Body Mass Index (BMI) 40.0 and over, adult: Secondary | ICD-10-CM | POA: Diagnosis not present

## 2018-10-09 DIAGNOSIS — R1900 Intra-abdominal and pelvic swelling, mass and lump, unspecified site: Secondary | ICD-10-CM | POA: Diagnosis not present

## 2018-10-09 DIAGNOSIS — M2550 Pain in unspecified joint: Secondary | ICD-10-CM | POA: Diagnosis not present
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
## 2018-10-11 ENCOUNTER — Telehealth: Payer: Self-pay | Admitting: Obstetrics and Gynecology

## 2018-10-11 ENCOUNTER — Encounter (HOSPITAL_COMMUNITY): Payer: Self-pay | Admitting: Psychiatry

## 2018-10-11 ENCOUNTER — Ambulatory Visit (INDEPENDENT_AMBULATORY_CARE_PROVIDER_SITE_OTHER): Payer: BLUE CROSS/BLUE SHIELD | Admitting: Psychiatry

## 2018-10-11 ENCOUNTER — Other Ambulatory Visit: Payer: Self-pay

## 2018-10-11 DIAGNOSIS — F331 Major depressive disorder, recurrent, moderate: Secondary | ICD-10-CM | POA: Diagnosis not present

## 2018-10-11 DIAGNOSIS — F4310 Post-traumatic stress disorder, unspecified: Secondary | ICD-10-CM | POA: Diagnosis not present

## 2018-10-11 MED ORDER — DULOXETINE HCL 30 MG PO CPEP: 30.0000 mg | ORAL_CAPSULE | Freq: Every day | ORAL | 1 refills | Status: DC
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
## 2018-10-11 NOTE — Telephone Encounter (Signed)
Patient states she has seen in PCP in regards to pain she is having. They have ran several tests and said she does have a "condition" and does need something for pain but they do not know what to give her and was told to call us.  States they will send records if we need them.  Advised patient to have them to send to [place] so a provider can review.  Fax number given to patient.

## 2018-10-11 NOTE — Telephone Encounter (Signed)
Patient called, stated she saw her primary doctor and the patient is stating that the primary doctor stated she needs a pain medication, but they will not prescribe it because she is pregnant.  They told her to call here and request it.  She is 16 weeks.  [facility] [employer]  [phone]

## 2018-10-11 NOTE — Patient Instructions (Signed)
1.  Start duloxetine 30 mg daily  2.  Discontinue fluoxetine 3.  Next appointment 6/14 at 1:40 for 20 mins

## 2018-10-12 ENCOUNTER — Telehealth: Payer: Self-pay | Admitting: Obstetrics & Gynecology

## 2018-10-12 NOTE — Telephone Encounter (Signed)
Patient called, wanted to know if you recevied records from   [phone]

## 2018-10-13 ENCOUNTER — Ambulatory Visit (INDEPENDENT_AMBULATORY_CARE_PROVIDER_SITE_OTHER): Payer: BLUE CROSS/BLUE SHIELD | Admitting: Licensed Clinical Social Worker

## 2018-10-13 ENCOUNTER — Other Ambulatory Visit: Payer: Self-pay

## 2018-10-13 ENCOUNTER — Encounter (HOSPITAL_COMMUNITY): Payer: Self-pay | Admitting: Licensed Clinical Social Worker

## 2018-10-13 DIAGNOSIS — F331 Major depressive disorder, recurrent, moderate: Secondary | ICD-10-CM | POA: Diagnosis not present

## 2018-10-13 NOTE — Telephone Encounter (Signed)
Called patient back and informed her that Selena Batten had reviewed her records and didn't recommend any other pain meds at this time other than tylenol. Patient stated that we are not taking her serious. Her PCP told her she needs something for pain. Advised patient that I would relay this information to [employer].

## 2018-10-13 NOTE — Progress Notes (Signed)
Virtual Visit via Video Note  I connected with [name] on 10/13/18 at 11:00 AM EDT by a video enabled telemedicine application and verified that I am speaking with the correct person using two identifiers.   I discussed the limitations of evaluation and management by telemedicine and the availability of in person appointments. The patient expressed understanding and agreed to proceed.  History of Present Illness: Major depressive disorder, recurrent episode, moderate with anxious distress.  [name] presents oriented x5 (person, place, situation, time and object), casually dressed, appropriately groomed, average height, overweight, and cooperative to address mood. Patient has a minimal history of medical treatment including hypertension and a minimal history of mental health treatment including medication management.   Patient engaged in session. Patient responded well to interventions. Patient continues to meet criteria for Major depressive disorder, recurrent episode, moderate with anxious distress. Patient will continue in outpatient therapy due to being the least restrictive service to meet her needs. Patient made minimal progress on her goals.    Observations/Objective: Physically: Patient is in a lot of physical pain. She has shooting pain in her arms, legs, and back. She has gone to the doctor but was not given any help. She is frustrated with her doctors and is going to try to reach out to get pain medication.  Spiritually/values: No issues identified.  Relationships: Patient has strained relationships with others. She recognizes that her irritability has caused her to push people away and isolate.  Emotionally/Mentally/Behavior: Patient's mood is irritable and depress. She recognized that her physical pain is impacting her mood. She feels frustrated with her pain, herself, her doctors and others are getting on her nerves. Patient is focusing on getting her physical pain under control. It  will help with her irritability and her relationships.   Assessment and Plan: Therapist reviewed patient's recent thoughts and behaviors. Therapist utilized CBT to address mood. Therapist processed patient's feelings to identify triggers for mood. Therapist discussed with patient her physical health.   Follow Up Instructions: Patient will return in 2-3 weeks.    I discussed the assessment and treatment plan with the patient. The patient was provided an opportunity to ask questions and all were answered. The patient agreed with the plan and demonstrated an understanding of the instructions.   The patient was advised to call back or seek an in-person evaluation if the symptoms worsen or if the condition fails to improve as anticipated.  I provided 40 minutes of non-face-to-face time during this encounter.   Bynum Bellows, LCSW

## 2018-10-18 ENCOUNTER — Encounter: Payer: Self-pay | Admitting: *Deleted

## 2018-10-19 ENCOUNTER — Encounter: Payer: Self-pay | Admitting: Obstetrics & Gynecology

## 2018-10-19 ENCOUNTER — Other Ambulatory Visit: Payer: Self-pay

## 2018-10-19 ENCOUNTER — Ambulatory Visit (INDEPENDENT_AMBULATORY_CARE_PROVIDER_SITE_OTHER): Payer: BLUE CROSS/BLUE SHIELD | Admitting: Obstetrics & Gynecology

## 2018-10-19 VITALS — BP 108/75 | HR 149 | Temp 98.1°F | Wt 264.3 lb

## 2018-10-19 DIAGNOSIS — O26892 Other specified pregnancy related conditions, second trimester: Secondary | ICD-10-CM

## 2018-10-19 DIAGNOSIS — Z331 Pregnant state, incidental: Secondary | ICD-10-CM

## 2018-10-19 DIAGNOSIS — N764 Abscess of vulva: Secondary | ICD-10-CM

## 2018-10-19 DIAGNOSIS — G8929 Other chronic pain: Secondary | ICD-10-CM

## 2018-10-19 DIAGNOSIS — Z3A17 17 weeks gestation of pregnancy: Secondary | ICD-10-CM

## 2018-10-19 DIAGNOSIS — Z1389 Encounter for screening for other disorder: Secondary | ICD-10-CM

## 2018-10-19 DIAGNOSIS — O0992 Supervision of high risk pregnancy, unspecified, second trimester: Secondary | ICD-10-CM

## 2018-10-19 DIAGNOSIS — Z1379 Encounter for other screening for genetic and chromosomal anomalies: Secondary | ICD-10-CM

## 2018-10-19 LAB — POCT URINALYSIS DIPSTICK OB
Blood, UA: NEGATIVE
Glucose, UA: NEGATIVE
Ketones, UA: NEGATIVE
Leukocytes, UA: NEGATIVE
Nitrite, UA: NEGATIVE
POC,PROTEIN,UA: NEGATIVE

## 2018-10-19 MED ORDER — SILVER SULFADIAZINE 1 % EX CREA: TOPICAL_CREAM | CUTANEOUS | 11 refills | Status: DC

## 2018-10-19 MED ORDER — CYCLOBENZAPRINE HCL 10 MG PO TABS: 10.0000 mg | ORAL_TABLET | Freq: Three times a day (TID) | ORAL | 1 refills | Status: DC | PRN

## 2018-10-19 MED ORDER — SULFAMETHOXAZOLE-TRIMETHOPRIM 800-160 MG PO TABS: 1.0000 | ORAL_TABLET | Freq: Two times a day (BID) | ORAL | 1 refills | Status: DC

## 2018-10-19 NOTE — Progress Notes (Signed)
WORK IN VISiT Pt is pregnant but really not specifically a pregnancy visit       Chief Complaint  Patient presents with  . body pain    2nd IT  . boil puberty      23 y.o. [id number] Patient's last menstrual period was 09/05/2017. The current method of family planning is pregnant.  Outpatient Encounter Medications as of 10/19/2018  Medication Sig  . DULoxetine (CYMBALTA) 30 MG capsule Take 1 capsule (30 mg total) by mouth daily.  . prenatal vitamin w/FE, FA (PRENATAL 1 + 1) 27-1 MG TABS tablet Take 1 tablet by mouth daily at 12 noon.  . cyclobenzaprine (FLEXERIL) 10 MG tablet Take 1 tablet (10 mg total) by mouth every 8 (eight) hours as needed for muscle spasms.  . silver sulfADIAZINE (SILVADENE) 1 % cream Apply to area 3-4 times per day  . sulfamethoxazole-trimethoprim (BACTRIM DS) 800-160 MG tablet Take 1 tablet by mouth 2 (two) times daily.   No facility-administered encounter medications on file as of 10/19/2018.     Subjective Pt is seen for widespread upper and lower extremtiy pain, tingling, low back pain, foot hand pain neuropathic symptoms now going on for over a year, worse with pregnancy States she can't pick up her toddler  Also has a boil on her left vulva for 2 days, recurrent painful Not shaving Past Medical History:  Diagnosis Date  . Anxiety   . Asthma    childhood  . Depression    post partum  . Dyspnea   . Headache   . Hypertension   . Irregular menstrual bleeding 01/31/2014  . PTSD (post-traumatic stress disorder)    pTSD  . Recurrent upper respiratory infection (URI)    Bronchitis  . Vaginal Pap smear, abnormal    HPV    Past Surgical History:  Procedure Laterality Date  . CHOLECYSTECTOMY    . CYST REMOVAL NECK    . TYMPANOSTOMY TUBE PLACEMENT    . TYMPANOSTOMY TUBE PLACEMENT      OB History    Gravida  3   Para  2   Term  2   Preterm      AB      Living  2     SAB      TAB      Ectopic      Multiple  0   Live Births   2           No Known Allergies  Social History   Socioeconomic History  . Marital status: Married    Spouse name: Not on file  . Number of children: 2  . Years of education: Not on file  . Highest education level: Not on file  Occupational History  . Not on file  Social Needs  . Financial resource strain: Not on file  . Food insecurity:    Worry: Not on file    Inability: Not on file  . Transportation needs:    Medical: Not on file    Non-medical: Not on file  Tobacco Use  . Smoking status: Current Every Day Smoker    Packs/day: 1.00    Years: 9.00    Pack years: 9.00    Types: Cigarettes  . Smokeless tobacco: Never Used  Substance and Sexual Activity  . Alcohol use: No    Comment: socially  . Drug use: No    Types: Marijuana    Comment: denies use 06/20/14  . Sexual activity: Yes  Birth control/protection: None  Lifestyle  . Physical activity:    Days per week: Not on file    Minutes per session: Not on file  . Stress: Not on file  Relationships  . Social connections:    Talks on phone: Not on file    Gets together: Not on file    Attends religious service: Not on file    Active member of club or organization: Not on file    Attends meetings of clubs or organizations: Not on file    Relationship status: Not on file  Other Topics Concern  . Not on file  Social History Narrative  . Not on file    Family History  Problem Relation Age of Onset  . Bipolar disorder Maternal Grandmother   . Heart disease Maternal Grandfather   . Thyroid disease Paternal Grandmother   . Arthritis Mother   . Fibromyalgia Mother   . Diabetes Mother   . Cancer Mother        vaginal  . Birth defects Father     Medications:       Current Outpatient Medications:  .  DULoxetine (CYMBALTA) 30 MG capsule, Take 1 capsule (30 mg total) by mouth daily., Disp: 30 capsule, Rfl: 1 .  prenatal vitamin w/FE, FA (PRENATAL 1 + 1) 27-1 MG TABS tablet, Take 1 tablet by mouth daily at  12 noon., Disp: 30 each, Rfl: 12 .  cyclobenzaprine (FLEXERIL) 10 MG tablet, Take 1 tablet (10 mg total) by mouth every 8 (eight) hours as needed for muscle spasms., Disp: 30 tablet, Rfl: 1 .  silver sulfADIAZINE (SILVADENE) 1 % cream, Apply to area 3-4 times per day, Disp: 50 g, Rfl: 11 .  sulfamethoxazole-trimethoprim (BACTRIM DS) 800-160 MG tablet, Take 1 tablet by mouth 2 (two) times daily., Disp: 20 tablet, Rfl: 1  Objective Blood pressure 108/75, pulse (!) 149, temperature 98.1 F (36.7 C), weight 264 lb 4.8 oz (119.9 kg), last menstrual period 09/05/2017, not currently breastfeeding.  2 cm left vulvar boil present FHR 165  Pertinent ROS Per HPI  Labs or studies Reviewed [facility] records    Impression Diagnoses this Encounter::   ICD-10-CM   1. Boil, vulva N76.4   2. Other chronic pain G89.29   3. Supervision of high risk pregnancy in second trimester O09.92   4. Pregnant state, incidental Z33.1 POC Urinalysis Dipstick OB  5. Screening for genitourinary condition Z13.89 POC Urinalysis Dipstick OB  6. Genetic screening Z13.79 INTEGRATED 2    Established relevant diagnosis(es):   Plan/Recommendations: Meds ordered this encounter  Medications  . sulfamethoxazole-trimethoprim (BACTRIM DS) 800-160 MG tablet    Sig: Take 1 tablet by mouth 2 (two) times daily.    Dispense:  20 tablet    Refill:  1  . silver sulfADIAZINE (SILVADENE) 1 % cream    Sig: Apply to area 3-4 times per day    Dispense:  50 g    Refill:  11  . cyclobenzaprine (FLEXERIL) 10 MG tablet    Sig: Take 1 tablet (10 mg total) by mouth every 8 (eight) hours as needed for muscle spasms.    Dispense:  30 tablet    Refill:  1    Labs or Scans Ordered: Orders Placed This Encounter  Procedures  . INTEGRATED 2  . 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  Central problem like MS is always in the differential but no history of visual disturbance of more significant neurologic findings, although that will be kept in mind, so don't feel MRI or neuro consult is indicated at this time   I agree with the Cymbalta already started by [facility], their records are reviewed It will be increased to 60 mg at 1 month I will try a trial of flexeril, pt aware we will not be doing any narcotics for this issue and she understands  Follow up Return for keep scheduled appt, follow up for evaluation of this issue with Dr Despina HiddenEure 3 weeks.          All questions were answered.

## 2018-10-21 LAB — INTEGRATED 2
AFP MoM: 1.63
Alpha-Fetoprotein: 35.9 ng/mL
Crown Rump Length: 74.7 mm
DIA MoM: 2.09
DIA Value: 249.3 pg/mL
Estriol, Unconjugated: 0.58 ng/mL
Gest. Age on Collection Date: 13.3 weeks
Gestational Age: 17.3 weeks
Maternal Age at EDD: 25.6 yr
Nuchal Translucency (NT): 2.1 mm
Nuchal Translucency MoM: 1.22
Number of Fetuses: 1
PAPP-A MoM: 0.57
PAPP-A Value: 349.2 ng/mL
Test Results:: NEGATIVE
Weight: 267 [lb_av]
Weight: 267 [lb_av]
hCG MoM: 0.97
hCG Value: 17.7 IU/mL
uE3 MoM: 0.6

## 2018-10-25 ENCOUNTER — Telehealth: Payer: Self-pay | Admitting: *Deleted

## 2018-10-25 ENCOUNTER — Other Ambulatory Visit: Payer: Self-pay | Admitting: Obstetrics and Gynecology

## 2018-10-25 DIAGNOSIS — Z363 Encounter for antenatal screening for malformations: Secondary | ICD-10-CM

## 2018-10-25 NOTE — Telephone Encounter (Signed)
mychart message to patient with restrictions.  

## 2018-10-26 ENCOUNTER — Encounter: Payer: Self-pay | Admitting: Women's Health

## 2018-10-26 ENCOUNTER — Ambulatory Visit (INDEPENDENT_AMBULATORY_CARE_PROVIDER_SITE_OTHER): Payer: BLUE CROSS/BLUE SHIELD

## 2018-10-26 ENCOUNTER — Other Ambulatory Visit: Payer: Self-pay

## 2018-10-26 ENCOUNTER — Ambulatory Visit (INDEPENDENT_AMBULATORY_CARE_PROVIDER_SITE_OTHER): Payer: BLUE CROSS/BLUE SHIELD | Admitting: Women's Health

## 2018-10-26 VITALS — BP 124/74 | HR 91 | Wt 265.0 lb

## 2018-10-26 DIAGNOSIS — O0992 Supervision of high risk pregnancy, unspecified, second trimester: Secondary | ICD-10-CM

## 2018-10-26 DIAGNOSIS — Z363 Encounter for antenatal screening for malformations: Secondary | ICD-10-CM

## 2018-10-26 DIAGNOSIS — O0990 Supervision of high risk pregnancy, unspecified, unspecified trimester: Secondary | ICD-10-CM

## 2018-10-26 DIAGNOSIS — Z331 Pregnant state, incidental: Secondary | ICD-10-CM

## 2018-10-26 DIAGNOSIS — Z1389 Encounter for screening for other disorder: Secondary | ICD-10-CM

## 2018-10-26 DIAGNOSIS — I10 Essential (primary) hypertension: Secondary | ICD-10-CM

## 2018-10-26 DIAGNOSIS — Z3A18 18 weeks gestation of pregnancy: Secondary | ICD-10-CM

## 2018-10-26 DIAGNOSIS — O10919 Unspecified pre-existing hypertension complicating pregnancy, unspecified trimester: Secondary | ICD-10-CM

## 2018-10-26 LAB — POCT URINALYSIS DIPSTICK OB
Blood, UA: NEGATIVE
Glucose, UA: NEGATIVE
Ketones, UA: NEGATIVE
Leukocytes, UA: NEGATIVE
Nitrite, UA: NEGATIVE
POC,PROTEIN,UA: NEGATIVE

## 2018-10-26 NOTE — Patient Instructions (Signed)
Cyndia Skeeters, I greatly value your feedback.  If you receive a survey following your visit with [place] today, we appreciate you taking the time to fill it out.  Thanks, Joellyn Haff, CNM, [facility]  [facility] HAS MOVED!!! It is now [facility] ([address] [place], [place] [address]) Entrance located off of [address] Free 24/7 valet parking     Second Trimester of Pregnancy The second trimester is from week 14 through week 27 (months 4 through 6). The second trimester is often a time when you feel your best. Your body has adjusted to being pregnant, and you begin to feel better physically. Usually, morning sickness has lessened or quit completely, you may have more energy, and you may have an increase in appetite. The second trimester is also a time when the fetus is growing rapidly. At the end of the sixth month, the fetus is about 9 inches long and weighs about 1 pounds. You will likely begin to feel the baby move (quickening) between 16 and 20 weeks of pregnancy. Body changes during your second trimester Your body continues to go through many changes during your second trimester. The changes vary from woman to woman.  Your weight will continue to increase. You will notice your lower abdomen bulging out.  You may begin to get stretch marks on your hips, abdomen, and breasts.  You may develop headaches that can be relieved by medicines. The medicines should be approved by your health care provider.  You may urinate more often because the fetus is pressing on your bladder.  You may develop or continue to have heartburn as a result of your pregnancy.  You may develop constipation because certain hormones are causing the muscles that push waste through your intestines to slow down.  You may develop hemorrhoids or swollen, bulging veins (varicose veins).  You may have back pain. This is caused by: ? Weight gain. ? Pregnancy hormones that are relaxing  the joints in your pelvis. ? A shift in weight and the muscles that support your balance.  Your breasts will continue to grow and they will continue to become tender.  Your gums may bleed and may be sensitive to brushing and flossing.  Dark spots or blotches (chloasma, mask of pregnancy) may develop on your face. This will likely fade after the baby is born.  A dark line from your belly button to the pubic area (linea nigra) may appear. This will likely fade after the baby is born.  You may have changes in your hair. These can include thickening of your hair, rapid growth, and changes in texture. Some women also have hair loss during or after pregnancy, or hair that feels dry or thin. Your hair will most likely return to normal after your baby is born.  What to expect at prenatal visits During a routine prenatal visit:  You will be weighed to make sure you and the fetus are growing normally.  Your blood pressure will be taken.  Your abdomen will be measured to track your baby's growth.  The fetal heartbeat will be listened to.  Any test results from the previous visit will be discussed.  Your health care provider may ask you:  How you are feeling.  If you are feeling the baby move.  If you have had any abnormal symptoms, such as leaking fluid, bleeding, severe headaches, or abdominal cramping.  If you are using any tobacco products, including cigarettes, chewing  tobacco, and electronic cigarettes.  If you have any questions.  Other tests that may be performed during your second trimester include:  Blood tests that check for: ? Low iron levels (anemia). ? High blood sugar that affects pregnant women (gestational diabetes) between 27 and 28 weeks. ? Rh antibodies. This is to check for a protein on red blood cells (Rh factor).  Urine tests to check for infections, diabetes, or protein in the urine.  An ultrasound to confirm the proper growth and development of the baby.   An amniocentesis to check for possible genetic problems.  Fetal screens for spina bifida and Down syndrome.  HIV (human immunodeficiency virus) testing. Routine prenatal testing includes screening for HIV, unless you choose not to have this test.  Follow these instructions at home: Medicines  Follow your health care provider's instructions regarding medicine use. Specific medicines may be either safe or unsafe to take during pregnancy.  Take a prenatal vitamin that contains at least 600 micrograms (mcg) of folic acid.  If you develop constipation, try taking a stool softener if your health care provider approves. Eating and drinking  Eat a balanced diet that includes fresh fruits and vegetables, whole grains, good sources of protein such as meat, eggs, or tofu, and low-fat dairy. Your health care provider will help you determine the amount of weight gain that is right for you.  Avoid raw meat and uncooked cheese. These carry germs that can cause birth defects in the baby.  If you have low calcium intake from food, talk to your health care provider about whether you should take a daily calcium supplement.  Limit foods that are high in fat and processed sugars, such as fried and sweet foods.  To prevent constipation: ? Drink enough fluid to keep your urine clear or pale yellow. ? Eat foods that are high in fiber, such as fresh fruits and vegetables, whole grains, and beans. Activity  Exercise only as directed by your health care provider. Most women can continue their usual exercise routine during pregnancy. Try to exercise for 30 minutes at least 5 days a week. Stop exercising if you experience uterine contractions.  Avoid heavy lifting, wear low heel shoes, and practice good posture.  A sexual relationship may be continued unless your health care provider directs you otherwise. Relieving pain and discomfort  Wear a good support bra to prevent discomfort from breast tenderness.   Take warm sitz baths to soothe any pain or discomfort caused by hemorrhoids. Use hemorrhoid cream if your health care provider approves.  Rest with your legs elevated if you have leg cramps or low back pain.  If you develop varicose veins, wear support hose. Elevate your feet for 15 minutes, 3-4 times a day. Limit salt in your diet. Prenatal Care  Write down your questions. Take them to your prenatal visits.  Keep all your prenatal visits as told by your health care provider. This is important. Safety  Wear your seat belt at all times when driving.  Make a list of emergency phone numbers, including numbers for family, friends, the hospital, and police and fire departments. General instructions  Ask your health care provider for a referral to a local prenatal education class. Begin classes no later than the beginning of month 6 of your pregnancy.  Ask for help if you have counseling or nutritional needs during pregnancy. Your health care provider can offer advice or refer you to specialists for help with various needs.  Do not use  hot tubs, steam rooms, or saunas.  Do not douche or use tampons or scented sanitary pads.  Do not cross your legs for long periods of time.  Avoid cat litter boxes and soil used by cats. These carry germs that can cause birth defects in the baby and possibly loss of the fetus by miscarriage or stillbirth.  Avoid all smoking, herbs, alcohol, and unprescribed drugs. Chemicals in these products can affect the formation and growth of the baby.  Do not use any products that contain nicotine or tobacco, such as cigarettes and e-cigarettes. If you need help quitting, ask your health care provider.  Visit your dentist if you have not gone yet during your pregnancy. Use a soft toothbrush to brush your teeth and be gentle when you floss. Contact a health care provider if:  You have dizziness.  You have mild pelvic cramps, pelvic pressure, or nagging pain in the  abdominal area.  You have persistent nausea, vomiting, or diarrhea.  You have a bad smelling vaginal discharge.  You have pain when you urinate. Get help right away if:  You have a fever.  You are leaking fluid from your vagina.  You have spotting or bleeding from your vagina.  You have severe abdominal cramping or pain.  You have rapid weight gain or weight loss.  You have shortness of breath with chest pain.  You notice sudden or extreme swelling of your face, hands, ankles, feet, or legs.  You have not felt your baby move in over an hour.  You have severe headaches that do not go away when you take medicine.  You have vision changes. Summary  The second trimester is from week 14 through week 27 (months 4 through 6). It is also a time when the fetus is growing rapidly.  Your body goes through many changes during pregnancy. The changes vary from woman to woman.  Avoid all smoking, herbs, alcohol, and unprescribed drugs. These chemicals affect the formation and growth your baby.  Do not use any tobacco products, such as cigarettes, chewing tobacco, and e-cigarettes. If you need help quitting, ask your health care provider.  Contact your health care provider if you have any questions. Keep all prenatal visits as told by your health care provider. This is important. This information is not intended to replace advice given to you by your health care provider. Make sure you discuss any questions you have with your health care provider. Document Released: 05/25/2001 Document Revised: 11/06/2015 Document Reviewed: 08/01/2012 Elsevier Interactive Patient Education  2017 [employer].

## 2018-10-26 NOTE — Progress Notes (Signed)
   HIGH-RISK PREGNANCY VISIT Patient name: [name] MRN [id number]  Date of birth: 1995-12-22 Chief Complaint:   Routine Prenatal Visit ([place] today)  History of Present Illness:   [name] is a 23 y.o. G69P2002 female at [redacted]w[redacted]d with an Estimated Date of Delivery: 03/28/19 being seen today for ongoing management of a high-risk pregnancy complicated by [facility] no meds.  Today she reports no complaints. Contractions: Not present. Vag. Bleeding: None.  Movement: Present. denies leaking of fluid.  Review of Systems:   Pertinent items are noted in HPI Denies abnormal vaginal discharge w/ itching/odor/irritation, headaches, visual changes, shortness of breath, chest pain, abdominal pain, severe nausea/vomiting, or problems with urination or bowel movements unless otherwise stated above. Pertinent History Reviewed:  Reviewed past medical,surgical, social, obstetrical and family history.  Reviewed problem list, medications and allergies. Physical Assessment:   Vitals:   10/26/18 1045  BP: 124/74  Pulse: 91  Weight: 265 lb (120.2 kg)  Body mass index is 41.5 kg/m.           Physical Examination:   General appearance: alert, well appearing, and in no distress  Mental status: alert, oriented to person, place, and time  Skin: warm & dry   Extremities: Edema: None    Cardiovascular: normal heart rate noted  Respiratory: normal respiratory effort, no distress  Abdomen: gravid, soft, non-tender  Pelvic: Cervical exam deferred         Fetal Status: Fetal Heart Rate (bpm): 164 u/s   Movement: Present    Fetal Surveillance Testing today: [place] 18+1 wks,breech,cx 3 cm,normal right ovary,simple left ovarian cyst 4.5 x 2.6 x 4.6 cm,svp of fluid 3.6 cm,fhr 164 bpm,efw 267 g 88.5%,anatomy complete,no obvious abnormalities   Results for orders placed or performed in visit on 10/26/18 (from the past 24 hour(s))  POC Urinalysis Dipstick OB   Collection Time: 10/26/18 10:50 AM  Result Value Ref Range    Color, UA     Clarity, UA     Glucose, UA Negative Negative   Bilirubin, UA     Ketones, UA neg    Spec Grav, UA     Blood, UA neg    pH, UA     POC,PROTEIN,UA Negative Negative, Trace, Small (1+), Moderate (2+), Large (3+), 4+   Urobilinogen, UA     Nitrite, UA neg    Leukocytes, UA Negative Negative   Appearance     Odor      Assessment & Plan:  1) High-risk pregnancy G3P2002 at [redacted]w[redacted]d with an Estimated Date of Delivery: 03/28/19   2) CHTN, stable, no meds, hasn't started ASA, didn't feel that bp was up so didn't see point of taking, discussed ASA for preventing pre-e, not lowering bp, will consider  Meds: No orders of the defined types were placed in this encounter.  Labs/procedures today: anatomy u/s  Treatment Plan:   Growth u/s @  24, 28, 33, 36wks     2x/wk testing nst/sono @ 36wks or weekly BPP   Deliver @ 39-40wks:______   Reviewed: Preterm labor symptoms and general obstetric precautions including but not limited to vaginal bleeding, contractions, leaking of fluid and fetal movement were reviewed in detail with the patient.  All questions were answered.  Follow-up: Return in about 4 weeks (around 11/23/2018) for HROB, US:EFW.  Orders Placed This Encounter  Procedures  . POC Urinalysis Dipstick OB   Cheral Marker CNM, [facility] 10/26/2018 11:42 AM

## 2018-10-26 NOTE — Progress Notes (Signed)
[place] 18+1 wks,breech,cx 3 cm,normal right ovary,simple left ovarian cyst 4.5 x 2.6 x 4.6 cm,svp of fluid 3.6 cm,fhr 164 bpm,efw 267 g 88.5%,anatomy complete,no obvious abnormalities

## 2018-11-07 ENCOUNTER — Other Ambulatory Visit: Payer: Self-pay | Admitting: Obstetrics & Gynecology

## 2018-11-07 MED ORDER — CYCLOBENZAPRINE HCL 10 MG PO TABS: 10.0000 mg | ORAL_TABLET | Freq: Three times a day (TID) | ORAL | 1 refills | Status: DC | PRN

## 2018-11-09 ENCOUNTER — Encounter: Payer: BLUE CROSS/BLUE SHIELD | Admitting: Obstetrics & Gynecology

## 2018-11-19 DIAGNOSIS — Z20828 Contact with and (suspected) exposure to other viral communicable diseases: Secondary | ICD-10-CM | POA: Diagnosis not present

## 2018-11-19 DIAGNOSIS — U071 COVID-19: Secondary | ICD-10-CM | POA: Diagnosis not present

## 2018-11-23 ENCOUNTER — Encounter: Payer: Self-pay | Admitting: *Deleted

## 2018-11-24 ENCOUNTER — Ambulatory Visit (INDEPENDENT_AMBULATORY_CARE_PROVIDER_SITE_OTHER): Payer: BC Managed Care – PPO | Admitting: Obstetrics & Gynecology

## 2018-11-24 ENCOUNTER — Ambulatory Visit (INDEPENDENT_AMBULATORY_CARE_PROVIDER_SITE_OTHER): Payer: BC Managed Care – PPO

## 2018-11-24 ENCOUNTER — Encounter: Payer: Self-pay | Admitting: Obstetrics & Gynecology

## 2018-11-24 ENCOUNTER — Other Ambulatory Visit: Payer: Self-pay

## 2018-11-24 VITALS — BP 129/83 | HR 113 | Wt 259.0 lb

## 2018-11-24 DIAGNOSIS — Z1389 Encounter for screening for other disorder: Secondary | ICD-10-CM

## 2018-11-24 DIAGNOSIS — O10919 Unspecified pre-existing hypertension complicating pregnancy, unspecified trimester: Secondary | ICD-10-CM

## 2018-11-24 DIAGNOSIS — O0992 Supervision of high risk pregnancy, unspecified, second trimester: Secondary | ICD-10-CM | POA: Diagnosis not present

## 2018-11-24 DIAGNOSIS — Z3A22 22 weeks gestation of pregnancy: Secondary | ICD-10-CM

## 2018-11-24 DIAGNOSIS — O10912 Unspecified pre-existing hypertension complicating pregnancy, second trimester: Secondary | ICD-10-CM

## 2018-11-24 DIAGNOSIS — Z331 Pregnant state, incidental: Secondary | ICD-10-CM

## 2018-11-24 DIAGNOSIS — O0990 Supervision of high risk pregnancy, unspecified, unspecified trimester: Secondary | ICD-10-CM

## 2018-11-24 LAB — POCT URINALYSIS DIPSTICK OB
Blood, UA: NEGATIVE
Glucose, UA: NEGATIVE
Ketones, UA: NEGATIVE
Leukocytes, UA: NEGATIVE
Nitrite, UA: NEGATIVE

## 2018-11-24 NOTE — Progress Notes (Signed)
Mucous discharge last pm.

## 2018-11-24 NOTE — Progress Notes (Signed)
Patient ID: [name], female   DOB: [birth year], 23 y.o.   MRN: [id number]   [facility] PREGNANCY VISIT Patient name: [name] MRN [id number]  Date of birth: Aug 05, 1995 Chief Complaint:   High Risk Gestation ([place] today; mucus discharge)  History of Present Illness:   [name] is a 23 y.o. G47P2002 female at [redacted]w[redacted]d with an Estimated Date of Delivery: 03/28/19 being seen today for ongoing management of a high-risk pregnancy complicated by chronic HTN.  Today she reports backache. Contractions: Not present. Vag. Bleeding: None.  Movement: Present. denies leaking of fluid.  Review of Systems:   Pertinent items are noted in HPI Denies abnormal vaginal discharge w/ itching/odor/irritation, headaches, visual changes, shortness of breath, chest pain, abdominal pain, severe nausea/vomiting, or problems with urination or bowel movements unless otherwise stated above. Pertinent History Reviewed:  Reviewed past medical,surgical, social, obstetrical and family history.  Reviewed problem list, medications and allergies. Physical Assessment:   Vitals:   11/24/18 0956  BP: 129/83  Pulse: (!) 113  Weight: 259 lb (117.5 kg)  Body mass index is 40.57 kg/m.           Physical Examination:   General appearance: alert, well appearing, and in no distress  Mental status: alert, oriented to person, place, and time  Skin: warm & dry   Extremities: Edema: Trace    Cardiovascular: normal heart rate noted  Respiratory: normal respiratory effort, no distress  Abdomen: gravid, soft, non-tender  Pelvic: Cervical exam deferred         Fetal Status:     Movement: Present    Fetal Surveillance Testing today: FHR 156   No results found for this or any previous visit (from the past 24 hour(s)).  Assessment & Plan:  1) High-risk pregnancy G3P2002 at [redacted]w[redacted]d with an Estimated Date of Delivery: 03/28/19   2) CHTN, no meds needed at this point, normal sonogram growth and findings stable left ov cyst,    Ongoing back and leg issues, no evidence of symptoms which would suggest discopathy, have suggested stretching, prenatal yoga and prenatal cradle, I prescribed flexeril previously which is helping according to patient, pt is already on cymbalta  SSE some mucous, no evidence of yeast or BV  Meds: No orders of the defined types were placed in this encounter.   Labs/procedures today: sonogram  Treatment Plan:  3 weeks webex, 6 weeks in office with PN2 + sonogram  Reviewed:  labor symptoms and general obstetric precautions including but not limited to vaginal bleeding, contractions, leaking of fluid and fetal movement were reviewed in detail with the patient.  All questions were answered.  Follow-up: No follow-ups on file.  Orders Placed This Encounter  Procedures  . POC Urinalysis Dipstick OB   Florian Buff  11/24/2018 10:22 AM

## 2018-11-24 NOTE — Progress Notes (Signed)
[place] 34+0 wks,cephalic,anterior placenta gr 0,normal right ovary,simple left ovarian cyst 4.1 x 3.2 x 2.1 cm,cx 4.9 cm,svp of fluid 4.6 cm,fhr 146 bpm,efw 554 g 78%

## 2018-11-28 ENCOUNTER — Ambulatory Visit (HOSPITAL_COMMUNITY): Payer: BLUE CROSS/BLUE SHIELD | Admitting: Psychiatry

## 2018-11-28 ENCOUNTER — Encounter: Payer: Self-pay | Admitting: *Deleted

## 2018-11-28 DIAGNOSIS — Z6841 Body Mass Index (BMI) 40.0 and over, adult: Secondary | ICD-10-CM | POA: Diagnosis not present

## 2018-11-28 DIAGNOSIS — J209 Acute bronchitis, unspecified: Secondary | ICD-10-CM | POA: Diagnosis not present

## 2018-11-28 DIAGNOSIS — J019 Acute sinusitis, unspecified: Secondary | ICD-10-CM | POA: Diagnosis not present
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
## 2018-11-30 ENCOUNTER — Encounter (HOSPITAL_COMMUNITY): Payer: Self-pay | Admitting: Psychiatry

## 2018-11-30 ENCOUNTER — Other Ambulatory Visit: Payer: Self-pay

## 2018-11-30 ENCOUNTER — Ambulatory Visit (INDEPENDENT_AMBULATORY_CARE_PROVIDER_SITE_OTHER): Payer: BC Managed Care – PPO | Admitting: Psychiatry

## 2018-11-30 DIAGNOSIS — F4310 Post-traumatic stress disorder, unspecified: Secondary | ICD-10-CM | POA: Diagnosis not present

## 2018-11-30 DIAGNOSIS — F331 Major depressive disorder, recurrent, moderate: Secondary | ICD-10-CM

## 2018-11-30 MED ORDER — DULOXETINE HCL 60 MG PO CPEP: 60.0000 mg | ORAL_CAPSULE | Freq: Every day | ORAL | 0 refills | Status: DC

## 2018-11-30 NOTE — Patient Instructions (Signed)
1.  Increase duloxetine 60 mg daily  3.  Next appointment 8/13 at 11 AM

## 2018-12-03 ENCOUNTER — Encounter (HOSPITAL_COMMUNITY): Payer: Self-pay

## 2018-12-03 ENCOUNTER — Other Ambulatory Visit: Payer: Self-pay

## 2018-12-03 ENCOUNTER — Inpatient Hospital Stay (HOSPITAL_COMMUNITY)
Admission: AD | Admit: 2018-12-03 | Discharge: 2018-12-04 | DRG: 833 | Disposition: A | Discharge status: Home / Self Care | Payer: BC Managed Care – PPO | Attending: Obstetrics & Gynecology | Admitting: Obstetrics & Gynecology

## 2018-12-03 DIAGNOSIS — O26899 Other specified pregnancy related conditions, unspecified trimester: Secondary | ICD-10-CM

## 2018-12-03 DIAGNOSIS — F329 Major depressive disorder, single episode, unspecified: Secondary | ICD-10-CM | POA: Diagnosis not present

## 2018-12-03 DIAGNOSIS — O99342 Other mental disorders complicating pregnancy, second trimester: Secondary | ICD-10-CM | POA: Diagnosis present

## 2018-12-03 DIAGNOSIS — O99332 Smoking (tobacco) complicating pregnancy, second trimester: Secondary | ICD-10-CM | POA: Diagnosis present

## 2018-12-03 DIAGNOSIS — Z1159 Encounter for screening for other viral diseases: Secondary | ICD-10-CM

## 2018-12-03 DIAGNOSIS — O9989 Other specified diseases and conditions complicating pregnancy, childbirth and the puerperium: Secondary | ICD-10-CM | POA: Diagnosis not present

## 2018-12-03 DIAGNOSIS — F17210 Nicotine dependence, cigarettes, uncomplicated: Secondary | ICD-10-CM | POA: Diagnosis present

## 2018-12-03 DIAGNOSIS — O42112 Preterm premature rupture of membranes, onset of labor more than 24 hours following rupture, second trimester: Secondary | ICD-10-CM | POA: Diagnosis present

## 2018-12-03 DIAGNOSIS — Z8249 Family history of ischemic heart disease and other diseases of the circulatory system: Secondary | ICD-10-CM

## 2018-12-03 DIAGNOSIS — Z6791 Unspecified blood type, Rh negative: Secondary | ICD-10-CM

## 2018-12-03 DIAGNOSIS — Z363 Encounter for antenatal screening for malformations: Secondary | ICD-10-CM | POA: Diagnosis not present

## 2018-12-03 DIAGNOSIS — O42913 Preterm premature rupture of membranes, unspecified as to length of time between rupture and onset of labor, third trimester: Secondary | ICD-10-CM | POA: Diagnosis not present

## 2018-12-03 DIAGNOSIS — O10919 Unspecified pre-existing hypertension complicating pregnancy, unspecified trimester: Secondary | ICD-10-CM | POA: Diagnosis present

## 2018-12-03 DIAGNOSIS — Z3689 Encounter for other specified antenatal screening: Secondary | ICD-10-CM | POA: Diagnosis not present

## 2018-12-03 DIAGNOSIS — O42912 Preterm premature rupture of membranes, unspecified as to length of time between rupture and onset of labor, second trimester: Secondary | ICD-10-CM

## 2018-12-03 DIAGNOSIS — Z833 Family history of diabetes mellitus: Secondary | ICD-10-CM

## 2018-12-03 DIAGNOSIS — Z3A23 23 weeks gestation of pregnancy: Secondary | ICD-10-CM

## 2018-12-03 DIAGNOSIS — O26892 Other specified pregnancy related conditions, second trimester: Secondary | ICD-10-CM | POA: Diagnosis not present

## 2018-12-03 DIAGNOSIS — Z8261 Family history of arthritis: Secondary | ICD-10-CM | POA: Diagnosis not present

## 2018-12-03 DIAGNOSIS — Z8759 Personal history of other complications of pregnancy, childbirth and the puerperium: Secondary | ICD-10-CM

## 2018-12-03 DIAGNOSIS — O0990 Supervision of high risk pregnancy, unspecified, unspecified trimester: Secondary | ICD-10-CM

## 2018-12-03 NOTE — MAU Note (Signed)
For 3 days, having watery discharge, put on a pad today and could feel it coming out then saw a bright red spots when wiped at 9 pm.  No pain. Feeling baby move. Reports has high blood pressure with pregnancy and last.  No bp medication.

## 2018-12-03 NOTE — MAU Provider Note (Addendum)
History     CSN: [id number]  Arrival date and time: 12/03/18 2258   First Provider Initiated Contact with Patient 12/03/18 2355      Chief Complaint  Patient presents with  . Rupture of Membranes   [name] is a 23 y.o. G3P2002 at [redacted]w[redacted]d who receives care at [facility].  She presents today for Rupture of Membranes.  She states for the last 3 days she has been having watery discharge.   She states she has felt gushes and notices that the pad was wet without odor and color.  She states she also noted some light blood today. She states she told her mother and she encouraged her to put on a pad.  She denies pain and endorses fetal movement.  She denies sexual activity in the last 3 days or other vaginal discharge prior to her perceived leaking.       OB History    Gravida  3   Para  2   Term  2   Preterm      AB      Living  2     SAB      TAB      Ectopic      Multiple  0   Live Births  2           Past Medical History:  Diagnosis Date  . Anxiety   . Asthma    childhood  . Depression    post partum  . Dyspnea   . Headache   . Hypertension   . Irregular menstrual bleeding 01/31/2014  . PTSD (post-traumatic stress disorder)    pTSD  . Recurrent upper respiratory infection (URI)    Bronchitis  . Vaginal Pap smear, abnormal    HPV    Past Surgical History:  Procedure Laterality Date  . CHOLECYSTECTOMY    . CYST REMOVAL NECK    . TYMPANOSTOMY TUBE PLACEMENT    . TYMPANOSTOMY TUBE PLACEMENT      Family History  Problem Relation Age of Onset  . Bipolar disorder Maternal Grandmother   . Heart disease Maternal Grandfather   . Thyroid disease Paternal Grandmother   . Arthritis Mother   . Fibromyalgia Mother   . Diabetes Mother   . Cancer Mother        vaginal  . Birth defects Father     Social History   Tobacco Use  . Smoking status: Current Every Day Smoker    Packs/day: 1.00    Years: 9.00    Pack years: 9.00    Types:  Cigarettes  . Smokeless tobacco: Never Used  Substance Use Topics  . Alcohol use: No    Comment: socially  . Drug use: No    Types: Marijuana    Comment: denies use 06/20/14    Allergies: No Known Allergies  Medications Prior to Admission  Medication Sig Dispense Refill Last Dose  . cyclobenzaprine (FLEXERIL) 10 MG tablet Take 1 tablet (10 mg total) by mouth every 8 (eight) hours as needed for muscle spasms. 30 tablet 1 12/03/2018 at Unknown time  . DULoxetine (CYMBALTA) 60 MG capsule Take 1 capsule (60 mg total) by mouth daily. 90 capsule 0 12/02/2018 at Unknown time  . prenatal vitamin w/FE, FA (PRENATAL 1 + 1) 27-1 MG TABS tablet Take 1 tablet by mouth daily at 12 noon. 30 each 12 Past Month at Unknown time    Review of Systems  Constitutional: Negative for chills and  fever.  Respiratory: Negative for cough and shortness of breath.   Gastrointestinal: Negative for abdominal pain, constipation, diarrhea, nausea and vomiting.  Genitourinary: Negative for difficulty urinating, dysuria, vaginal bleeding and vaginal discharge.  Neurological: Negative for dizziness, light-headedness and headaches.   Physical Exam   Blood pressure 132/73, pulse (!) 106, temperature 98 F (36.7 C), temperature source Oral, resp. rate 18, height 5\' 7"  (1.702 m), weight 117.5 kg, last menstrual period 09/05/2017, SpO2 98 %, not currently breastfeeding.  Physical Exam  Constitutional: She is oriented to person, place, and time. She appears well-developed and well-nourished.  HENT:  Head: Normocephalic and atraumatic.  Eyes: Conjunctivae are normal.  Neck: Normal range of motion.  Cardiovascular: Normal rate.  Respiratory: Effort normal.  GI: Soft.  Genitourinary: Cervix exhibits no motion tenderness, no discharge and no friability.    No vaginal discharge or bleeding.  No bleeding in the vagina.    Genitourinary Comments: Sterile Speculum Exam: -Vaginal Vault: Pink mucosa.  Small amt mucoid discharge at  anterior aspect of cervix.  No pooling -wet prep and fern collected -Cervix:Pink, no lesions, cysts, or polyps.  Appears closed. No active bleeding or leaking from os-GC/CT collected -Bimanual Exam: Closed Internal Os/Long   Musculoskeletal: Normal range of motion.  Neurological: She is alert and oriented to person, place, and time.  Skin: Skin is warm and dry.  Psychiatric: She has a normal mood and affect. Her behavior is normal.    Fetal Assessment 150 bpm, Mod Var, -Decels, +Accels Toco: None  MAU Course   Results for orders placed or performed during the hospital encounter of 12/03/18 (from the past 24 hour(s))  Urinalysis, Routine w reflex microscopic     Status: Abnormal   Collection Time: 12/03/18 11:50 PM  Result Value Ref Range   Color, Urine YELLOW YELLOW   APPearance CLOUDY (A) CLEAR   Specific Gravity, Urine 1.020 1.005 - 1.030   pH 6.0 5.0 - 8.0   Glucose, UA 50 (A) NEGATIVE mg/dL   Hgb urine dipstick NEGATIVE NEGATIVE   Bilirubin Urine NEGATIVE NEGATIVE   Ketones, ur NEGATIVE NEGATIVE mg/dL   Protein, ur NEGATIVE NEGATIVE mg/dL   Nitrite NEGATIVE NEGATIVE   Leukocytes,Ua NEGATIVE NEGATIVE  Wet prep, genital     Status: None   Collection Time: 12/04/18 12:15 AM   Specimen: Thin Prep Cervical/Endocervical  Result Value Ref Range   Yeast Wet Prep HPF POC NONE SEEN NONE SEEN   Trich, Wet Prep NONE SEEN NONE SEEN   Clue Cells Wet Prep HPF POC NONE SEEN NONE SEEN   WBC, Wet Prep HPF POC NONE SEEN NONE SEEN   Sperm NONE SEEN   Fern Test     Status: None   Collection Time: 12/04/18 12:28 AM  Result Value Ref Range   POCT Fern Test Positive = ruptured amniotic membanes   CBC     Status: Abnormal   Collection Time: 12/04/18 12:55 AM  Result Value Ref Range   WBC 14.9 (H) 4.0 - 10.5 K/uL   RBC 3.85 (L) 3.87 - 5.11 MIL/uL   Hemoglobin 11.6 (L) 12.0 - 15.0 g/dL   HCT 16.134.0 (L) 09.636.0 - 04.546.0 %   MCV 88.3 80.0 - 100.0 fL   MCH 30.1 26.0 - 34.0 pg   MCHC 34.1 30.0 -  36.0 g/dL   RDW 40.913.8 81.111.5 - 91.415.5 %   Platelets 200 150 - 400 K/uL   nRBC 0.0 0.0 - 0.2 %  SARS Coronavirus 2 (CEPHEID -  Performed in [facility]), Hosp Order     Status: None   Collection Time: 12/04/18 12:56 AM   Specimen: Nasopharyngeal Swab  Result Value Ref Range   SARS Coronavirus 2 NEGATIVE NEGATIVE  Amnisure rupture of membrane (rom)not [facility]     Status: None   Collection Time: 12/04/18  1:50 AM  Result Value Ref Range   Amnisure ROM NEGATIVE    No results found.  MDM PE Labs: Richelle ItoFern, Amnisure [facility] and Plan  23 year old G3P2002  SIUP at 23.4weeks Cat I FT Vaginal Leakage  -Exam findings discussed. -Crist FatFern returns positive. -Dr. Macon LargeAnyanwu consulted and informed of patient status. *Admit to OBSCU with standard orders including latency antibiotics and steroids. *Advises limited US for evaluation of AFI. -Patient to be sent for US prior to transfer to unit.  Follow Up (1:55 AM) AFV normal with largest pocket 8.61 cm  -Dr. Macon LargeAnyanwu suggests and orders amnisure. -Admission pending results. -Initial dose already BMZ given. -CoVid test complete and pending.   Follow Up (2:50 AM)  -Amnisure returns negative. No further leaking of fluid noted while in MAU. Dr. Macon LargeAnyanwu to bedside to discuss. -Patient expresses desire for outpatient management and declines overnight observation. -[facility] -Encouraged to call or return to MAU if symptoms worsen or with the onset of new symptoms. -Discharged to home in stable condition  Cherre RobinsJessica L Emly MSN, CNM 12/03/2018, 11:55 PM    Attestation of Attending Supervision of Advanced Practice Provider (PA/CNM/NP): Evaluation and management procedures were performed by the Advanced Practice Provider under my supervision and collaboration.  I have reviewed the Advanced Practice Provider's note and chart, and I agree with the management and plan.  Given patient's story and  initial fern test results, there was a concern about PPROM but U/S with normal AFV and negative Amnisure were against this diagnosis.  However, patient was offered to be admitted for brief observation given equivocal testing, but patient declined this. [facility]. Message sent to FT staff and MD in charge. Strict LOF, PTL, FM precautions reviewed with patient.   Jaynie CollinsUGONNA  Javan Gonzaga, MD, FACOG Obstetrician & Gynecologist, [facility], [facility]

## 2018-12-03 NOTE — ED Triage Notes (Signed)
Pt in POV, states she is [redacted] weeks pregnant. She reports "fluid leaking" from her vagina. Report called to MAU. Pt taken by transport.

## 2018-12-04 ENCOUNTER — Encounter (HOSPITAL_COMMUNITY): Payer: Self-pay | Admitting: Obstetrics & Gynecology

## 2018-12-04 ENCOUNTER — Ambulatory Visit (INDEPENDENT_AMBULATORY_CARE_PROVIDER_SITE_OTHER): Payer: BC Managed Care – PPO | Admitting: Women's Health

## 2018-12-04 ENCOUNTER — Encounter: Payer: Self-pay | Admitting: Women's Health

## 2018-12-04 ENCOUNTER — Telehealth: Payer: Self-pay | Admitting: *Deleted

## 2018-12-04 ENCOUNTER — Inpatient Hospital Stay (HOSPITAL_COMMUNITY): Payer: BC Managed Care – PPO

## 2018-12-04 VITALS — BP 121/74 | HR 138 | Wt 257.2 lb

## 2018-12-04 DIAGNOSIS — Z8249 Family history of ischemic heart disease and other diseases of the circulatory system: Secondary | ICD-10-CM | POA: Diagnosis not present

## 2018-12-04 DIAGNOSIS — O0990 Supervision of high risk pregnancy, unspecified, unspecified trimester: Secondary | ICD-10-CM

## 2018-12-04 DIAGNOSIS — O10912 Unspecified pre-existing hypertension complicating pregnancy, second trimester: Secondary | ICD-10-CM

## 2018-12-04 DIAGNOSIS — Z363 Encounter for antenatal screening for malformations: Secondary | ICD-10-CM | POA: Diagnosis not present

## 2018-12-04 DIAGNOSIS — O42913 Preterm premature rupture of membranes, unspecified as to length of time between rupture and onset of labor, third trimester: Secondary | ICD-10-CM | POA: Diagnosis not present

## 2018-12-04 DIAGNOSIS — Z3A23 23 weeks gestation of pregnancy: Secondary | ICD-10-CM | POA: Diagnosis not present

## 2018-12-04 DIAGNOSIS — Z3689 Encounter for other specified antenatal screening: Secondary | ICD-10-CM

## 2018-12-04 DIAGNOSIS — O9989 Other specified diseases and conditions complicating pregnancy, childbirth and the puerperium: Secondary | ICD-10-CM | POA: Diagnosis present

## 2018-12-04 DIAGNOSIS — O10919 Unspecified pre-existing hypertension complicating pregnancy, unspecified trimester: Secondary | ICD-10-CM

## 2018-12-04 DIAGNOSIS — N898 Other specified noninflammatory disorders of vagina: Secondary | ICD-10-CM

## 2018-12-04 DIAGNOSIS — O0992 Supervision of high risk pregnancy, unspecified, second trimester: Secondary | ICD-10-CM

## 2018-12-04 DIAGNOSIS — Z1159 Encounter for screening for other viral diseases: Secondary | ICD-10-CM | POA: Diagnosis not present

## 2018-12-04 DIAGNOSIS — O26892 Other specified pregnancy related conditions, second trimester: Secondary | ICD-10-CM | POA: Diagnosis present

## 2018-12-04 DIAGNOSIS — Z1389 Encounter for screening for other disorder: Secondary | ICD-10-CM

## 2018-12-04 DIAGNOSIS — O99332 Smoking (tobacco) complicating pregnancy, second trimester: Secondary | ICD-10-CM | POA: Diagnosis present

## 2018-12-04 DIAGNOSIS — F17210 Nicotine dependence, cigarettes, uncomplicated: Secondary | ICD-10-CM | POA: Diagnosis present

## 2018-12-04 DIAGNOSIS — Z331 Pregnant state, incidental: Secondary | ICD-10-CM

## 2018-12-04 DIAGNOSIS — O42112 Preterm premature rupture of membranes, onset of labor more than 24 hours following rupture, second trimester: Secondary | ICD-10-CM | POA: Diagnosis present

## 2018-12-04 DIAGNOSIS — Z833 Family history of diabetes mellitus: Secondary | ICD-10-CM | POA: Diagnosis not present

## 2018-12-04 DIAGNOSIS — O99342 Other mental disorders complicating pregnancy, second trimester: Secondary | ICD-10-CM | POA: Diagnosis present

## 2018-12-04 DIAGNOSIS — F329 Major depressive disorder, single episode, unspecified: Secondary | ICD-10-CM | POA: Diagnosis present

## 2018-12-04 DIAGNOSIS — Z8261 Family history of arthritis: Secondary | ICD-10-CM | POA: Diagnosis not present

## 2018-12-04 LAB — WET PREP, GENITAL
Clue Cells Wet Prep HPF POC: NONE SEEN
Sperm: NONE SEEN
Trich, Wet Prep: NONE SEEN
WBC, Wet Prep HPF POC: NONE SEEN
Yeast Wet Prep HPF POC: NONE SEEN

## 2018-12-04 LAB — POCT URINALYSIS DIPSTICK OB
Blood, UA: NEGATIVE
Leukocytes, UA: NEGATIVE
Nitrite, UA: NEGATIVE

## 2018-12-04 LAB — SARS CORONAVIRUS 2 BY RT PCR (HOSPITAL ORDER, PERFORMED IN ~~LOC~~ HOSPITAL LAB): SARS Coronavirus 2: NEGATIVE

## 2018-12-04 LAB — URINALYSIS, ROUTINE W REFLEX MICROSCOPIC
Bilirubin Urine: NEGATIVE
Glucose, UA: 50 mg/dL — AB
Hgb urine dipstick: NEGATIVE
Ketones, ur: NEGATIVE mg/dL
Leukocytes,Ua: NEGATIVE
Nitrite: NEGATIVE
Protein, ur: NEGATIVE mg/dL
Specific Gravity, Urine: 1.02 (ref 1.005–1.030)
pH: 6 (ref 5.0–8.0)

## 2018-12-04 LAB — CBC
HCT: 34 % — ABNORMAL LOW (ref 36.0–46.0)
Hemoglobin: 11.6 g/dL — ABNORMAL LOW (ref 12.0–15.0)
MCH: 30.1 pg (ref 26.0–34.0)
MCHC: 34.1 g/dL (ref 30.0–36.0)
MCV: 88.3 fL (ref 80.0–100.0)
Platelets: 200 10*3/uL (ref 150–400)
RBC: 3.85 MIL/uL — ABNORMAL LOW (ref 3.87–5.11)
RDW: 13.8 % (ref 11.5–15.5)
WBC: 14.9 10*3/uL — ABNORMAL HIGH (ref 4.0–10.5)
nRBC: 0 % (ref 0.0–0.2)

## 2018-12-04 LAB — GC/CHLAMYDIA PROBE AMP (~~LOC~~) NOT AT ARMC
Chlamydia: NEGATIVE
Neisseria Gonorrhea: NEGATIVE

## 2018-12-04 LAB — POCT FERN TEST: POCT Fern Test: POSITIVE

## 2018-12-04 LAB — AMNISURE RUPTURE OF MEMBRANE (ROM) NOT AT ARMC: Amnisure ROM: NEGATIVE

## 2018-12-04 MED ORDER — SODIUM CHLORIDE 0.9 % IV SOLN: 2.0000 g | Freq: Four times a day (QID) | INTRAVENOUS | Status: DC

## 2018-12-04 MED ORDER — AMOXICILLIN 500 MG PO CAPS: 500.0000 mg | ORAL_CAPSULE | Freq: Three times a day (TID) | ORAL | Status: DC

## 2018-12-04 MED ORDER — CYCLOBENZAPRINE HCL 10 MG PO TABS: 10.0000 mg | ORAL_TABLET | Freq: Three times a day (TID) | ORAL | Status: DC | PRN

## 2018-12-04 MED ORDER — ACETAMINOPHEN 325 MG PO TABS: 650.0000 mg | ORAL_TABLET | ORAL | Status: DC | PRN

## 2018-12-04 MED ORDER — ZOLPIDEM TARTRATE 5 MG PO TABS: 5.0000 mg | ORAL_TABLET | Freq: Every evening | ORAL | Status: DC | PRN

## 2018-12-04 MED ORDER — AZITHROMYCIN 250 MG PO TABS: 1000.0000 mg | ORAL_TABLET | Freq: Once | ORAL | Status: DC

## 2018-12-04 MED ORDER — PRENATAL MULTIVITAMIN CH: 1.0000 | ORAL_TABLET | Freq: Every day | ORAL | Status: DC

## 2018-12-04 MED ORDER — BETAMETHASONE SOD PHOS & ACET 6 (3-3) MG/ML IJ SUSP
12.0000 mg | INTRAMUSCULAR | Status: DC
  Administered 2018-12-04: 12 mg via INTRAMUSCULAR
  Filled 2018-12-04: qty 2

## 2018-12-04 MED ORDER — DULOXETINE HCL 60 MG PO CPEP: 60.0000 mg | ORAL_CAPSULE | Freq: Every day | ORAL | Status: DC

## 2018-12-04 MED ORDER — CALCIUM CARBONATE ANTACID 500 MG PO CHEW: 2.0000 | CHEWABLE_TABLET | ORAL | Status: DC | PRN

## 2018-12-04 MED ORDER — DOCUSATE SODIUM 100 MG PO CAPS: 100.0000 mg | ORAL_CAPSULE | Freq: Every day | ORAL | Status: DC

## 2018-12-04 NOTE — Telephone Encounter (Signed)
Pt states that she went to hospital last night and they did a test and said her water broke, then did another and said it hadn't. She states they repeated it one more time and said that it did but that she needs to make and appt with the office today to see if her water is leaking. Please advise.

## 2018-12-04 NOTE — Progress Notes (Signed)
Work-in HIGH-RISK PREGNANCY VISIT Patient name: [name] MRN [id number]  Date of birth: 27-Aug-1995 Chief Complaint:   work-in-ob (possible water leakage)  History of Present Illness:   [name] is a 23 y.o. 613P2002 female at [redacted]w[redacted]d with an Estimated Date of Delivery: 03/28/19 being seen today for ongoing management of a high-risk pregnancy [facility].  Today she is being seen as a work-in for f/u from MAU visit last night. Reports intermittent leakage of fluid x 4d, put pad on yesterday afternoon to see if she could tell if was d/c or fluid, looked more like fluid, no odor, could tell if was definitely from vagina and not urethra, so went to MAU. Per MAU note no pooling, fern was positive. U/s largest pocket 8.61cm, amnisure neg, given equivocal testing she was offered brief obs- pt declined. Wet prep was neg. Reports she has continued to intermittently leak. No contractions, vb. Baby moving well.   Contractions: Not present.  .  Movement: Present. denies leaking of fluid.  Review of Systems:   Pertinent items are noted in HPI Denies abnormal vaginal discharge w/ itching/odor/irritation, headaches, visual changes, shortness of breath, chest pain, abdominal pain, severe nausea/vomiting, or problems with urination or bowel movements unless otherwise stated above. Pertinent History Reviewed:  Reviewed past medical,surgical, social, obstetrical and family history.  Reviewed problem list, medications and allergies. Physical Assessment:   Vitals:   12/04/18 1529  BP: 121/74  Pulse: (!) 138  Weight: 257 lb 3.2 oz (116.7 kg)  Body mass index is 40.28 kg/m.           Physical Examination:   General appearance: alert, well appearing, and in no distress  Mental status: alert, oriented to person, place, and time  Skin: warm & dry   Extremities: Edema: None    Cardiovascular: normal heart rate noted  Respiratory: normal respiratory effort, no distress  Abdomen: gravid,  soft, non-tender  Pelvic: SSE: cx visually closed, no pooling, no change w/ valsalva, fern & nitrazine neg         Fetal Status: Fetal Heart Rate (bpm): 160 Fundal Height: 25 cm Movement: Present    Fetal Surveillance Testing today: doppler   Results for orders placed or performed in visit on 12/04/18 (from the past 24 hour(s))  POC Urinalysis Dipstick OB   Collection Time: 12/04/18  3:27 PM  Result Value Ref Range   Color, UA     Clarity, UA     Glucose, UA Large (3+) (A) Negative   Bilirubin, UA     Ketones, UA small    Spec Grav, UA     Blood, UA neg    pH, UA     POC,PROTEIN,UA Trace Negative, Trace, Small (1+), Moderate (2+), Large (3+), 4+   Urobilinogen, UA     Nitrite, UA neg    Leukocytes, UA Negative Negative   Appearance     Odor    Results for orders placed or performed during the hospital encounter of 12/03/18 (from the past 24 hour(s))  Urinalysis, Routine w reflex microscopic   Collection Time: 12/03/18 11:50 PM  Result Value Ref Range   Color, Urine YELLOW YELLOW   APPearance CLOUDY (A) CLEAR   Specific Gravity, Urine 1.020 1.005 - 1.030   pH 6.0 5.0 - 8.0   Glucose, UA 50 (A) NEGATIVE mg/dL   Hgb urine dipstick NEGATIVE NEGATIVE   Bilirubin Urine NEGATIVE NEGATIVE   Ketones, ur NEGATIVE NEGATIVE mg/dL  Protein, ur NEGATIVE NEGATIVE mg/dL   Nitrite NEGATIVE NEGATIVE   Leukocytes,Ua NEGATIVE NEGATIVE  Wet prep, genital   Collection Time: 12/04/18 12:15 AM   Specimen: Thin Prep Cervical/Endocervical  Result Value Ref Range   Yeast Wet Prep HPF POC NONE SEEN NONE SEEN   Trich, Wet Prep NONE SEEN NONE SEEN   Clue Cells Wet Prep HPF POC NONE SEEN NONE SEEN   WBC, Wet Prep HPF POC NONE SEEN NONE SEEN   Sperm NONE SEEN   Fern Test   Collection Time: 12/04/18 12:28 AM  Result Value Ref Range   POCT Fern Test Positive = ruptured amniotic membanes   CBC   Collection Time: 12/04/18 12:55 AM  Result Value Ref Range   WBC 14.9 (H) 4.0 - 10.5 K/uL   RBC  3.85 (L) 3.87 - 5.11 MIL/uL   Hemoglobin 11.6 (L) 12.0 - 15.0 g/dL   HCT 34.0 (L) 36.0 - 46.0 %   MCV 88.3 80.0 - 100.0 fL   MCH 30.1 26.0 - 34.0 pg   MCHC 34.1 30.0 - 36.0 g/dL   RDW 13.8 11.5 - 15.5 %   Platelets 200 150 - 400 K/uL   nRBC 0.0 0.0 - 0.2 %  SARS Coronavirus 2 (CEPHEID - Performed in [place] hospital lab), Hosp Order   Collection Time: 12/04/18 12:56 AM   Specimen: Nasopharyngeal Swab  Result Value Ref Range   SARS Coronavirus 2 NEGATIVE NEGATIVE  Amnisure rupture of membrane (rom)not at [facility]   Collection Time: 12/04/18  1:50 AM  Result Value Ref Range   Amnisure ROM NEGATIVE     Assessment & Plan:  1) High-risk pregnancy G3P2002 at [redacted]w[redacted]d with an Estimated Date of Delivery: 03/28/19   2) CHTN no meds, stable  3) Leaking fluid, no pooling, fern & nitrazine neg today. Last night in MAU fern was only thing positive. Had u/s w/ deepest pocket 8+cm and neg amnisure. Does not appear to be ROM. No evidence of leaking anything at all on exam today. Pt to continue to monitor, if worsens to get checked out again.   Meds: No orders of the defined types were placed in this encounter.   Labs/procedures today: spec exam, fern, nitrazine  Treatment Plan: as previously noted  Reviewed: Preterm labor symptoms and general obstetric precautions including but not limited to vaginal bleeding, contractions, leaking of fluid and fetal movement were reviewed in detail with the patient.  All questions were answered.  Follow-up: Return for As scheduled.  Orders Placed This Encounter  Procedures  . POC Urinalysis Dipstick OB   Roma Schanz CNM, [facility] 12/04/2018 4:49 PM

## 2018-12-04 NOTE — Patient Instructions (Signed)
Lucia Gaskins, I greatly value your feedback.  If you receive a survey following your visit with [place] today, we appreciate you taking the time to fill it out.  Thanks, Knute Neu, CNM, [facility]  [place]!!! It is now [place] at [facility] ([place], [place] [address]) Entrance located off of [address] parking    Call the office [phone]) or go to [facility] if:  You begin to have strong, frequent contractions  Your water breaks.  Sometimes it is a big gush of fluid, sometimes it is just a trickle that keeps getting your panties wet or running down your legs  You have vaginal bleeding.  It is normal to have a small amount of spotting if your cervix was checked.   You don't feel your baby moving like normal.  If you don't, get you something to eat and drink and lay down and focus on feeling your baby move.  You should feel at least 10 movements in 2 hours.  If you don't, you should call the office or go to [place] and Birth Information  The normal length of a pregnancy is 39-41 weeks. Preterm labor is when labor starts before 37 completed weeks of pregnancy. What are the risk factors for preterm labor? Preterm labor is more likely to occur in women who:  Have certain infections during pregnancy such as a bladder infection, sexually transmitted infection, or infection inside the uterus (chorioamnionitis).  Have a shorter-than-normal cervix.  Have gone into preterm labor before.  Have had surgery on their cervix.  Are younger than age 38 or older than age 72.  Are African American.  Are pregnant with twins or multiple babies (multiple gestation).  Take street drugs or smoke while pregnant.  Do not gain enough weight while pregnant.  Became pregnant shortly after having been pregnant. What are the symptoms of preterm labor? Symptoms of preterm labor include:  Cramps similar  to those that can happen during a menstrual period. The cramps may happen with diarrhea.  Pain in the abdomen or lower back.  Regular uterine contractions that may feel like tightening of the abdomen.  A feeling of increased pressure in the pelvis.  Increased watery or bloody mucus discharge from the vagina.  Water breaking (ruptured amniotic sac). Why is it important to recognize signs of preterm labor? It is important to recognize signs of preterm labor because babies who are born prematurely may not be fully developed. This can put them at an increased risk for:  Long-term (chronic) heart and lung problems.  Difficulty immediately after birth with regulating body systems, including blood sugar, body temperature, heart rate, and breathing rate.  Bleeding in the brain.  Cerebral palsy.  Learning difficulties.  Death. These risks are highest for babies who are born before 69 weeks of pregnancy. How is preterm labor treated? Treatment depends on the length of your pregnancy, your condition, and the health of your baby. It may involve:  Having a stitch (suture) placed in your cervix to prevent your cervix from opening too early (cerclage).  Taking or being given medicines, such as: ? Hormone medicines. These may be given early in pregnancy to help support the pregnancy. ? Medicine to stop contractions. ? Medicines to help mature the baby's lungs. These may be prescribed if the risk of delivery is high. ? Medicines to prevent your baby from developing cerebral palsy. If  the labor happens before 34 weeks of pregnancy, you may need to stay in the hospital. What should I do if I think I am in preterm labor? If you think that you are going into preterm labor, call your health care provider right away. How can I prevent preterm labor in future pregnancies? To increase your chance of having a full-term pregnancy:  Do not use any tobacco products, such as cigarettes, chewing tobacco,  and e-cigarettes. If you need help quitting, ask your health care provider.  Do not use street drugs or medicines that have not been prescribed to you during your pregnancy.  Talk with your health care provider before taking any herbal supplements, even if you have been taking them regularly.  Make sure you gain a healthy amount of weight during your pregnancy.  Watch for infection. If you think that you might have an infection, get it checked right away.  Make sure to tell your health care provider if you have gone into preterm labor before. This information is not intended to replace advice given to you by your health care provider. Make sure you discuss any questions you have with your health care provider. Document Released: 08/21/2003 Document Revised: 11/11/2015 Document Reviewed: 10/22/2015 Elsevier Interactive Patient Education  2019 [employer]

## 2018-12-04 NOTE — Discharge Instructions (Signed)
Activity Restriction During Pregnancy °Your health care provider may recommend specific activity restrictions during pregnancy for a variety of reasons. Activity restriction may require that you limit activities that require great effort, such as exercise, lifting, or sex. °The type of activity restriction will vary for each person, depending on your risk or the problems you are having. Activity restriction may be recommended for a period of time until your baby is delivered. °Why are activity restrictions recommended? °Activity restriction may be recommended if: °· Your placenta is partially or completely covering the opening of your cervix (placenta previa). °· There is bleeding between the wall of the uterus and the amniotic sac in the first trimester of pregnancy (subchorionic hemorrhage). °· You went into labor too early (preterm labor). °· You have a history of miscarriage. °· You have a condition that causes high blood pressure during pregnancy (preeclampsia or eclampsia). °· You are pregnant with more than one baby. °· Your baby is not growing well. °What are the risks? °The risks depend on your specific restriction. Strict bed rest has the most physical and emotional risks and is no longer routinely recommended. Risks of strict bed rest include: °· Loss of muscle conditioning from not moving. °· Blood clots. °· Social isolation. °· Depression. °· Loss of income. °Talk with your health care team about activity restriction to decide if it is best for you and your baby. Even if you are having problems during your pregnancy, you may be able to continue with normal levels of activity with careful monitoring by your health care team. °Follow these instructions at home: °If needed, based on your overall health and the health of your baby, your health care provider will decide which type of activity restriction is right for you. Activity restrictions may include: °· Not lifting anything heavier than 10 pounds (4.5  kg). °· Avoiding activities that take a lot of physical effort. °· No lifting or straining. °· Resting in a sitting position or lying down for periods of time during the day. °Pelvic rest may be recommended along with activity restrictions. If pelvic rest is recommended, then: °· Do not have sex, an orgasm, or use sexual stimulation. °· Do not use tampons. Do not douche. Do not put anything into your vagina. °· Do not lift anything that is heavier than 10 lb (4.5 kg). °· Avoid activities that require a lot of effort. °· Avoid any activity in which your pelvic muscles could become strained, such as squatting. °Questions to ask your health care provider °· Why is my activity being limited? °· How will activity restrictions affect my body? °· Why is rest helpful for me and my baby? °· What activities can I do? °· When can I return to normal activities? °When should I seek immediate medical care? °Seek immediate medical care if you have: °· Vaginal bleeding. °· Vaginal discharge. °· Cramping pain in your lower abdomen. °· Regular contractions. °· A low, dull backache. °Summary °· Your health care provider may recommend specific activity restrictions during pregnancy for a variety of reasons. °· Activity restriction may require that you limit activities such as exercise, lifting, sex, or any other activity that requires great effort. °· Discuss the risks and benefits of activity restriction with your health care team to decide if it is best for you and your baby. °· Contact your health care provider right away if you think you are having contractions, or if you notice vaginal bleeding, discharge, or cramping. °This information is not   intended to replace advice given to you by your health care provider. Make sure you discuss any questions you have with your health care provider. °Document Released: 09/25/2010 Document Revised: 09/20/2017 Document Reviewed: 09/20/2017 °Elsevier Interactive Patient Education © 2019 Elsevier  Inc. ° °

## 2018-12-06 LAB — CULTURE, BETA STREP (GROUP B ONLY)

## 2018-12-11 ENCOUNTER — Telehealth: Payer: Self-pay | Admitting: Women's Health

## 2018-12-11 ENCOUNTER — Other Ambulatory Visit: Payer: Self-pay

## 2018-12-11 ENCOUNTER — Telehealth: Payer: Self-pay | Admitting: Obstetrics & Gynecology

## 2018-12-11 ENCOUNTER — Encounter (HOSPITAL_COMMUNITY): Payer: Self-pay

## 2018-12-11 ENCOUNTER — Inpatient Hospital Stay (HOSPITAL_COMMUNITY)
Admission: AD | Admit: 2018-12-11 | Discharge: 2018-12-11 | Disposition: A | Discharge status: Home / Self Care | Payer: BC Managed Care – PPO | Attending: Obstetrics & Gynecology | Admitting: Obstetrics & Gynecology

## 2018-12-11 DIAGNOSIS — B9689 Other specified bacterial agents as the cause of diseases classified elsewhere: Secondary | ICD-10-CM | POA: Diagnosis not present

## 2018-12-11 DIAGNOSIS — F17210 Nicotine dependence, cigarettes, uncomplicated: Secondary | ICD-10-CM | POA: Diagnosis not present

## 2018-12-11 DIAGNOSIS — O99332 Smoking (tobacco) complicating pregnancy, second trimester: Secondary | ICD-10-CM | POA: Insufficient documentation

## 2018-12-11 DIAGNOSIS — R109 Unspecified abdominal pain: Secondary | ICD-10-CM | POA: Diagnosis not present

## 2018-12-11 DIAGNOSIS — N760 Acute vaginitis: Secondary | ICD-10-CM | POA: Diagnosis not present

## 2018-12-11 DIAGNOSIS — R05 Cough: Secondary | ICD-10-CM | POA: Diagnosis not present

## 2018-12-11 DIAGNOSIS — Z3A24 24 weeks gestation of pregnancy: Secondary | ICD-10-CM | POA: Insufficient documentation

## 2018-12-11 DIAGNOSIS — R509 Fever, unspecified: Secondary | ICD-10-CM | POA: Diagnosis not present

## 2018-12-11 DIAGNOSIS — O26892 Other specified pregnancy related conditions, second trimester: Secondary | ICD-10-CM | POA: Insufficient documentation

## 2018-12-11 DIAGNOSIS — O23592 Infection of other part of genital tract in pregnancy, second trimester: Secondary | ICD-10-CM | POA: Insufficient documentation

## 2018-12-11 DIAGNOSIS — B349 Viral infection, unspecified: Secondary | ICD-10-CM | POA: Diagnosis not present

## 2018-12-11 DIAGNOSIS — Z3490 Encounter for supervision of normal pregnancy, unspecified, unspecified trimester: Secondary | ICD-10-CM | POA: Diagnosis not present

## 2018-12-11 LAB — URINALYSIS, ROUTINE W REFLEX MICROSCOPIC
Bilirubin Urine: NEGATIVE
Glucose, UA: >=500 mg/dL — AB
Hgb urine dipstick: NEGATIVE
Ketones, ur: NEGATIVE mg/dL
Leukocytes,Ua: NEGATIVE
Nitrite: NEGATIVE
Protein, ur: 30 mg/dL — AB
Specific Gravity, Urine: 1.029 (ref 1.005–1.030)
pH: 6 (ref 5.0–8.0)

## 2018-12-11 LAB — WET PREP, GENITAL
Sperm: NONE SEEN
Trich, Wet Prep: NONE SEEN
Yeast Wet Prep HPF POC: NONE SEEN

## 2018-12-11 LAB — AMNISURE RUPTURE OF MEMBRANE (ROM) NOT AT ARMC: Amnisure ROM: NEGATIVE

## 2018-12-11 MED ORDER — METRONIDAZOLE 500 MG PO TABS: 500.0000 mg | ORAL_TABLET | Freq: Two times a day (BID) | ORAL | 0 refills | Status: DC

## 2018-12-11 NOTE — Discharge Instructions (Signed)

## 2018-12-11 NOTE — MAU Note (Signed)
Pt reports to MAU c/o feeling sick x2 months pt has been on medications and swabbed for COVID twice and it was negative both times. Pt reports she had a 103 fever at her PCP and has some lower abdominal tenderness. Pt has been tachycardic as well. Pt states she gets SOB easily. Pt was tested a week ago for LOF and it was negative but still is leaking fluid. Pt reports NVD. Pt reports some spotting but dose nott know if she is spotting or if its from shaving.

## 2018-12-11 NOTE — Telephone Encounter (Signed)
Patient called, stated that she has been sick for 2 months, patient stated that she is running a fever of 103, taken at her PCP doctor.  Almost 25 weeks.  She was about to leave a urine sample at the PCP office and her pants were soaked and it smelt sweetish. PCP told her she needed to call us because they don't know what is wrong with her.    [phone]

## 2018-12-11 NOTE — MAU Provider Note (Signed)
Patient [name] is a 23 y.o. G3P2002 At 4134w5d here with complaints of [facility]. Her fever was 103. Her PCP told her to come here for evaluation due to the fever and the possible ROM. She says that she has been "sick for two months". She was tested for COVID-19 last week and it was negative; she was tested 3 weeks prior to that and it was also negative. She has not had a chest x-ray. She has not had a change or worsening of her symptoms other than her fever today of reported 103.   She reports cough that is so hard that she is throwing up mucous. She also reports that she has had diarrhea once today and had diarrhea 2-3 times yesterday. She states that she goes to the bathroom "a lot anyways". Her diarrhea has gotten worse in the past two weeks.   She has taken antibiotics, steriods and allergy medicine, all mananaged through [facility]leSkillmans' office.   She is here today because she thinks she is leaking fluid.  History     She reports cough that is so hard that she is throwing up mucous. She reports that she coughs more In the morning, and that it gets better throughout the day. She says that she coughs up green and white mucous.    She also reports that she has had diarrhea once today and had diarrhea 2-3 times yesterday. She states that she goes to the bathroom "a lot anyways". Her diarrhea has gotten worse in the past two weeks.   She has taken antibiotics, steriods and allergy medicine, all mananaged through [facility]Skillmans' office.   She is here today because she thinks she is leaking fluid. CSN: [id number]  Arrival date and time: 12/11/18 [id number]   First Provider Initiated Contact with Patient 12/11/18 2037      Chief Complaint  Patient presents with  . Rupture of Membranes  . Fever  . Abdominal Pain  . Shortness of Breath   Vaginal Discharge The patient's primary symptoms include vaginal discharge. Associated symptoms include diarrhea. Pertinent  negatives include no dysuria, fever or nausea. The vaginal discharge was watery and clear (Sometimes it has an odor, it doesn't always have a smell. ). There has been no bleeding. Exacerbated by: worse with coughing.  She is not sexually active.    OB History    Gravida  3   Para  2   Term  2   Preterm      AB      Living  2     SAB      TAB      Ectopic      Multiple  0   Live Births  2           Past Medical History:  Diagnosis Date  . Anxiety   . Asthma    childhood  . Depression    post partum  . Dyspnea   . Headache   . Hypertension   . Irregular menstrual bleeding 01/31/2014  . PTSD (post-traumatic stress disorder)    pTSD  . Recurrent upper respiratory infection (URI)    Bronchitis  . Vaginal Pap smear, abnormal    HPV    Past Surgical History:  Procedure Laterality Date  . CHOLECYSTECTOMY    . CYST REMOVAL NECK    . TYMPANOSTOMY TUBE PLACEMENT    . TYMPANOSTOMY TUBE PLACEMENT      Family History  Problem  Relation Age of Onset  . Bipolar disorder Maternal Grandmother   . Heart disease Maternal Grandfather   . Thyroid disease Paternal Grandmother   . Arthritis Mother   . Fibromyalgia Mother   . Diabetes Mother   . Cancer Mother        vaginal  . Birth defects Father     Social History   Tobacco Use  . Smoking status: Current Every Day Smoker    Packs/day: 1.00    Years: 9.00    Pack years: 9.00    Types: Cigarettes  . Smokeless tobacco: Never Used  Substance Use Topics  . Alcohol use: No    Comment: socially  . Drug use: No    Types: Marijuana    Comment: denies use 06/20/14    Allergies: No Known Allergies  No medications prior to admission.    Review of Systems  Constitutional: Negative for fever.  HENT: Negative.   Respiratory: Positive for shortness of breath.   Gastrointestinal: Positive for diarrhea. Negative for nausea.  Genitourinary: Positive for vaginal discharge. Negative for dysuria.  Musculoskeletal:  Negative.   Neurological: Negative.   Psychiatric/Behavioral: Negative.    Physical Exam   Blood pressure 126/72, pulse (!) 116, temperature 98.2 F (36.8 C), temperature source Oral, resp. rate 20, last menstrual period 09/05/2017, SpO2 98 %, not currently breastfeeding.  Physical Exam  Constitutional: She is oriented to person, place, and time. She appears well-developed. No distress.  HENT:  Head: Normocephalic.  Cardiovascular: Normal rate.  Respiratory: Effort normal and breath sounds normal. No respiratory distress. She has no wheezes. She has no rales. She exhibits no tenderness.  GI: Soft. Bowel sounds are normal.  Genitourinary:    Vagina normal.     Genitourinary Comments: NEFG: No discharge in the vagina, cervix is pink with no lesions; no pooling, no odor. Cervical exam deferred.    Musculoskeletal: Normal range of motion.  Neurological: She is alert and oriented to person, place, and time. She has normal reflexes.  Skin: Skin is warm and dry. She is not diaphoretic.    MAU Course  Procedures  MDM -wet prep positive for clue, GC pending.  -UA shows greater than 500 of glucose but no signs of infection.   -NST: 155 bpm, mod mar, present acel, neg decels, no contractions.   -Afebrile while in MAU, VSS. Patient appears completely comfortable in bed, no difficulty talking or moving. I spent more than 20 minutes at the bedside and did not observe any coughing. She reports frustration that we "cant' figure out what is wrong with her" and plans to see [name] tomorrow  At [facility].  Assessment and Plan   1. Bacterial vaginosis    2. Reviewed warning signs of labor; signs of PROM. Provided reassurance that her water is not broken and that her baby appears very healthy based on FHR.   3. Explained that many folks develop a chronic bronchitis after illness; unlikely that she has COVID-19 due to lack of respiratory distress, current fever, chills, body aches or other  COVID-19. If she develops any of those symptoms she should go to [facility] or [employer].   4. Keep appt with [facility] on Thursday, July 2nd and keep appt with [facility] tomorrow.   5. All questions answered, patient stable for discharge.   Mervyn Skeeters  12/11/2018, 10:14 PM

## 2018-12-11 NOTE — Telephone Encounter (Signed)
Patient called stating that she is having some fluid leakage and slight fever. Please contact pt

## 2018-12-12 LAB — GC/CHLAMYDIA PROBE AMP (~~LOC~~) NOT AT ARMC
Chlamydia: NEGATIVE
Neisseria Gonorrhea: NEGATIVE

## 2018-12-14 ENCOUNTER — Encounter: Payer: BC Managed Care – PPO | Admitting: Obstetrics & Gynecology

## 2018-12-19 ENCOUNTER — Ambulatory Visit (INDEPENDENT_AMBULATORY_CARE_PROVIDER_SITE_OTHER): Payer: BC Managed Care – PPO | Admitting: Women's Health

## 2018-12-19 ENCOUNTER — Inpatient Hospital Stay (HOSPITAL_COMMUNITY): Payer: BC Managed Care – PPO

## 2018-12-19 ENCOUNTER — Telehealth: Payer: Self-pay | Admitting: *Deleted

## 2018-12-19 ENCOUNTER — Other Ambulatory Visit: Payer: Self-pay

## 2018-12-19 ENCOUNTER — Encounter (HOSPITAL_COMMUNITY): Payer: Self-pay | Admitting: *Deleted

## 2018-12-19 ENCOUNTER — Encounter: Payer: Self-pay | Admitting: Women's Health

## 2018-12-19 ENCOUNTER — Inpatient Hospital Stay (HOSPITAL_COMMUNITY)
Admission: AD | Admit: 2018-12-19 | Discharge: 2018-12-19 | Disposition: A | Discharge status: Home / Self Care | Payer: BC Managed Care – PPO | Attending: Obstetrics and Gynecology | Admitting: Obstetrics and Gynecology

## 2018-12-19 VITALS — BP 133/80 | HR 124 | Temp 98.6°F | Wt 256.0 lb

## 2018-12-19 DIAGNOSIS — O99891 Other specified diseases and conditions complicating pregnancy: Secondary | ICD-10-CM

## 2018-12-19 DIAGNOSIS — O10012 Pre-existing essential hypertension complicating pregnancy, second trimester: Secondary | ICD-10-CM | POA: Diagnosis not present

## 2018-12-19 DIAGNOSIS — Z3A25 25 weeks gestation of pregnancy: Secondary | ICD-10-CM | POA: Insufficient documentation

## 2018-12-19 DIAGNOSIS — O10912 Unspecified pre-existing hypertension complicating pregnancy, second trimester: Secondary | ICD-10-CM | POA: Diagnosis not present

## 2018-12-19 DIAGNOSIS — F17210 Nicotine dependence, cigarettes, uncomplicated: Secondary | ICD-10-CM | POA: Insufficient documentation

## 2018-12-19 DIAGNOSIS — R1110 Vomiting, unspecified: Secondary | ICD-10-CM

## 2018-12-19 DIAGNOSIS — H538 Other visual disturbances: Secondary | ICD-10-CM

## 2018-12-19 DIAGNOSIS — R519 Headache, unspecified: Secondary | ICD-10-CM

## 2018-12-19 DIAGNOSIS — Z1389 Encounter for screening for other disorder: Secondary | ICD-10-CM

## 2018-12-19 DIAGNOSIS — R51 Headache: Secondary | ICD-10-CM

## 2018-12-19 DIAGNOSIS — R81 Glycosuria: Secondary | ICD-10-CM | POA: Diagnosis not present

## 2018-12-19 DIAGNOSIS — O99332 Smoking (tobacco) complicating pregnancy, second trimester: Secondary | ICD-10-CM | POA: Insufficient documentation

## 2018-12-19 DIAGNOSIS — O26892 Other specified pregnancy related conditions, second trimester: Secondary | ICD-10-CM

## 2018-12-19 DIAGNOSIS — O9989 Other specified diseases and conditions complicating pregnancy, childbirth and the puerperium: Secondary | ICD-10-CM | POA: Diagnosis not present

## 2018-12-19 DIAGNOSIS — Z331 Pregnant state, incidental: Secondary | ICD-10-CM

## 2018-12-19 DIAGNOSIS — O219 Vomiting of pregnancy, unspecified: Secondary | ICD-10-CM

## 2018-12-19 DIAGNOSIS — O0992 Supervision of high risk pregnancy, unspecified, second trimester: Secondary | ICD-10-CM

## 2018-12-19 DIAGNOSIS — O0990 Supervision of high risk pregnancy, unspecified, unspecified trimester: Secondary | ICD-10-CM

## 2018-12-19 LAB — URINALYSIS, ROUTINE W REFLEX MICROSCOPIC
Bilirubin Urine: NEGATIVE
Glucose, UA: >=500 mg/dL — AB
Hgb urine dipstick: NEGATIVE
Ketones, ur: NEGATIVE mg/dL
Leukocytes,Ua: NEGATIVE
Nitrite: NEGATIVE
Protein, ur: NEGATIVE mg/dL
Specific Gravity, Urine: 1.013 (ref 1.005–1.030)
pH: 7 (ref 5.0–8.0)

## 2018-12-19 LAB — COMPREHENSIVE METABOLIC PANEL
ALT: 12 U/L (ref 0–44)
AST: 14 U/L — ABNORMAL LOW (ref 15–41)
Albumin: 2.9 g/dL — ABNORMAL LOW (ref 3.5–5.0)
Alkaline Phosphatase: 85 U/L (ref 38–126)
Anion gap: 10 (ref 5–15)
BUN: <5 mg/dL — ABNORMAL LOW (ref 6–20)
CO2: 19 mmol/L — ABNORMAL LOW (ref 22–32)
Calcium: 9.2 mg/dL (ref 8.9–10.3)
Chloride: 107 mmol/L (ref 98–111)
Creatinine, Ser: 0.48 mg/dL (ref 0.44–1.00)
GFR calc Af Amer: >60 mL/min (ref >60)
GFR calc non Af Amer: >60 mL/min (ref >60)
Glucose, Bld: 119 mg/dL — ABNORMAL HIGH (ref 70–99)
Potassium: 3.3 mmol/L — ABNORMAL LOW (ref 3.5–5.1)
Sodium: 136 mmol/L (ref 135–145)
Total Bilirubin: 0.4 mg/dL (ref 0.3–1.2)
Total Protein: 6.7 g/dL (ref 6.5–8.1)

## 2018-12-19 LAB — PROTEIN / CREATININE RATIO, URINE
Creatinine, Urine: 113.42 mg/dL
Protein Creatinine Ratio: 0.1 mg/mg{Cre} (ref 0.00–0.15)
Total Protein, Urine: 11 mg/dL

## 2018-12-19 LAB — POCT URINALYSIS DIPSTICK OB
Blood, UA: NEGATIVE
Glucose, UA: NEGATIVE
Ketones, UA: NEGATIVE
Nitrite, UA: NEGATIVE
POC,PROTEIN,UA: NEGATIVE

## 2018-12-19 LAB — CBC
HCT: 33.7 % — ABNORMAL LOW (ref 36.0–46.0)
Hemoglobin: 11.8 g/dL — ABNORMAL LOW (ref 12.0–15.0)
MCH: 30.6 pg (ref 26.0–34.0)
MCHC: 35 g/dL (ref 30.0–36.0)
MCV: 87.5 fL (ref 80.0–100.0)
Platelets: 212 10*3/uL (ref 150–400)
RBC: 3.85 MIL/uL — ABNORMAL LOW (ref 3.87–5.11)
RDW: 14 % (ref 11.5–15.5)
WBC: 12.9 10*3/uL — ABNORMAL HIGH (ref 4.0–10.5)
nRBC: 0 % (ref 0.0–0.2)

## 2018-12-19 MED ORDER — LACTATED RINGERS IV BOLUS
1000.0000 mL | Freq: Once | INTRAVENOUS | Status: AC
  Administered 2018-12-19: 1000 mL via INTRAVENOUS

## 2018-12-19 MED ORDER — PROCHLORPERAZINE EDISYLATE 10 MG/2ML IJ SOLN
10.0000 mg | Freq: Once | INTRAMUSCULAR | Status: AC
  Administered 2018-12-19: 10 mg via INTRAVENOUS
  Filled 2018-12-19: qty 2

## 2018-12-19 MED ORDER — DEXAMETHASONE SODIUM PHOSPHATE 10 MG/ML IJ SOLN
10.0000 mg | Freq: Once | INTRAMUSCULAR | Status: AC
  Administered 2018-12-19: 10 mg via INTRAVENOUS
  Filled 2018-12-19: qty 1

## 2018-12-19 MED ORDER — DIPHENHYDRAMINE HCL 50 MG/ML IJ SOLN
25.0000 mg | Freq: Once | INTRAMUSCULAR | Status: AC
  Administered 2018-12-19: 25 mg via INTRAVENOUS
  Filled 2018-12-19: qty 1

## 2018-12-19 MED ORDER — TRAMADOL HCL 50 MG PO TABS: 50.0000 mg | ORAL_TABLET | Freq: Four times a day (QID) | ORAL | 0 refills | Status: DC | PRN

## 2018-12-19 MED ORDER — ONDANSETRON 8 MG PO TBDP: 8.0000 mg | ORAL_TABLET | Freq: Three times a day (TID) | ORAL | 2 refills | Status: DC | PRN

## 2018-12-19 NOTE — Progress Notes (Signed)
Work-in HIGH-RISK PREGNANCY VISIT Patient name: [name] MRN [id number]  Date of birth: May 01, 1996 Chief Complaint:   working ob (headache/ vomiting x 3)  History of Present Illness:   [name] is a 23 y.o. 203P2002 female at 3556w6d with an Estimated Date of Delivery: 03/28/19 being seen today for ongoing management of a high-risk pregnancy [facility].  Today she reports persistent frontal severe headache x 3 days, started Sun when she got home from grocery store- thought it was because she got too hot, then just persisted. Has been taking 1gm APAP q 3hrs w/o relief. Head starts pounding in the back, then vomits. Has vomited x 3. Blurred vision which is new for her. Always has floaters, even pre-pregnancy- no change in these. No ruq/epigastric pain. Reports she's been sick w/ cough/congestion x 2mths, has been treated by PCP w/ antibiotics and steroids, had negative Covid testing. Still has lingering cough. .Currently rates headache 7/10, last took APAP around 0630. Has normal headaches outside of pregnancy, but have always been relieved with APAP.  Contractions: Not present.  .  Movement: Present. denies leaking of fluid.  Review of Systems:   Pertinent items are noted in HPI Denies abnormal vaginal discharge w/ itching/odor/irritation, headaches, visual changes, shortness of breath, chest pain, abdominal pain, severe nausea/vomiting, or problems with urination or bowel movements unless otherwise stated above. Pertinent History Reviewed:  Reviewed past medical,surgical, social, obstetrical and family history.  Reviewed problem list, medications and allergies. Physical Assessment:   Vitals:   12/19/18 1031 12/19/18 1113  BP: 133/80   Pulse: (!) 143 (!) 124  Temp: 98.6 F (37 C)   Weight: 256 lb (116.1 kg)   Body mass index is 40.1 kg/m. Pulse ox: 100% RA Repeat HR: 124       Physical Examination:   General appearance: appears like she doesn't feel well  Mental status: alert, oriented to person, place, and time  Skin: warm & dry   Extremities: Edema: None    Cardiovascular: tachycardic  Respiratory: normal respiratory effort, no distress, LCTAB  Abdomen: gravid, soft, non-tender  Pelvic: Cervical exam deferred         Fetal Status: Fetal Heart Rate (bpm): 160 Fundal Height: 28 cm Movement: Present    Fetal Surveillance Testing today: doppler   Results for orders placed or performed in visit on 12/19/18 (from the past 24 hour(s))  POC Urinalysis Dipstick OB   Collection Time: 12/19/18 10:38 AM  Result Value Ref Range   Color, UA     Clarity, UA     Glucose, UA Negative Negative   Bilirubin, UA     Ketones, UA neg    Spec Grav, UA     Blood, UA neg    pH, UA     POC,PROTEIN,UA Negative Negative, Trace, Small (1+), Moderate (2+), Large (3+), 4+   Urobilinogen, UA     Nitrite, UA neg    Leukocytes, UA     Appearance     Odor      Assessment & Plan:  1) High-risk pregnancy G3P2002 at 4656w6d with an Estimated Date of Delivery: 03/28/19   2) CHTN, no meds, bp normal today  3) Severe persistent headache x 3d w/ vomiting & blurred vision, atypical pre-e vs. migraine vs. brain pathology.  Discussed w/ [facility], recommends go to MAU for workup to include head CT. Notified Gerrit HeckJessica Emly, CNM.   4) Lingering cough from recent illness  Meds: No orders of  the defined types were placed in this encounter.   Labs/procedures today: none  Treatment Plan:  To MAU for work-up  Follow-up: No follow-ups on file.  Orders Placed This Encounter  Procedures  . POC Urinalysis Dipstick OB   Roma Schanz CNM, [facility] 12/19/2018 11:14 AM

## 2018-12-19 NOTE — Discharge Instructions (Signed)
Do not take any medication with Tylenol (acetaminophen) and it for the next month.  In the future do not exceed 4000 mg Tylenol in 24 hours.  Analgesic Rebound Headache An analgesic rebound headache, sometimes called a medication overuse headache, is a headache that comes after pain medicine (analgesic) taken to treat the original (primary) headache has worn off. Any type of primary headache can return as a rebound headache if a person regularly takes analgesics more than three times a week to treat it. The types of primary headaches that are commonly associated with rebound headaches include:  Migraines.  Headaches that arise from tense muscles in the head and neck area (tension headaches).  Headaches that develop and happen again (recur) on one side of the head and around the eye (cluster headaches). If rebound headaches continue, they become chronic daily headaches. What are the causes? This condition may be caused by frequent use of:  Over-the-counter medicines such as aspirin, ibuprofen, and acetaminophen.  Sinus relief medicines and other medicines that contain caffeine.  Narcotic pain medicines such as codeine and oxycodone. What are the signs or symptoms? The symptoms of a rebound headache are the same as the symptoms of the original headache. Some of the symptoms of specific types of headaches include: Migraine headache  Pulsing or throbbing pain on one or both sides of the head.  Severe pain that interferes with daily activities.  Pain that is worsened by physical activity.  Nausea, vomiting, or both.  Pain with exposure to bright light, loud noises, or strong smells.  General sensitivity to bright light, loud noises, or strong smells.  Visual changes.  Numbness of one or both arms. Tension headache  Pressure around the head.  Dull, aching head pain.  Pain felt over the front and sides of the head.  Tenderness in the muscles of the head, neck, and  shoulders. Cluster headache  Severe pain that begins in or around one eye or temple.  Redness and tearing in the eye on the same side as the pain.  Droopy or swollen eyelid.  One-sided head pain.  Nausea.  Runny nose.  Sweaty, pale facial skin.  Restlessness. How is this diagnosed? This condition is diagnosed by:  Reviewing your medical history. This includes the nature of your primary headaches.  Reviewing the types of pain medicines that you have been using to treat your headaches and how often you take them. How is this treated? This condition may be treated or managed by:  Discontinuing frequent use of the analgesic medicine. Doing this may worsen your headaches at first, but the pain should eventually become more manageable, less frequent, and less severe.  Seeing a headache specialist. He or she may be able to help you manage your headaches and help make sure there is not another cause of the headaches.  Using methods of stress relief, such as acupuncture, counseling, biofeedback, and massage. Talk with your health care provider about which methods might be good for you. Follow these instructions at home:  Take over-the-counter and prescription medicines only as told by your health care provider.  Stop the repeated use of pain medicine as told by your health care provider. Stopping can be difficult. Carefully follow instructions from your health care provider.  Avoid triggers that are known to cause your primary headaches.  Keep all follow-up visits as told by your health care provider. This is important. Contact a health care provider if:  You continue to experience headaches after following treatments that  your health care provider recommended. Get help right away if:  You develop new headache pain.  You develop headache pain that is different than what you have experienced in the past.  You develop numbness or tingling in your arms or legs.  You develop  changes in your speech or vision. This information is not intended to replace advice given to you by your health care provider. Make sure you discuss any questions you have with your health care provider. Document Released: 08/21/2003 Document Revised: 05/13/2017 Document Reviewed: 11/03/2015 Elsevier Patient Education  2020 [employer]

## 2018-12-19 NOTE — Telephone Encounter (Signed)
Spoke with pt. Pt has had a headache for 3 days. Has tried Tylenol every 3 hours, heat and cold pack. Nothing is helping. Pt having vomiting with headache. Vision is blurry. Please advise. Thanks!! [place]

## 2018-12-19 NOTE — Telephone Encounter (Signed)
Patient called stating she has had a headache for 3 days straight, and nothing is helping. Patient states her vision is blurry and when her head starts throbbing it makes her puke. Please advise [phone]

## 2018-12-19 NOTE — MAU Provider Note (Signed)
Chief Complaint:  BP Evaluation   First Provider Initiated Contact with Patient 12/19/18 1334     HPI: [name] is a 23 y.o. G3P2002 at 1042w6d who was sent to maternity admissions for evaluation of severe, persistent headache.  [facility] patient of [facility].  Has chronic hypertension.  Called office because of headache.  She was assessed in the office and Dr. Despina HiddenEure recommended that she go to maternity admissions for further assessment and head CT.  Headache Location: Frontal Quality: Throbbing Severity: 8-10/10 in pain scale Duration: 3 days Context: [redacted] weeks gestation, chronic hypertension--no meds.  History of headaches and previous pregnancy, but none outside of pregnancy. Timing: Constant with intermittent exacerbations Modifying factors: No improvement with thousand milligrams of Tylenol every 3 hours, p.o. hydration, low stimulation environment. Associated signs and symptoms: Positive for vision changes, nausea, vomiting.  Negative for fever, chills, difficulties with speech or gait, weakness, epigastric pain.  Denies contractions, leakage of fluid or vaginal bleeding. Good fetal movement.   Pregnancy Course:  Patient Active Problem List   Diagnosis Date Noted  . Glycosuria during pregnancy, not delivered 12/19/2018  . Supervision of high risk pregnancy, antepartum 08/17/2018  . PTSD (post-traumatic stress disorder) 03/30/2018  . MDD (major depressive disorder), recurrent episode, moderate (HCC) 03/30/2018  . Multiple acquired skin tags 02/18/2017  . Chronic hypertension in pregnancy 01/24/2017  . Abnormal EKG 01/02/2017  . Tachycardia 01/02/2017  . Abnormal Pap smear of cervix 07/26/2016  . Rh negative state in antepartum period 07/20/2016  . Smoker 05/07/2013     Past Medical History:  Diagnosis Date  . Anxiety   . Asthma    childhood  . Depression    post partum  . Dyspnea   . Headache   . Hypertension   . Irregular menstrual bleeding 01/31/2014  . PTSD  (post-traumatic stress disorder)    pTSD  . Recurrent upper respiratory infection (URI)    Bronchitis  . Vaginal Pap smear, abnormal    HPV   OB History  Gravida Para Term Preterm AB Living  3 2 2     2   SAB TAB Ectopic Multiple Live Births        0 2    # Outcome Date GA Lbr Len/2nd Weight Sex Delivery Anes PTL Lv  3 Current           2 Term 01/25/17 4493w1d / 01:26 3410 g F Vag-Spont EPI  LIV  1 Term 06/13/13 5593w6d 26:18 / 06:10 3635 g M Vag-Spont EPI N LIV     Birth Comments: caput   Past Surgical History:  Procedure Laterality Date  . CHOLECYSTECTOMY    . CYST REMOVAL NECK    . TYMPANOSTOMY TUBE PLACEMENT    . TYMPANOSTOMY TUBE PLACEMENT     Family History  Problem Relation Age of Onset  . Bipolar disorder Maternal Grandmother   . Heart disease Maternal Grandfather   . Thyroid disease Paternal Grandmother   . Arthritis Mother   . Fibromyalgia Mother   . Diabetes Mother   . Cancer Mother        vaginal  . Birth defects Father    Social History   Tobacco Use  . Smoking status: Current Every Day Smoker    Packs/day: 1.00    Years: 9.00    Pack years: 9.00    Types: Cigarettes  . Smokeless tobacco: Never Used  Substance Use Topics  . Alcohol use: Not Currently    Comment:  socially  . Drug use: No    Types: Marijuana    Comment: denies use 06/20/14   No Known Allergies Medications Prior to Admission  Medication Sig Dispense Refill Last Dose  . cyclobenzaprine (FLEXERIL) 10 MG tablet Take 1 tablet (10 mg total) by mouth every 8 (eight) hours as needed for muscle spasms. 30 tablet 1 Past Week at Unknown time  . DULoxetine (CYMBALTA) 60 MG capsule Take 1 capsule (60 mg total) by mouth daily. 90 capsule 0 12/19/2018 at 0700  . prenatal vitamin w/FE, FA (PRENATAL 1 + 1) 27-1 MG TABS tablet Take 1 tablet by mouth daily at 12 noon. 30 each 12 12/18/2018 at 0800  . metroNIDAZOLE (FLAGYL) 500 MG tablet Take 1 tablet (500 mg total) by mouth 2 (two) times daily. 14 tablet 0      I have reviewed patient's Past Medical Hx, Surgical Hx, Family Hx, Social Hx, medications and allergies.   ROS:  Review of Systems  Constitutional: Negative for chills and fever.  HENT: Negative for congestion, rhinorrhea and sinus pain.   Eyes: Positive for photophobia and visual disturbance. Negative for pain.  Gastrointestinal: Positive for nausea and vomiting. Negative for abdominal pain and diarrhea.  Genitourinary: Negative for vaginal bleeding.  Musculoskeletal: Negative for neck pain and neck stiffness.  Neurological: Positive for headaches. Negative for facial asymmetry, speech difficulty, weakness and numbness.  Psychiatric/Behavioral: Negative for confusion.    Physical Exam   Patient Vitals for the past 24 hrs:  BP Temp Temp src Pulse Resp SpO2 Height Weight  12/19/18 1633 119/64 97.9 F (36.6 C) Oral 87 - 99 % - -  12/19/18 1516 (!) 115/49 - - 92 - - - -  12/19/18 1501 (!) 93/48 - - 78 - - - -  12/19/18 1450 (!) 102/49 - - 87 16 - - -  12/19/18 1415 (!) 108/57 - - 94 - 99 % - -  12/19/18 1400 (!) 97/55 - - (!) 101 - 99 % - -  12/19/18 1345 (!) 113/59 - - 98 - 99 % - -  12/19/18 1337 - - - - - 99 % - -  12/19/18 1330 130/82 - - 100 - 99 % - -  12/19/18 1315 115/70 - - 99 - 98 % - -  12/19/18 1300 122/66 - - 98 - 98 % - -  12/19/18 1245 136/77 - - (!) 121 - 98 % - -  12/19/18 1227 133/78 98.3 F (36.8 C) Oral (!) 127 20 100 % 5\' 7"  (1.702 m) 107.5 kg   Constitutional: Well-developed, well-nourished, moderately uncomfortable-appearing female.  Head: Mucous membranes moist Cardiovascular: Mild tachycardia that improved with rest Respiratory: normal effort GI: Abd soft, non-tender, gravid appropriate for gestational age.  MS: Extremities nontender, Tr edema Neurologic: Alert and oriented x 4.  Normal speech and gait. GU: Deferred  FHT:  Baseline 150 , moderate variability, accelerations present, no decelerations Contractions: None   Labs: Results for  orders placed or performed during the hospital encounter of 12/19/18 (from the past 24 hour(s))  CBC     Status: Abnormal   Collection Time: 12/19/18 12:38 PM  Result Value Ref Range   WBC 12.9 (H) 4.0 - 10.5 K/uL   RBC 3.85 (L) 3.87 - 5.11 MIL/uL   Hemoglobin 11.8 (L) 12.0 - 15.0 g/dL   HCT 16.133.7 (L) 09.636.0 - 04.546.0 %   MCV 87.5 80.0 - 100.0 fL   MCH 30.6 26.0 - 34.0 pg   MCHC 35.0  30.0 - 36.0 g/dL   RDW 40.914.0 81.111.5 - 91.415.5 %   Platelets 212 150 - 400 K/uL   nRBC 0.0 0.0 - 0.2 %  Comprehensive metabolic panel     Status: Abnormal   Collection Time: 12/19/18 12:38 PM  Result Value Ref Range   Sodium 136 135 - 145 mmol/L   Potassium 3.3 (L) 3.5 - 5.1 mmol/L   Chloride 107 98 - 111 mmol/L   CO2 19 (L) 22 - 32 mmol/L   Glucose, Bld 119 (H) 70 - 99 mg/dL   BUN <5 (L) 6 - 20 mg/dL   Creatinine, Ser 7.820.48 0.44 - 1.00 mg/dL   Calcium 9.2 8.9 - 95.610.3 mg/dL   Total Protein 6.7 6.5 - 8.1 g/dL   Albumin 2.9 (L) 3.5 - 5.0 g/dL   AST 14 (L) 15 - 41 U/L   ALT 12 0 - 44 U/L   Alkaline Phosphatase 85 38 - 126 U/L   Total Bilirubin 0.4 0.3 - 1.2 mg/dL   GFR calc non Af Amer >60 >60 mL/min   GFR calc Af Amer >60 >60 mL/min   Anion gap 10 5 - 15  Protein / creatinine ratio, urine     Status: None   Collection Time: 12/19/18 12:46 PM  Result Value Ref Range   Creatinine, Urine 113.42 mg/dL   Total Protein, Urine 11 mg/dL   Protein Creatinine Ratio 0.10 0.00 - 0.15 mg/mg[Cre]  Urinalysis, Routine w reflex microscopic     Status: Abnormal   Collection Time: 12/19/18 12:46 PM  Result Value Ref Range   Color, Urine YELLOW YELLOW   APPearance CLEAR CLEAR   Specific Gravity, Urine 1.013 1.005 - 1.030   pH 7.0 5.0 - 8.0   Glucose, UA >=500 (A) NEGATIVE mg/dL   Hgb urine dipstick NEGATIVE NEGATIVE   Bilirubin Urine NEGATIVE NEGATIVE   Ketones, ur NEGATIVE NEGATIVE mg/dL   Protein, ur NEGATIVE NEGATIVE mg/dL   Nitrite NEGATIVE NEGATIVE   Leukocytes,Ua NEGATIVE NEGATIVE   RBC / HPF 0-5 0 - 5 RBC/hpf    WBC, UA 0-5 0 - 5 WBC/hpf   Bacteria, UA RARE (A) NONE SEEN   Squamous Epithelial / LPF 0-5 0 - 5   Mucus PRESENT     Imaging:  Ct Head Wo Contrast  Result Date: 12/19/2018 CLINICAL DATA:  New headache. EXAM: CT HEAD WITHOUT CONTRAST TECHNIQUE: Contiguous axial images were obtained from the base of the skull through the vertex without intravenous contrast. COMPARISON:  03/14/2016 FINDINGS: Brain: No evidence of acute infarction, hemorrhage, hydrocephalus, extra-axial collection or mass lesion/mass effect. Vascular: No hyperdense vessel or unexpected calcification. Skull: Normal. Negative for fracture or focal lesion. Sinuses/Orbits: No acute finding. Other: None. IMPRESSION: Normal CT evaluation of the head. Electronically Signed   By: Ted Mcalpineobrinka  Dimitrova M.D.   On: 12/19/2018 14:38   MAU Course: Orders Placed This Encounter  Procedures  . CT Head Wo Contrast  . CBC  . Comprehensive metabolic panel  . Protein / creatinine ratio, urine  . Urinalysis, Routine w reflex microscopic  . Measure blood pressure  . Insert peripheral IV  . Discharge patient   Meds ordered this encounter  Medications  . lactated ringers bolus 1,000 mL  . diphenhydrAMINE (BENADRYL) injection 25 mg  . dexamethasone (DECADRON) injection 10 mg  . prochlorperazine (COMPAZINE) injection 10 mg  . traMADol (ULTRAM) 50 MG tablet    Sig: Take 1-2 tablets (50-100 mg total) by mouth every 6 (six) hours as needed  for severe pain.    Dispense:  30 tablet    Refill:  0    Order Specific Question:   Supervising Provider    Answer:   Sloan Leiter [[id number]]    MDM: Headache resolved after migraine cocktail.  Patient reports feeling much better and looks as though she feels well.  Head CT normal. Normal blood pressures and labs make preeclampsia headache unlikely.  Patient's description sounds more like tension headache.  Recommend avoiding Tylenol, Rx Ultram to use sparingly and discussed comfort measures, massage, p.o.  hydration and treatment of nausea and vomiting to manage headache symptoms.  Discussed Hx, labs, exam w/ Dr. Rosana Hoes. Agrees w/ POC.  Considered possible DKA with persistent glycosuria at prenatal visits, mildly elevated anion gap of approximately 13 and mildly decreased bicarb of 19.  Patient okay for discharge, but will [facility] to [facility] gtt. to her appointment on 12/21/2018.  Will treat nausea and vomiting with Zofran to avoid dehydration and development of ketosis.   Assessment: 1. Pregnancy headache in second trimester   2. Chronic hypertension complicating or reason for care during pregnancy, second trimester   3. Glycosuria during pregnancy, not delivered     Plan: Discharge home in stable condition.  Preterm labor precautions and fetal kick counts Headache red flags reviewed. Follow-up Information    [facility] Follow up on 12/21/2018.   Why: For in-person prenatal visit and repeat labs. Contact information: [place] [address] [facility] up.   Specialty: Obstetrics and Gynecology Why: As Needed in pregnancy emergencies Contact information: [address] [id number] [place] [phone]          Allergies as of 12/19/2018   No Known Allergies     Medication List    TAKE these medications   cyclobenzaprine 10 MG tablet Commonly known as: FLEXERIL Take 1 tablet (10 mg total) by mouth every 8 (eight) hours as needed for muscle spasms.   DULoxetine 60 MG capsule Commonly known as: CYMBALTA Take 1 capsule (60 mg total) by mouth daily.   metroNIDAZOLE 500 MG tablet Commonly known as: FLAGYL Take 1 tablet (500 mg total) by mouth 2 (two) times daily.   prenatal vitamin w/FE, FA 27-1 MG Tabs tablet Take 1 tablet by mouth daily at 12 noon.   traMADol 50 MG tablet Commonly known as: Ultram Take 1-2 tablets  (50-100 mg total) by mouth every 6 (six) hours as needed for severe pain.       Tamala Julian, [place], [place] 12/19/2018 4:51 PM

## 2018-12-19 NOTE — MAU Note (Signed)
Pt sent from MD office for BP evaluation.  Reports H/A x3 days, unrelieved with Tylenol.  Also reports blurred vision and floaters.  Denies epigastric pain.  Denies LOF or VB.  Reports +FM.

## 2018-12-21 ENCOUNTER — Ambulatory Visit (INDEPENDENT_AMBULATORY_CARE_PROVIDER_SITE_OTHER): Payer: BC Managed Care – PPO | Admitting: Women's Health

## 2018-12-21 ENCOUNTER — Encounter: Payer: Self-pay | Admitting: Women's Health

## 2018-12-21 ENCOUNTER — Other Ambulatory Visit: Payer: Self-pay

## 2018-12-21 VITALS — BP 130/80 | HR 105 | Wt 258.5 lb

## 2018-12-21 DIAGNOSIS — B373 Candidiasis of vulva and vagina: Secondary | ICD-10-CM

## 2018-12-21 DIAGNOSIS — B3731 Acute candidiasis of vulva and vagina: Secondary | ICD-10-CM

## 2018-12-21 DIAGNOSIS — O0992 Supervision of high risk pregnancy, unspecified, second trimester: Secondary | ICD-10-CM | POA: Diagnosis not present

## 2018-12-21 DIAGNOSIS — Z1389 Encounter for screening for other disorder: Secondary | ICD-10-CM

## 2018-12-21 DIAGNOSIS — O10912 Unspecified pre-existing hypertension complicating pregnancy, second trimester: Secondary | ICD-10-CM

## 2018-12-21 DIAGNOSIS — N898 Other specified noninflammatory disorders of vagina: Secondary | ICD-10-CM

## 2018-12-21 DIAGNOSIS — O10919 Unspecified pre-existing hypertension complicating pregnancy, unspecified trimester: Secondary | ICD-10-CM | POA: Diagnosis not present

## 2018-12-21 DIAGNOSIS — Z3A26 26 weeks gestation of pregnancy: Secondary | ICD-10-CM

## 2018-12-21 DIAGNOSIS — Z331 Pregnant state, incidental: Secondary | ICD-10-CM

## 2018-12-21 DIAGNOSIS — O0990 Supervision of high risk pregnancy, unspecified, unspecified trimester: Secondary | ICD-10-CM

## 2018-12-21 LAB — POCT WET PREP (WET MOUNT)
Clue Cells Wet Prep Whiff POC: NEGATIVE
Trichomonas Wet Prep HPF POC: ABSENT

## 2018-12-21 LAB — POCT URINALYSIS DIPSTICK OB
Blood, UA: NEGATIVE
Glucose, UA: NEGATIVE
Ketones, UA: NEGATIVE
Leukocytes, UA: NEGATIVE
Nitrite, UA: NEGATIVE
POC,PROTEIN,UA: NEGATIVE

## 2018-12-21 MED ORDER — CYCLOBENZAPRINE HCL 10 MG PO TABS: 10.0000 mg | ORAL_TABLET | Freq: Three times a day (TID) | ORAL | 1 refills | Status: DC | PRN

## 2018-12-21 NOTE — Progress Notes (Signed)
HIGH-RISK PREGNANCY VISIT Patient name: [name] MRN [id number]  Date of birth: 11-14-1995 Chief Complaint:   High Risk Gestation  History of Present Illness:   [name] is a 23 y.o. G36P2002 female at [redacted]w[redacted]d with an Estimated Date of Delivery: 03/28/19 being seen today for ongoing management of a high-risk pregnancy complicated by [facility] no meds.  Today she reports feeling much better than 7/7 when she went to [facility] for severe headache, had neg head CT, migraine cocktail which relieved headache, +glucosuria w/ slightly abnormal CO2 and anion gap, K+ 3.3, per Dr. Rosana Hoes wanted repeat labs today.  Has not had to take any of ultram she was rx'd. Does request refill on flexeril for muscle spasms. Vulvovaginal itching for a few days.  Contractions: Not present. Vag. Bleeding: None.  Movement: Present. denies leaking of fluid.  Review of Systems:   Pertinent items are noted in HPI Denies abnormal vaginal discharge w/ itching/odor/irritation, headaches, visual changes, shortness of breath, chest pain, abdominal pain, severe nausea/vomiting, or problems with urination or bowel movements unless otherwise stated above. Pertinent History Reviewed:  Reviewed past medical,surgical, social, obstetrical and family history.  Reviewed problem list, medications and allergies. Physical Assessment:   Vitals:   12/21/18 1511  BP: 130/80  Pulse: (!) 105  Weight: 258 lb 8 oz (117.3 kg)  Body mass index is 40.49 kg/m.           Physical Examination:   General appearance: alert, well appearing, and in no distress  Mental status: alert, oriented to person, place, and time  Skin: warm & dry   Extremities: Edema: None    Cardiovascular: normal heart rate noted  Respiratory: normal respiratory effort, no distress  Abdomen: gravid, soft, non-tender  Pelvic: small amt thick nonodorous d/c        Fetal Status: Fetal Heart Rate (bpm): 158 Fundal Height: 28 cm Movement: Present    Fetal Surveillance  Testing today: doppler   Results for orders placed or performed in visit on 12/21/18 (from the past 24 hour(s))  POC Urinalysis Dipstick OB   Collection Time: 12/21/18  3:13 PM  Result Value Ref Range   Color, UA     Clarity, UA     Glucose, UA Negative Negative   Bilirubin, UA     Ketones, UA neg    Spec Grav, UA     Blood, UA neg    pH, UA     POC,PROTEIN,UA Negative Negative, Trace, Small (1+), Moderate (2+), Large (3+), 4+   Urobilinogen, UA     Nitrite, UA neg    Leukocytes, UA Negative Negative   Appearance     Odor    POCT Wet Prep [facility] [place])   Collection Time: 12/21/18  4:17 PM  Result Value Ref Range   Source Wet Prep POC vaginal    WBC, Wet Prep HPF POC none    Bacteria Wet Prep HPF POC Few Few   BACTERIA WET PREP MORPHOLOGY POC     Clue Cells Wet Prep HPF POC None None   Clue Cells Wet Prep Whiff POC Negative Whiff    Yeast Wet Prep HPF POC Few (A) None   KOH Wet Prep POC     Trichomonas Wet Prep HPF POC Absent Absent    Assessment & Plan:  1) High-risk pregnancy G3P2002 at [redacted]w[redacted]d with an Estimated Date of Delivery: 03/28/19   2) CHTN, stable, no meds, continue ASA  3) Glucosuria, x 2 (once here and  once in [facility]) w/ slightly abnormal labs, will repeat CBC & CMP today, schedule for GTT in AM  4) Muscle spasms> refilled flexeril  5) Vulvovaginal candida> otc monistat 7, no sex while using  Meds:  Meds ordered this encounter  Medications  . cyclobenzaprine (FLEXERIL) 10 MG tablet    Sig: Take 1 tablet (10 mg total) by mouth every 8 (eight) hours as needed for muscle spasms.    Dispense:  30 tablet    Refill:  1    Order Specific Question:   Supervising Provider    Answer:   Duane LopeEURE, LUTHER H [[id number]]    Labs/procedures today: cbc, cmp  Treatment Plan: Growth u/s @ 28, 33, 36wks     2x/wk testing nst/sono @ 36wks or weekly BPP   Deliver @ 39-40wks  Reviewed: Preterm labor symptoms and general obstetric precautions including but not limited to vaginal  bleeding, contractions, leaking of fluid and fetal movement were reviewed in detail with the patient.  All questions were answered.   Follow-up: Return for am for pn2 (no visit), cancel pn2 on 7/27 (keep u/s & visit).  Orders Placed This Encounter  Procedures  . CBC  . Comprehensive metabolic panel  . POC Urinalysis Dipstick OB  . POCT Wet Prep [facility] [place])   Cheral MarkerKimberly R Khloie Hamada CNM, [facility] LLC[facility]NP-BC 12/21/2018 4:18 PM

## 2018-12-21 NOTE — Patient Instructions (Signed)
Lucia Gaskins, I greatly value your feedback.  If you receive a survey following your visit with [place] today, we appreciate you taking the time to fill it out.  Thanks, Knute Neu, CNM, WHNP-BC   You will have your sugar test next visit.  Please do not eat or drink anything after midnight the night before you come, not even water.  You will be here for at least two hours.     [place]!!! It is now [place] at [facility] ([place], [place] [address]) Entrance located off of [address] parking   Call the office [phone]) or go to [facility] if:  You begin to have strong, frequent contractions  Your water breaks.  Sometimes it is a big gush of fluid, sometimes it is just a trickle that keeps getting your panties wet or running down your legs  You have vaginal bleeding.  It is normal to have a small amount of spotting if your cervix was checked.   You don't feel your baby moving like normal.  If you don't, get you something to eat and drink and lay down and focus on feeling your baby move.   If your baby is still not moving like normal, you should call the office or go to [place] Blood Pressure Monitoring for Patients   Your provider has recommended that you check your blood pressure (BP) at least once a week at home. If you do not have a blood pressure cuff at home, one will be provided for you. Contact your provider if you have not received your monitor within 1 week.   Helpful Tips for Accurate Home Blood Pressure Checks  . Don't smoke, exercise, or drink caffeine 30 minutes before checking your BP . Use the restroom before checking your BP (a full bladder can raise your pressure) . Relax in a comfortable upright chair . Feet on the ground . Left arm resting comfortably on a flat surface at the level of your heart . Legs uncrossed . Back supported . Sit quietly and don't talk . Place the cuff on  your bare arm . Adjust snuggly, so that only two fingertips can fit between your skin and the top of the cuff . Check 2 readings separated by at least one minute . Keep a log of your BP readings . For a visual, please reference this diagram: http://ccnc.care/bpdiagram  Provider Name: [facility]     Phone: [phone]  Zone 1: ALL CLEAR  Continue to monitor your symptoms:  . BP reading is less than 140 (top number) or less than 90 (bottom number)  . No right upper stomach pain . No headaches or seeing spots . No feeling nauseated or throwing up . No swelling in face and hands  Zone 2: CAUTION Call your doctor's office for any of the following:  . BP reading is greater than 140 (top number) or greater than 90 (bottom number)  . Stomach pain under your ribs in the middle or right side . Headaches or seeing spots . Feeling nauseated or throwing up . Swelling in face and hands  Zone 3: EMERGENCY  Seek immediate medical care if you have any of the following:  . BP reading is greater than160 (top number) or greater than 110 (bottom number) . Severe headaches not improving with Tylenol . Serious difficulty catching your breath . Any worsening symptoms from Zone 2  Second Trimester of Pregnancy The second trimester is from week 13 through week 28, months 4 through 6. The second trimester is often a time when you feel your best. Your body has also adjusted to being pregnant, and you begin to feel better physically. Usually, morning sickness has lessened or quit completely, you may have more energy, and you may have an increase in appetite. The second trimester is also a time when the fetus is growing rapidly. At the end of the sixth month, the fetus is about 9 inches long and weighs about 1 pounds. You will likely begin to feel the baby move (quickening) between 18 and 20 weeks of the pregnancy. BODY CHANGES Your body goes through many changes during pregnancy. The changes vary  from woman to woman.   Your weight will continue to increase. You will notice your lower abdomen bulging out.  You may begin to get stretch marks on your hips, abdomen, and breasts.  You may develop headaches that can be relieved by medicines approved by your health care provider.  You may urinate more often because the fetus is pressing on your bladder.  You may develop or continue to have heartburn as a result of your pregnancy.  You may develop constipation because certain hormones are causing the muscles that push waste through your intestines to slow down.  You may develop hemorrhoids or swollen, bulging veins (varicose veins).  You may have back pain because of the weight gain and pregnancy hormones relaxing your joints between the bones in your pelvis and as a result of a shift in weight and the muscles that support your balance.  Your breasts will continue to grow and be tender.  Your gums may bleed and may be sensitive to brushing and flossing.  Dark spots or blotches (chloasma, mask of pregnancy) may develop on your face. This will likely fade after the baby is born.  A dark line from your belly button to the pubic area (linea nigra) may appear. This will likely fade after the baby is born.  You may have changes in your hair. These can include thickening of your hair, rapid growth, and changes in texture. Some women also have hair loss during or after pregnancy, or hair that feels dry or thin. Your hair will most likely return to normal after your baby is born. WHAT TO EXPECT AT YOUR PRENATAL VISITS During a routine prenatal visit:  You will be weighed to make sure you and the fetus are growing normally.  Your blood pressure will be taken.  Your abdomen will be measured to track your baby's growth.  The fetal heartbeat will be listened to.  Any test results from the previous visit will be discussed. Your health care provider may ask you:  How you are feeling.  If  you are feeling the baby move.  If you have had any abnormal symptoms, such as leaking fluid, bleeding, severe headaches, or abdominal cramping.  If you have any questions. Other tests that may be performed during your second trimester include:  Blood tests that check for:  Low iron levels (anemia).  Gestational diabetes (between 24 and 28 weeks).  Rh antibodies.  Urine tests to check for infections, diabetes, or protein in the urine.  An ultrasound to confirm the proper growth and development of the baby.  An amniocentesis to check for possible genetic problems.  Fetal screens for spina bifida and Down syndrome. HOME CARE INSTRUCTIONS   Avoid all smoking, herbs, alcohol, and  unprescribed drugs. These chemicals affect the formation and growth of the baby.  Follow your health care provider's instructions regarding medicine use. There are medicines that are either safe or unsafe to take during pregnancy.  Exercise only as directed by your health care provider. Experiencing uterine cramps is a good sign to stop exercising.  Continue to eat regular, healthy meals.  Wear a good support bra for breast tenderness.  Do not use hot tubs, steam rooms, or saunas.  Wear your seat belt at all times when driving.  Avoid raw meat, uncooked cheese, cat litter boxes, and soil used by cats. These carry germs that can cause birth defects in the baby.  Take your prenatal vitamins.  Try taking a stool softener (if your health care provider approves) if you develop constipation. Eat more high-fiber foods, such as fresh vegetables or fruit and whole grains. Drink plenty of fluids to keep your urine clear or pale yellow.  Take warm sitz baths to soothe any pain or discomfort caused by hemorrhoids. Use hemorrhoid cream if your health care provider approves.  If you develop varicose veins, wear support hose. Elevate your feet for 15 minutes, 3-4 times a day. Limit salt in your diet.  Avoid  heavy lifting, wear low heel shoes, and practice good posture.  Rest with your legs elevated if you have leg cramps or low back pain.  Visit your dentist if you have not gone yet during your pregnancy. Use a soft toothbrush to brush your teeth and be gentle when you floss.  A sexual relationship may be continued unless your health care provider directs you otherwise.  Continue to go to all your prenatal visits as directed by your health care provider. SEEK MEDICAL CARE IF:   You have dizziness.  You have mild pelvic cramps, pelvic pressure, or nagging pain in the abdominal area.  You have persistent nausea, vomiting, or diarrhea.  You have a bad smelling vaginal discharge.  You have pain with urination. SEEK IMMEDIATE MEDICAL CARE IF:   You have a fever.  You are leaking fluid from your vagina.  You have spotting or bleeding from your vagina.  You have severe abdominal cramping or pain.  You have rapid weight gain or loss.  You have shortness of breath with chest pain.  You notice sudden or extreme swelling of your face, hands, ankles, feet, or legs.  You have not felt your baby move in over an hour.  You have severe headaches that do not go away with medicine.  You have vision changes. Document Released: 05/25/2001 Document Revised: 06/05/2013 Document Reviewed: 08/01/2012 [facility] Patient Information 2015 [place], [place]. This information is not intended to replace advice given to you by your health care provider. Make sure you discuss any questions you have with your health care provider.

## 2018-12-22 ENCOUNTER — Other Ambulatory Visit: Payer: BC Managed Care – PPO

## 2018-12-22 DIAGNOSIS — Z3A26 26 weeks gestation of pregnancy: Secondary | ICD-10-CM

## 2018-12-22 DIAGNOSIS — O0990 Supervision of high risk pregnancy, unspecified, unspecified trimester: Secondary | ICD-10-CM

## 2018-12-22 LAB — COMPREHENSIVE METABOLIC PANEL
ALT: 9 IU/L (ref 0–32)
AST: 10 IU/L (ref 0–40)
Albumin/Globulin Ratio: 1.1 — ABNORMAL LOW (ref 1.2–2.2)
Albumin: 3.3 g/dL — ABNORMAL LOW (ref 3.9–5.0)
Alkaline Phosphatase: 86 IU/L (ref 39–117)
BUN/Creatinine Ratio: 9 (ref 9–23)
BUN: 4 mg/dL — ABNORMAL LOW (ref 6–20)
Bilirubin Total: <0.2 mg/dL (ref 0.0–1.2)
CO2: 18 mmol/L — ABNORMAL LOW (ref 20–29)
Calcium: 9 mg/dL (ref 8.7–10.2)
Chloride: 104 mmol/L (ref 96–106)
Creatinine, Ser: 0.45 mg/dL — ABNORMAL LOW (ref 0.57–1.00)
GFR calc Af Amer: 161 mL/min/{1.73_m2} (ref >59)
GFR calc non Af Amer: 140 mL/min/{1.73_m2} (ref >59)
Globulin, Total: 3 g/dL (ref 1.5–4.5)
Glucose: 81 mg/dL (ref 65–99)
Potassium: 4 mmol/L (ref 3.5–5.2)
Sodium: 139 mmol/L (ref 134–144)
Total Protein: 6.3 g/dL (ref 6.0–8.5)

## 2018-12-22 LAB — CBC
Hematocrit: 33.6 % — ABNORMAL LOW (ref 34.0–46.6)
Hemoglobin: 11.4 g/dL (ref 11.1–15.9)
MCH: 30.5 pg (ref 26.6–33.0)
MCHC: 33.9 g/dL (ref 31.5–35.7)
MCV: 90 fL (ref 79–97)
Platelets: 219 10*3/uL (ref 150–450)
RBC: 3.74 x10E6/uL — ABNORMAL LOW (ref 3.77–5.28)
RDW: 13.9 % (ref 11.7–15.4)
WBC: 14.2 10*3/uL — ABNORMAL HIGH (ref 3.4–10.8)

## 2018-12-23 LAB — GLUCOSE TOLERANCE, 2 HOURS W/ 1HR
Glucose, 1 hour: 176 mg/dL (ref 65–179)
Glucose, 2 hour: 159 mg/dL — ABNORMAL HIGH (ref 65–152)
Glucose, Fasting: 92 mg/dL — ABNORMAL HIGH (ref 65–91)

## 2018-12-23 LAB — HIV ANTIBODY (ROUTINE TESTING W REFLEX): HIV Screen 4th Generation wRfx: NONREACTIVE

## 2018-12-23 LAB — ANTIBODY SCREEN: Antibody Screen: NEGATIVE

## 2018-12-23 LAB — RPR: RPR Ser Ql: NONREACTIVE

## 2018-12-25 ENCOUNTER — Other Ambulatory Visit: Payer: Self-pay

## 2018-12-25 ENCOUNTER — Encounter: Payer: Self-pay | Admitting: Women's Health

## 2018-12-25 ENCOUNTER — Ambulatory Visit (INDEPENDENT_AMBULATORY_CARE_PROVIDER_SITE_OTHER): Payer: BC Managed Care – PPO | Admitting: Obstetrics and Gynecology

## 2018-12-25 ENCOUNTER — Encounter: Payer: Self-pay | Admitting: Obstetrics and Gynecology

## 2018-12-25 VITALS — BP 146/89 | HR 123 | Wt 254.8 lb

## 2018-12-25 DIAGNOSIS — O24419 Gestational diabetes mellitus in pregnancy, unspecified control: Secondary | ICD-10-CM | POA: Insufficient documentation

## 2018-12-25 DIAGNOSIS — O0990 Supervision of high risk pregnancy, unspecified, unspecified trimester: Secondary | ICD-10-CM

## 2018-12-25 DIAGNOSIS — Z3A26 26 weeks gestation of pregnancy: Secondary | ICD-10-CM

## 2018-12-25 DIAGNOSIS — Z1389 Encounter for screening for other disorder: Secondary | ICD-10-CM

## 2018-12-25 DIAGNOSIS — Z331 Pregnant state, incidental: Secondary | ICD-10-CM

## 2018-12-25 DIAGNOSIS — O0992 Supervision of high risk pregnancy, unspecified, second trimester: Secondary | ICD-10-CM

## 2018-12-25 DIAGNOSIS — Z8632 Personal history of gestational diabetes: Secondary | ICD-10-CM | POA: Insufficient documentation

## 2018-12-25 LAB — POCT URINALYSIS DIPSTICK OB
Blood, UA: NEGATIVE
Glucose, UA: NEGATIVE
Ketones, UA: NEGATIVE
Leukocytes, UA: NEGATIVE
Nitrite, UA: NEGATIVE

## 2018-12-25 MED ORDER — LABETALOL HCL 100 MG PO TABS: 100.0000 mg | ORAL_TABLET | Freq: Two times a day (BID) | ORAL | 3 refills | Status: DC

## 2018-12-25 NOTE — Progress Notes (Signed)
Patient ID: [name], female   DOB: 09/10/1995, 23 y.o.   MRN: [id number]    [facility] Patient name: [name] MRN [id number]  Date of birth: 09/10/1995 Chief Complaint:   High Risk Gestation (abnormal glucose)  History of Present Illness:   [name] is a 23 y.o. 373P2002 female at 525w5d with an Estimated Date of Delivery: 03/28/19 being seen today for ongoing management of a high-risk pregnancy complicated by chronic HTN no meds. Had severe migraine feeling awful went to [facility] 12/19/2018 given migraine cocktail which helped. Today is resolved. Ct head was ordered and came back negative. Had abnormal PN2, sent mychart message to Selena BattenKim booker today saying that she still felt ill from life stressors. Has not had much of an appetite, going through separation of husband, dealing with work and her other children. As noticed dry mouth and is drinking and voiding frequently. Has decided to get her tubes tied. Describes her stress as "out of body". Has not vomited in last 24 hours. Is taking Zofran for nausea. Normal bowel fxn  Today she reports headache, nausea and situational stressors. Contractions: Not present.  .  Movement: Present. denies leaking of fluid.  Review of Systems:   Pertinent items are noted in HPI Denies abnormal vaginal discharge w/ itching/odor/irritation, headaches, visual changes, shortness of breath, chest pain, abdominal pain, severe nausea/vomiting, or problems with urination or bowel movements unless otherwise stated above. Pertinent History Reviewed:  Reviewed past medical,surgical, social, obstetrical and family history.  Reviewed problem list, medications and allergies. Physical Assessment:   Vitals:   12/25/18 1554  BP: (!) 146/89  Pulse: (!) 123  Weight: 254 lb 12.8 oz (115.6 kg)  Body mass index is 39.91 kg/m.           Physical Examination:   General appearance: alert, well appearing, and in no distress and overweight  Mental status:  alert, oriented to person, place, and time, normal mood, behavior, speech, dress, motor activity, and thought processes, affect appropriate to mood  Skin: warm & dry   Extremities: Edema: None    Cardiovascular: normal heart rate noted  Respiratory: normal respiratory effort, no distress  Abdomen: gravid, soft, non-tender fht 154. + FM  Pelvic: Cervical exam deferred         Fetal Status: Fetal Heart Rate (bpm): 154   Movement: Present    Fetal Surveillance Testing today: None   Results for orders placed or performed in visit on 12/25/18 (from the past 24 hour(s))  POC Urinalysis Dipstick OB   Collection Time: 12/25/18  4:00 PM  Result Value Ref Range   Color, UA     Clarity, UA     Glucose, UA Negative Negative   Bilirubin, UA     Ketones, UA neg    Spec Grav, UA     Blood, UA neg    pH, UA     POC,PROTEIN,UA Trace Negative, Trace, Small (1+), Moderate (2+), Large (3+), 4+   Urobilinogen, UA     Nitrite, UA neg    Leukocytes, UA Negative Negative   Appearance     Odor      Assessment & Plan:  1) High-risk pregnancy G3P2002 at 415w5d with an Estimated Date of Delivery: 03/28/19   2) CHTN, increasing  3) GDM A1, no meds, diet controlled  4) Situational stressors,   Meds:  Meds ordered this encounter  Medications  . labetalol (NORMODYNE) 100 MG tablet    Sig: Take 1  tablet (100 mg total) by mouth 2 (two) times daily.    Dispense:  60 tablet    Refill:  3   Labs/procedures today: None  Treatment Plan:  1. Rx Labetalol 100 mg BID 2. Referral to diet educator in [place] 3. F/u 1 week  Reviewed: Preterm labor symptoms and general obstetric precautions including but not limited to vaginal bleeding, contractions, leaking of fluid and fetal movement were reviewed in detail with the patient.  All questions were answered.  home bp cuff. Check bp weekly, let us know if >140/90.   Follow-up: Return in about 1 week (around 01/01/2019).  Orders Placed This Encounter   Procedures  . POC Urinalysis Dipstick OB   By signing my name below, I, Samul Dada, attest that this documentation has been prepared under the direction and in the presence of Jonnie Kind, MD. Electronically Signed: [place]. 12/25/18. 4:33 PM.  I personally performed the services described in this documentation, which was SCRIBED in my presence. The recorded information has been reviewed and considered accurate. It has been edited as necessary during review. Jonnie Kind, MD

## 2018-12-26 ENCOUNTER — Other Ambulatory Visit: Payer: Self-pay | Admitting: *Deleted

## 2018-12-26 DIAGNOSIS — O24410 Gestational diabetes mellitus in pregnancy, diet controlled: Secondary | ICD-10-CM

## 2018-12-26 DIAGNOSIS — O0990 Supervision of high risk pregnancy, unspecified, unspecified trimester: Secondary | ICD-10-CM

## 2018-12-26 MED ORDER — ACCU-CHEK GUIDE ME W/DEVICE KIT: 1.0000 | PACK | Freq: Four times a day (QID) | 0 refills | Status: DC

## 2018-12-26 MED ORDER — ACCU-CHEK GUIDE VI STRP: ORAL_STRIP | 12 refills | Status: DC

## 2018-12-28 ENCOUNTER — Other Ambulatory Visit: Payer: Self-pay | Admitting: Obstetrics and Gynecology

## 2018-12-28 DIAGNOSIS — O10919 Unspecified pre-existing hypertension complicating pregnancy, unspecified trimester: Secondary | ICD-10-CM

## 2018-12-29 ENCOUNTER — Ambulatory Visit (HOSPITAL_COMMUNITY): Payer: BC Managed Care – PPO | Admitting: Licensed Clinical Social Worker

## 2018-12-29 ENCOUNTER — Other Ambulatory Visit: Payer: Self-pay

## 2019-01-02 ENCOUNTER — Ambulatory Visit (INDEPENDENT_AMBULATORY_CARE_PROVIDER_SITE_OTHER): Payer: BC Managed Care – PPO | Admitting: Obstetrics and Gynecology

## 2019-01-02 ENCOUNTER — Encounter: Payer: Self-pay | Admitting: Obstetrics and Gynecology

## 2019-01-02 ENCOUNTER — Other Ambulatory Visit: Payer: Self-pay

## 2019-01-02 VITALS — BP 142/83 | HR 121 | Wt 257.4 lb

## 2019-01-02 DIAGNOSIS — Z1389 Encounter for screening for other disorder: Secondary | ICD-10-CM

## 2019-01-02 DIAGNOSIS — Z331 Pregnant state, incidental: Secondary | ICD-10-CM

## 2019-01-02 DIAGNOSIS — Z3A27 27 weeks gestation of pregnancy: Secondary | ICD-10-CM

## 2019-01-02 DIAGNOSIS — O0990 Supervision of high risk pregnancy, unspecified, unspecified trimester: Secondary | ICD-10-CM

## 2019-01-02 DIAGNOSIS — O0992 Supervision of high risk pregnancy, unspecified, second trimester: Secondary | ICD-10-CM

## 2019-01-02 LAB — POCT URINALYSIS DIPSTICK OB
Blood, UA: NEGATIVE
Glucose, UA: NEGATIVE
Ketones, UA: NEGATIVE
Leukocytes, UA: NEGATIVE
Nitrite, UA: NEGATIVE

## 2019-01-02 NOTE — Progress Notes (Signed)
Patient ID: [name], female   DOB: June 29, 1995, 23 y.o.   MRN: [id number]    [facility] PREGNANCY VISIT Patient name: [name] MRN [id number]  Date of birth: 1995-12-19 Chief Complaint:   High Risk Gestation (lower back pain)  History of Present Illness:   [name] is a 22 y.o. G54P2002 female at 30w6dwith an Estimated Date of Delivery: 03/28/19 being seen today for ongoing management of a high-risk pregnancy complicated by chronic HTN, gestational DM. Over weekend had headache, with vomiting had no way to take bp over weekend. Pain almost as bad as a migraine, CBG is 2 hours after eating 173.Fastings a between 10 and 11 am in high 90's. Developed dull nagging back pain over the weekend. Flexeril was for muscle pain before pregnancy, tramadol for headaches Flexeril didn't help, tramadol didn't help, position changes didn't help, heat or ice pack didn't help. Is missing many days of work due to headaches and back pain and has talked to supervisor about disability. Today she reports headache. Contractions: Not present.  .  Movement: Present. denies leaking of fluid.  Review of Systems:   Pertinent items are noted in HPI Denies abnormal vaginal discharge w/ itching/odor/irritation, headaches, visual changes, shortness of breath, chest pain, abdominal pain, severe nausea/vomiting, or problems with urination or bowel movements unless otherwise stated above. Pertinent History Reviewed:  Reviewed past medical,surgical, social, obstetrical and family history.  Reviewed problem list, medications and allergies. Physical Assessment:   Vitals:   01/02/19 1052  BP: (!) 142/83  Pulse: (!) 121  Weight: 257 lb 6.4 oz (116.8 kg)  Body mass index is 40.31 kg/m.           Physical Examination:   General appearance: alert, well appearing, and in no distress  Mental status: alert, oriented to person, place, and time, normal mood, behavior, speech, dress, motor activity, and thought processes,  affect appropriate to mood  Skin: warm & dry   Extremities: Edema: None    Cardiovascular: normal heart rate noted  Respiratory: normal respiratory effort, no distress  Abdomen: gravid, soft, non-tender  Pelvic: Cervical exam deferred         Fetal Status:     Movement: Present    Fetal Surveillance Testing today: None   Results for orders placed or performed in visit on 01/02/19 (from the past 24 hour(s))  POC Urinalysis Dipstick OB   Collection Time: 01/02/19 11:00 AM  Result Value Ref Range   Color, UA     Clarity, UA     Glucose, UA Negative Negative   Bilirubin, UA     Ketones, UA neg    Spec Grav, UA     Blood, UA neg    pH, UA     POC,PROTEIN,UA Negative Negative, Trace, Small (1+), Moderate (2+), Large (3+), 4+   Urobilinogen, UA     Nitrite, UA neg    Leukocytes, UA Negative Negative   Appearance     Odor      Assessment & Plan:  1) High-risk pregnancy G3P2002 at 217w6dith an Estimated Date of Delivery: 03/28/19   2) CHTN, stable, lebatolol 100 mg  3) GDM A1, Met formin 500 mg daily  Meds: No orders of the defined types were placed in this encounter.   Labs/procedures today: None  Treatment Plan:   Metformin 500 mg daily Check 3 blood sugars day Maternity disability.  Patient to bring in forms for review 30 mins of physical activity.  Daily  F/u in 1 weeks   Reviewed: Preterm labor symptoms and general obstetric precautions including but not limited to vaginal bleeding, contractions, leaking of fluid and fetal movement were reviewed in detail with the patient.   Follow-up: No follow-ups on file.  Orders Placed This Encounter  Procedures  . POC Urinalysis Dipstick OB   By signing my name below, I, Samul Dada, attest that this documentation has been prepared under the direction and in the presence of Jonnie Kind, MD. Electronically Signed: [place]. 01/02/19. 11:09 AM.  I personally performed the services described in this  documentation, which was SCRIBED in my presence. The recorded information has been reviewed and considered accurate. It has been edited as necessary during review. Jonnie Kind, MD

## 2019-01-08 ENCOUNTER — Telehealth: Payer: Self-pay | Admitting: Radiology

## 2019-01-08 ENCOUNTER — Ambulatory Visit (INDEPENDENT_AMBULATORY_CARE_PROVIDER_SITE_OTHER): Payer: BC Managed Care – PPO

## 2019-01-08 ENCOUNTER — Other Ambulatory Visit: Payer: BC Managed Care – PPO

## 2019-01-08 ENCOUNTER — Other Ambulatory Visit: Payer: Self-pay

## 2019-01-08 ENCOUNTER — Ambulatory Visit (INDEPENDENT_AMBULATORY_CARE_PROVIDER_SITE_OTHER): Payer: BC Managed Care – PPO | Admitting: Advanced Practice Midwife

## 2019-01-08 VITALS — BP 128/81 | HR 100 | Wt 255.0 lb

## 2019-01-08 DIAGNOSIS — Z1389 Encounter for screening for other disorder: Secondary | ICD-10-CM

## 2019-01-08 DIAGNOSIS — O0990 Supervision of high risk pregnancy, unspecified, unspecified trimester: Secondary | ICD-10-CM

## 2019-01-08 DIAGNOSIS — Z23 Encounter for immunization: Secondary | ICD-10-CM | POA: Diagnosis not present

## 2019-01-08 DIAGNOSIS — O10919 Unspecified pre-existing hypertension complicating pregnancy, unspecified trimester: Secondary | ICD-10-CM

## 2019-01-08 DIAGNOSIS — Z3A28 28 weeks gestation of pregnancy: Secondary | ICD-10-CM

## 2019-01-08 DIAGNOSIS — O10913 Unspecified pre-existing hypertension complicating pregnancy, third trimester: Secondary | ICD-10-CM

## 2019-01-08 DIAGNOSIS — O24410 Gestational diabetes mellitus in pregnancy, diet controlled: Secondary | ICD-10-CM

## 2019-01-08 DIAGNOSIS — Z331 Pregnant state, incidental: Secondary | ICD-10-CM

## 2019-01-08 DIAGNOSIS — Z362 Encounter for other antenatal screening follow-up: Secondary | ICD-10-CM | POA: Diagnosis not present

## 2019-01-08 DIAGNOSIS — O360130 Maternal care for anti-D [Rh] antibodies, third trimester, not applicable or unspecified: Secondary | ICD-10-CM

## 2019-01-08 DIAGNOSIS — O0993 Supervision of high risk pregnancy, unspecified, third trimester: Secondary | ICD-10-CM

## 2019-01-08 LAB — POCT URINALYSIS DIPSTICK OB
Blood, UA: NEGATIVE
Glucose, UA: NEGATIVE
Ketones, UA: NEGATIVE
Leukocytes, UA: NEGATIVE
Nitrite, UA: NEGATIVE

## 2019-01-08 MED ORDER — ASPIRIN EC 81 MG PO TBEC: 162.0000 mg | DELAYED_RELEASE_TABLET | Freq: Every day | ORAL | 6 refills | Status: DC

## 2019-01-08 MED ORDER — RHO D IMMUNE GLOBULIN 1500 UNIT/2ML IJ SOSY: 300.0000 ug | PREFILLED_SYRINGE | Freq: Once | INTRAMUSCULAR | Status: DC

## 2019-01-08 MED ORDER — ACCU-CHEK GUIDE ME W/DEVICE KIT: 1.0000 | PACK | Freq: Four times a day (QID) | 99 refills | Status: DC

## 2019-01-08 NOTE — Progress Notes (Signed)
HIGH-RISK PREGNANCY VISIT Patient name: [name] MRN [id number]  Date of birth: [birth year] Chief Complaint:   Routine Prenatal Visit  History of Present Illness:   [name] is a 23 y.o. G11P2002 female at 12w5dwith an Estimated Date of Delivery: 03/28/19 being seen today for ongoing management of a high-risk pregnancy complicated by AS9QZand CHTN currently on labetalol 1012mBID  Today she reports having had daily HA for months, has tried a lot of different drugs.   Tramadol helps. . Contractions: Not present. Vag. Bleeding: None.  Movement: Present. denies leaking of fluid. BS mostly normal, improving since last week and diet is getting better.  Review of Systems:   Pertinent items are noted in HPI Denies abnormal vaginal discharge w/ itching/odor/irritation, headaches, visual changes, shortness of breath, chest pain, abdominal pain, severe nausea/vomiting, or problems with urination or bowel movements unless otherwise stated above.    Pertinent History Reviewed:  Medical & Surgical Hx:   Past Medical History:  Diagnosis Date  . Anxiety   . Asthma    childhood  . Depression    post partum  . Dyspnea   . Headache   . Hypertension   . Irregular menstrual bleeding 01/31/2014  . PTSD (post-traumatic stress disorder)    pTSD  . Recurrent upper respiratory infection (URI)    Bronchitis  . Vaginal Pap smear, abnormal    HPV   Past Surgical History:  Procedure Laterality Date  . CHOLECYSTECTOMY    . CYST REMOVAL NECK    . TYMPANOSTOMY TUBE PLACEMENT    . TYMPANOSTOMY TUBE PLACEMENT     Family History  Problem Relation Age of Onset  . Bipolar disorder Maternal Grandmother   . Heart disease Maternal Grandfather   . Thyroid disease Paternal Grandmother   . Arthritis Mother   . Fibromyalgia Mother   . Diabetes Mother   . Cancer Mother        vaginal  . Birth defects Father     Current Outpatient Medications:  .  ACCU-CHEK GUIDE test strip, Use as instructed, Disp:  100 each, Rfl: 12 .  aspirin EC 81 MG tablet, Take 2 tablets (162 mg total) by mouth daily., Disp: 60 tablet, Rfl: 6 .  Blood Glucose Monitoring Suppl (ACCU-CHEK GUIDE ME) w/Device KIT, 1 each by Does not apply route 4 (four) times daily. Needs supplies to take BS QID (has device already), Disp: 1 kit, Rfl: prn .  cyclobenzaprine (FLEXERIL) 10 MG tablet, Take 1 tablet (10 mg total) by mouth every 8 (eight) hours as needed for muscle spasms., Disp: 30 tablet, Rfl: 1 .  DULoxetine (CYMBALTA) 60 MG capsule, Take 1 capsule (60 mg total) by mouth daily., Disp: 90 capsule, Rfl: 0 .  labetalol (NORMODYNE) 100 MG tablet, Take 1 tablet (100 mg total) by mouth 2 (two) times daily., Disp: 60 tablet, Rfl: 3 .  ondansetron (ZOFRAN ODT) 8 MG disintegrating tablet, Take 1 tablet (8 mg total) by mouth every 8 (eight) hours as needed for nausea or vomiting. (Patient not taking: Reported on 01/02/2019), Disp: 20 tablet, Rfl: 2 .  prenatal vitamin w/FE, FA (PRENATAL 1 + 1) 27-1 MG TABS tablet, Take 1 tablet by mouth daily at 12 noon., Disp: 30 each, Rfl: 12 .  traMADol (ULTRAM) 50 MG tablet, Take 1-2 tablets (50-100 mg total) by mouth every 6 (six) hours as needed for severe pain., Disp: 30 tablet, Rfl: 0 Social History: Reviewed -  reports that she has been smoking  cigarettes. She has a 9.00 pack-year smoking history. She has never used smokeless tobacco.   Physical Assessment:   Vitals:   01/08/19 1206  BP: 128/81  Pulse: 100  Weight: 255 lb (115.7 kg)  Body mass index is 39.94 kg/m.           Physical Examination:   General appearance: alert, well appearing, and in no distress  Mental status: alert, oriented to person, place, and time  Skin: warm & dry   Extremities: Edema: None    Cardiovascular: normal heart rate noted  Respiratory: normal respiratory effort, no distress  Abdomen: gravid, soft, non-tender  Pelvic: Cervical exam deferred         Fetal Status:     Movement: Present    Fetal  Surveillance Testing today: [place] 03+8 wks,cephalic,anterior placenta gr 1,cx 3.6 cm,afi 13 cm,EFW 1498 g 83%  Results for orders placed or performed in visit on 01/08/19 (from the past 24 hour(s))  POC Urinalysis Dipstick OB   Collection Time: 01/08/19 12:10 PM  Result Value Ref Range   Color, UA     Clarity, UA     Glucose, UA Negative Negative   Bilirubin, UA     Ketones, UA neg    Spec Grav, UA     Blood, UA neg    pH, UA     POC,PROTEIN,UA Trace Negative, Trace, Small (1+), Moderate (2+), Large (3+), 4+   Urobilinogen, UA     Nitrite, UA neg    Leukocytes, UA Negative Negative   Appearance     Odor      Assessment & Plan:  1) High-risk pregnancy G3P2002 at 65w5dwith an Estimated Date of Delivery: 03/28/19   2) CHTN, stable.  Treatment Plan:  Continue labatalol 1058mBID.Start baby ASA 16219mnot on it) Growth u/s 32, 36wks  weekly BPP   @ 32 weeks (may add weekly NST prn);  Deliver @ 39 583) A1Dm (last note says metformin, but incorrect), stable.  Treatment Plan:  Never got a call to schedule nutrition appt; referral resent. Continue QID blood sugar testing, [facility]: Return in about 24 days (around 02/01/2019) for 8/20 for US/HROB (go ahead and schedule weeklies for a bit); also 2 weeks from today for web ex HROB.  All questions were answered. has home bp cuff. Check bp weekly, let us [place]ow if >140/90.   Future Appointments  Date Time Provider DepEtna/03/2019  2:00 PM [facility]oma [facility]/2020 11:00 AM HisNorman ClayD BH-BHRA None  02/01/2019 11:00 AM [facility] - FTOBGYN US KoreaH-FTIMG None    Orders Placed This Encounter  Procedures  . US KoreaTAL BPP WO NON STRESS  . US [place] Cord Doppler  . US [place] Follow Up  . RHO (D) Immune Globulin  . Tdap vaccine greater than or equal to 7yo IM  . Amb Referral to Nutrition and Diabetic E  . POC Urinalysis Dipstick OB   FraChristin FudgeM 01/08/2019 1:04 PM

## 2019-01-08 NOTE — Telephone Encounter (Signed)
Left message for patient to call [facility] to schedule New Headache appointment with Allie Dimmer

## 2019-01-08 NOTE — Progress Notes (Signed)
[place] 18+4 wks,cephalic,anterior placenta gr 1,cx 3.6 cm,afi 13 cm,EFW 1498 g 83%

## 2019-01-09 ENCOUNTER — Telehealth: Payer: Self-pay | Admitting: Family Medicine

## 2019-01-09 NOTE — Telephone Encounter (Signed)
attempted to call patient with her diabetes education appointment ( 8/12 @ 2:00). No answer, left voicemail with appointment information. Patient instructed to give the office a call back if she is needing to reschedule. Appointment reminder mailed.

## 2019-01-12 ENCOUNTER — Other Ambulatory Visit: Payer: Self-pay | Admitting: Adult Health

## 2019-01-15 ENCOUNTER — Other Ambulatory Visit: Payer: Self-pay | Admitting: Obstetrics and Gynecology

## 2019-01-15 MED ORDER — TRAMADOL HCL 50 MG PO TABS: 50.0000 mg | ORAL_TABLET | Freq: Four times a day (QID) | ORAL | 0 refills | Status: DC | PRN

## 2019-01-15 NOTE — Progress Notes (Signed)
Tramadol initiated at [facility] for h/a renewed. If pt has inadequate relief from 50 mg, would consider change to Fiorinal.

## 2019-01-22 ENCOUNTER — Ambulatory Visit (INDEPENDENT_AMBULATORY_CARE_PROVIDER_SITE_OTHER): Payer: BC Managed Care – PPO | Admitting: Women's Health

## 2019-01-22 ENCOUNTER — Other Ambulatory Visit: Payer: Self-pay

## 2019-01-22 VITALS — BP 142/86 | HR 100 | Wt 253.0 lb

## 2019-01-22 DIAGNOSIS — O0993 Supervision of high risk pregnancy, unspecified, third trimester: Secondary | ICD-10-CM

## 2019-01-22 DIAGNOSIS — Z331 Pregnant state, incidental: Secondary | ICD-10-CM

## 2019-01-22 DIAGNOSIS — Z3A30 30 weeks gestation of pregnancy: Secondary | ICD-10-CM

## 2019-01-22 DIAGNOSIS — O24419 Gestational diabetes mellitus in pregnancy, unspecified control: Secondary | ICD-10-CM

## 2019-01-22 DIAGNOSIS — O0990 Supervision of high risk pregnancy, unspecified, unspecified trimester: Secondary | ICD-10-CM

## 2019-01-22 DIAGNOSIS — O10913 Unspecified pre-existing hypertension complicating pregnancy, third trimester: Secondary | ICD-10-CM

## 2019-01-22 DIAGNOSIS — Z1389 Encounter for screening for other disorder: Secondary | ICD-10-CM

## 2019-01-22 DIAGNOSIS — O10919 Unspecified pre-existing hypertension complicating pregnancy, unspecified trimester: Secondary | ICD-10-CM

## 2019-01-22 LAB — POCT URINALYSIS DIPSTICK OB
Blood, UA: NEGATIVE
Glucose, UA: NEGATIVE
Ketones, UA: NEGATIVE
Leukocytes, UA: NEGATIVE
Nitrite, UA: NEGATIVE
POC,PROTEIN,UA: NEGATIVE

## 2019-01-22 MED ORDER — METFORMIN HCL 500 MG PO TABS: 500.0000 mg | ORAL_TABLET | Freq: Every day | ORAL | 1 refills | Status: DC

## 2019-01-22 NOTE — Patient Instructions (Signed)
Lucia Gaskins, I greatly value your feedback.  If you receive a survey following your visit with [place] today, we appreciate you taking the time to fill it out.  Thanks, Knute Neu, CNM, [facility]  [place]!!! It is now [place] at [facility] ([place], [place] [address]) Entrance located off of [address] parking   Go to [email or web address] to register for FREE online childbirth classes    Call the office [phone]) or go to [facility] if:  You begin to have strong, frequent contractions  Your water breaks.  Sometimes it is a big gush of fluid, sometimes it is just a trickle that keeps getting your panties wet or running down your legs  You have vaginal bleeding.  It is normal to have a small amount of spotting if your cervix was checked.   You don't feel your baby moving like normal.  If you don't, get you something to eat and drink and lay down and focus on feeling your baby move.  You should feel at least 10 movements in 2 hours.  If you don't, you should call the office or go to [place] Blood Pressure Monitoring for Patients   Your provider has recommended that you check your blood pressure (BP) at least once a week at home. If you do not have a blood pressure cuff at home, one will be provided for you. Contact your provider if you have not received your monitor within 1 week.   Helpful Tips for Accurate Home Blood Pressure Checks  . Don't smoke, exercise, or drink caffeine 30 minutes before checking your BP . Use the restroom before checking your BP (a full bladder can raise your pressure) . Relax in a comfortable upright chair . Feet on the ground . Left arm resting comfortably on a flat surface at the level of your heart . Legs uncrossed . Back supported . Sit quietly and don't talk . Place the cuff on your bare arm . Adjust snuggly, so that only two fingertips can fit between your skin and  the top of the cuff . Check 2 readings separated by at least one minute . Keep a log of your BP readings . For a visual, please reference this diagram: http://ccnc.care/bpdiagram  Provider Name: [facility]     Phone: [phone]  Zone 1: ALL CLEAR  Continue to monitor your symptoms:  . BP reading is less than 140 (top number) or less than 90 (bottom number)  . No right upper stomach pain . No headaches or seeing spots . No feeling nauseated or throwing up . No swelling in face and hands  Zone 2: CAUTION Call your doctor's office for any of the following:  . BP reading is greater than 140 (top number) or greater than 90 (bottom number)  . Stomach pain under your ribs in the middle or right side . Headaches or seeing spots . Feeling nauseated or throwing up . Swelling in face and hands  Zone 3: EMERGENCY  Seek immediate medical care if you have any of the following:  . BP reading is greater than160 (top number) or greater than 110 (bottom number) . Severe headaches not improving with Tylenol . Serious difficulty catching your breath . Any worsening symptoms from Zone 2    Preterm Labor and Birth Information  The normal length of a pregnancy is 39-41 weeks. Preterm labor is when labor starts  before 37 completed weeks of pregnancy. What are the risk factors for preterm labor? Preterm labor is more likely to occur in women who:  Have certain infections during pregnancy such as a bladder infection, sexually transmitted infection, or infection inside the uterus (chorioamnionitis).  Have a shorter-than-normal cervix.  Have gone into preterm labor before.  Have had surgery on their cervix.  Are younger than age 79 or older than age 80.  Are African American.  Are pregnant with twins or multiple babies (multiple gestation).  Take street drugs or smoke while pregnant.  Do not gain enough weight while pregnant.  Became pregnant shortly after having been pregnant.  What are the symptoms of preterm labor? Symptoms of preterm labor include:  Cramps similar to those that can happen during a menstrual period. The cramps may happen with diarrhea.  Pain in the abdomen or lower back.  Regular uterine contractions that may feel like tightening of the abdomen.  A feeling of increased pressure in the pelvis.  Increased watery or bloody mucus discharge from the vagina.  Water breaking (ruptured amniotic sac). Why is it important to recognize signs of preterm labor? It is important to recognize signs of preterm labor because babies who are born prematurely may not be fully developed. This can put them at an increased risk for:  Long-term (chronic) heart and lung problems.  Difficulty immediately after birth with regulating body systems, including blood sugar, body temperature, heart rate, and breathing rate.  Bleeding in the brain.  Cerebral palsy.  Learning difficulties.  Death. These risks are highest for babies who are born before 50 weeks of pregnancy. How is preterm labor treated? Treatment depends on the length of your pregnancy, your condition, and the health of your baby. It may involve:  Having a stitch (suture) placed in your cervix to prevent your cervix from opening too early (cerclage).  Taking or being given medicines, such as: ? Hormone medicines. These may be given early in pregnancy to help support the pregnancy. ? Medicine to stop contractions. ? Medicines to help mature the baby's lungs. These may be prescribed if the risk of delivery is high. ? Medicines to prevent your baby from developing cerebral palsy. If the labor happens before 34 weeks of pregnancy, you may need to stay in the hospital. What should I do if I think I am in preterm labor? If you think that you are going into preterm labor, call your health care provider right away. How can I prevent preterm labor in future pregnancies? To increase your chance of having a  full-term pregnancy:  Do not use any tobacco products, such as cigarettes, chewing tobacco, and e-cigarettes. If you need help quitting, ask your health care provider.  Do not use street drugs or medicines that have not been prescribed to you during your pregnancy.  Talk with your health care provider before taking any herbal supplements, even if you have been taking them regularly.  Make sure you gain a healthy amount of weight during your pregnancy.  Watch for infection. If you think that you might have an infection, get it checked right away.  Make sure to tell your health care provider if you have gone into preterm labor before. This information is not intended to replace advice given to you by your health care provider. Make sure you discuss any questions you have with your health care provider. Document Released: 08/21/2003 Document Revised: 09/22/2018 Document Reviewed: 10/22/2015 Elsevier Patient Education  2020 [employer].

## 2019-01-22 NOTE — Progress Notes (Signed)
HIGH-RISK PREGNANCY VISIT Patient name: [name] MRN [id number]  Date of birth: [birth year] Chief Complaint:   Routine Prenatal Visit  History of Present Illness:   [name] is a 23 y.o. G44P2002 female at [redacted]w[redacted]d with an Estimated Date of Delivery: 03/28/19 being seen today for ongoing management of a high-risk pregnancy complicated by [facility] currently on Labetalol 100mg  BID; A2DM- added metformin today Today she reports small blood clots randomly last week, none since Sat. No recent sex.. Denies abnormal discharge, itching/odor/irritation. FBS 98-99, 2hr pp all normal. Has started cutting back on sweets, not so much all carbs. Drinking a Dr. Malachi Bonds today. Has appt w/ Rise Paganini on Wed.  Contractions: Not present. Vag. Bleeding: None.  Movement: Present. denies leaking of fluid.  Review of Systems:   Pertinent items are noted in HPI Denies abnormal vaginal discharge w/ itching/odor/irritation, headaches, visual changes, shortness of breath, chest pain, abdominal pain, severe nausea/vomiting, or problems with urination or bowel movements unless otherwise stated above. Pertinent History Reviewed:  Reviewed past medical,surgical, social, obstetrical and family history.  Reviewed problem list, medications and allergies. Physical Assessment:   Vitals:   01/22/19 1405  BP: (!) 142/86  Pulse: 100  Weight: 253 lb (114.8 kg)  Body mass index is 39.63 kg/m.           Physical Examination:   General appearance: alert, well appearing, and in no distress  Mental status: alert, oriented to person, place, and time  Skin: warm & dry   Extremities: Edema: None    Cardiovascular: normal heart rate noted  Respiratory: normal respiratory effort, no distress  Abdomen: gravid, soft, non-tender  Pelvic: spec exam by [employer], SNP- vagina/cervix normal, no evidence of blood, normal d/c         Fetal Status: Fetal Heart Rate (bpm): 142 Fundal Height: 32 cm Movement: Present    Fetal  Surveillance Testing today: doppler   Results for orders placed or performed in visit on 01/22/19 (from the past 24 hour(s))  POC Urinalysis Dipstick OB   Collection Time: 01/22/19  2:11 PM  Result Value Ref Range   Color, UA     Clarity, UA     Glucose, UA Negative Negative   Bilirubin, UA     Ketones, UA neg    Spec Grav, UA     Blood, UA neg    pH, UA     POC,PROTEIN,UA Negative Negative, Trace, Small (1+), Moderate (2+), Large (3+), 4+   Urobilinogen, UA     Nitrite, UA neg    Leukocytes, UA Negative Negative   Appearance     Odor      Assessment & Plan:  1) High-risk pregnancy G3P2002 at [redacted]w[redacted]d with an Estimated Date of Delivery: 03/28/19   2) A2DM, unstable, rx metformin 500mg  PM   3) CHTN, stable on Labetalol 100mg  BID, ASA, reviewed pre-e s/s, reasons to seek care  4) Vaginal bleeding> resolved  Meds:  Meds ordered this encounter  Medications  . metFORMIN (GLUCOPHAGE) 500 MG tablet    Sig: Take 1 tablet (500 mg total) by mouth at bedtime.    Dispense:  30 tablet    Refill:  1    Order Specific Question:   Supervising Provider    Answer:   Florian Buff [[id number]]    Labs/procedures today: spec exam  Treatment Plan: Growth u/s @ 32, 36wks    2x/wk testing nst/sono @ 32wks or weekly BPP    Deliver 39wks (37wks  if poor control)____   Reviewed: Preterm labor symptoms and general obstetric precautions including but not limited to vaginal bleeding, contractions, leaking of fluid and fetal movement were reviewed in detail with the patient.  All questions were answered.   Follow-up: Return in about 10 days (around 02/01/2019) for As scheduled; add NSTs w/ nurse on Mondays starting 8/24.  Orders Placed This Encounter  Procedures  . US OB Follow Up  . POC Urinalysis Dipstick OB   Cheral MarkerKimberly R Montrice Gracey CNM, [facility]WHNP-BC 01/22/2019 2:52 PM
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
## 2019-01-24 ENCOUNTER — Other Ambulatory Visit: Payer: Self-pay

## 2019-01-24 ENCOUNTER — Encounter: Payer: BC Managed Care – PPO | Attending: Obstetrics & Gynecology | Admitting: *Deleted

## 2019-01-24 ENCOUNTER — Ambulatory Visit: Payer: BC Managed Care – PPO | Admitting: *Deleted

## 2019-01-24 DIAGNOSIS — O24419 Gestational diabetes mellitus in pregnancy, unspecified control: Secondary | ICD-10-CM | POA: Diagnosis not present

## 2019-01-24 DIAGNOSIS — O0990 Supervision of high risk pregnancy, unspecified, unspecified trimester: Secondary | ICD-10-CM | POA: Diagnosis not present

## 2019-01-24 DIAGNOSIS — Z713 Dietary counseling and surveillance: Secondary | ICD-10-CM | POA: Diagnosis not present

## 2019-01-24 NOTE — Progress Notes (Signed)
  Patient was seen on 01/24/2019 for Gestational Diabetes self-management. EDD 03/28/2019. Patient states no history of GDM. She states she sleeps until about 11 AM when she has her first meal. Diet history obtained. Patient eats fair variety of all food groups. Beverages include regular soda and some water.  The following learning objectives were met by the patient :   States the definition of Gestational Diabetes  States why dietary management is important in controlling blood glucose  Describes the effects of carbohydrates on blood glucose levels  Demonstrates ability to create a balanced meal plan  Demonstrates carbohydrate counting   States when to check blood glucose levels  Demonstrates proper blood glucose monitoring techniques  States the effect of stress and exercise on blood glucose levels  States the importance of limiting caffeine and abstaining from alcohol and smoking  Plan:  Aim for 3 Carb Choices per meal (45 grams) +/- 1 either way  Aim for 1-2 Carbs per snack Begin reading food labels for Total Carbohydrate of foods If OK with your MD, consider  increasing your activity level by walking, Arm Chair Exercises or other activity daily as tolerated. Swimming is great too. Begin checking BG before breakfast and 2 hours after first bite of breakfast, lunch and dinner as directed by MD  Bring Log Book/Sheet and meter to every medical appointment OR use Baby Scripts Take medication if directed by MD  Patient already has a meter: Accu Chek Guide And is testing pre breakfast and 2 hours each meal as directed by MD Review of Log Book shows: Fasting BG running 95-100, post meal under 120 mg/dl  Patient instructed to monitor glucose levels: FBS: 60 - 95 mg/dl 2 hour: <120 mg/dl  Patient received the following handouts:  Nutrition Diabetes and Pregnancy  Carbohydrate Counting List  BG Log Sheet  Patient will be seen for follow-up as needed.

## 2019-01-25 ENCOUNTER — Telehealth (HOSPITAL_COMMUNITY): Payer: Self-pay | Admitting: Psychiatry

## 2019-01-25 ENCOUNTER — Ambulatory Visit (HOSPITAL_COMMUNITY): Payer: BC Managed Care – PPO | Admitting: Psychiatry

## 2019-01-25 NOTE — Telephone Encounter (Signed)
Sent link for video visit through [employer] [facility]. Patient did not sign in. Called the patient  twice for appointment scheduled today. The patient did not answer the phone. Left voice message to contact the office.  

## 2019-02-01 ENCOUNTER — Ambulatory Visit (INDEPENDENT_AMBULATORY_CARE_PROVIDER_SITE_OTHER): Payer: BC Managed Care – PPO

## 2019-02-01 ENCOUNTER — Other Ambulatory Visit: Payer: Self-pay

## 2019-02-01 DIAGNOSIS — Z3A32 32 weeks gestation of pregnancy: Secondary | ICD-10-CM | POA: Diagnosis not present

## 2019-02-01 DIAGNOSIS — O10913 Unspecified pre-existing hypertension complicating pregnancy, third trimester: Secondary | ICD-10-CM

## 2019-02-01 DIAGNOSIS — O10919 Unspecified pre-existing hypertension complicating pregnancy, unspecified trimester: Secondary | ICD-10-CM

## 2019-02-01 NOTE — Progress Notes (Signed)
[place] 80+9 wks,cephalic,fhr 983 bpm,anterior placenta gr 3,normal ovaries bilat,BPP 8/8,RI .68,.67,.66,.61=66%,EFW 2011 g 54%

## 2019-02-05 ENCOUNTER — Other Ambulatory Visit: Payer: Self-pay

## 2019-02-05 ENCOUNTER — Ambulatory Visit (INDEPENDENT_AMBULATORY_CARE_PROVIDER_SITE_OTHER): Payer: BC Managed Care – PPO | Admitting: *Deleted

## 2019-02-05 VITALS — BP 132/74 | HR 101 | Wt 253.0 lb

## 2019-02-05 DIAGNOSIS — O10913 Unspecified pre-existing hypertension complicating pregnancy, third trimester: Secondary | ICD-10-CM

## 2019-02-05 DIAGNOSIS — Z1389 Encounter for screening for other disorder: Secondary | ICD-10-CM

## 2019-02-05 DIAGNOSIS — O0990 Supervision of high risk pregnancy, unspecified, unspecified trimester: Secondary | ICD-10-CM

## 2019-02-05 DIAGNOSIS — Z331 Pregnant state, incidental: Secondary | ICD-10-CM

## 2019-02-05 LAB — POCT URINALYSIS DIPSTICK OB
Blood, UA: NEGATIVE
Glucose, UA: NEGATIVE
Ketones, UA: NEGATIVE
Leukocytes, UA: NEGATIVE
Nitrite, UA: NEGATIVE

## 2019-02-08 ENCOUNTER — Other Ambulatory Visit: Payer: Self-pay

## 2019-02-08 ENCOUNTER — Encounter: Payer: Self-pay | Admitting: Advanced Practice Midwife

## 2019-02-08 ENCOUNTER — Ambulatory Visit (INDEPENDENT_AMBULATORY_CARE_PROVIDER_SITE_OTHER): Payer: BC Managed Care – PPO

## 2019-02-08 ENCOUNTER — Ambulatory Visit (INDEPENDENT_AMBULATORY_CARE_PROVIDER_SITE_OTHER): Payer: BC Managed Care – PPO | Admitting: Advanced Practice Midwife

## 2019-02-08 VITALS — BP 135/77 | HR 99

## 2019-02-08 DIAGNOSIS — O10919 Unspecified pre-existing hypertension complicating pregnancy, unspecified trimester: Secondary | ICD-10-CM

## 2019-02-08 DIAGNOSIS — O10913 Unspecified pre-existing hypertension complicating pregnancy, third trimester: Secondary | ICD-10-CM

## 2019-02-08 DIAGNOSIS — O0990 Supervision of high risk pregnancy, unspecified, unspecified trimester: Secondary | ICD-10-CM

## 2019-02-08 DIAGNOSIS — Z3A33 33 weeks gestation of pregnancy: Secondary | ICD-10-CM

## 2019-02-08 DIAGNOSIS — Z331 Pregnant state, incidental: Secondary | ICD-10-CM

## 2019-02-08 DIAGNOSIS — O0993 Supervision of high risk pregnancy, unspecified, third trimester: Secondary | ICD-10-CM

## 2019-02-08 DIAGNOSIS — Z1389 Encounter for screening for other disorder: Secondary | ICD-10-CM

## 2019-02-08 LAB — POCT URINALYSIS DIPSTICK OB
Blood, UA: NEGATIVE
Glucose, UA: NEGATIVE
Leukocytes, UA: NEGATIVE
Nitrite, UA: NEGATIVE

## 2019-02-08 MED ORDER — BLOOD PRESSURE MONITOR AUTOMAT DEVI: 0 refills | Status: DC

## 2019-02-08 NOTE — Progress Notes (Signed)
[place] 76+1 wks,cephalic,anterior placenta gr 3,BPP 8/8,FHR 154 BPM,normal ovaries bilat,RI .62,.64,.57=55%,AFI 14.6 cm

## 2019-02-08 NOTE — Progress Notes (Signed)
HIGH-RISK PREGNANCY VISIT Patient name: [name] MRN [id number]  Date of birth: 10-Aug-1995 Chief Complaint:   Routine Prenatal Visit (BPP)  History of Present Illness:   [name] is a 23 y.o. G49P2002 female at 38w1dwith an Estimated Date of Delivery: 03/28/19 being seen today for ongoing management of a high-risk pregnancy complicated by AQ7YPcurrently on metformin '500mg'$  qhs and CHTN currently on Labetalol '100mg'$  BID  Today she reports RUQ pain. Sort of a stabbing quick pain every now and then for 2 weeks.  Doesn't have a gallbladder. Has't been checking BS (no good reason).  Eating better, went to class on DM. .Marland KitchenContractions: Not present. Vag. Bleeding: None.  Movement: Present. denies leaking of fluid.  Review of Systems:   Pertinent items are noted in HPI Denies abnormal vaginal discharge w/ itching/odor/irritation, headaches, visual changes, shortness of breath, chest pain, abdominal pain, severe nausea/vomiting, or problems with urination or bowel movements unless otherwise stated above.    Pertinent History Reviewed:  Medical & Surgical Hx:   Past Medical History:  Diagnosis Date  . Anxiety   . Asthma    childhood  . Depression    post partum  . Dyspnea   . Headache   . Hypertension   . Irregular menstrual bleeding 01/31/2014  . PTSD (post-traumatic stress disorder)    pTSD  . Recurrent upper respiratory infection (URI)    Bronchitis  . Vaginal Pap smear, abnormal    HPV   Past Surgical History:  Procedure Laterality Date  . CHOLECYSTECTOMY    . CYST REMOVAL NECK    . TYMPANOSTOMY TUBE PLACEMENT    . TYMPANOSTOMY TUBE PLACEMENT     Family History  Problem Relation Age of Onset  . Bipolar disorder Maternal Grandmother   . Heart disease Maternal Grandfather   . Thyroid disease Paternal Grandmother   . Arthritis Mother   . Fibromyalgia Mother   . Diabetes Mother   . Cancer Mother        vaginal  . Birth defects Father     Current Outpatient  Medications:  .  ACCU-CHEK GUIDE test strip, Use as instructed, Disp: 100 each, Rfl: 12 .  aspirin EC 81 MG tablet, Take 2 tablets (162 mg total) by mouth daily., Disp: 60 tablet, Rfl: 6 .  Blood Glucose Monitoring Suppl (ACCU-CHEK GUIDE ME) w/Device KIT, 1 each by Does not apply route 4 (four) times daily. Needs supplies to take BS QID (has device already), Disp: 1 kit, Rfl: prn .  cyclobenzaprine (FLEXERIL) 10 MG tablet, Take 1 tablet (10 mg total) by mouth every 8 (eight) hours as needed for muscle spasms., Disp: 30 tablet, Rfl: 1 .  DULoxetine (CYMBALTA) 60 MG capsule, Take 1 capsule (60 mg total) by mouth daily., Disp: 90 capsule, Rfl: 0 .  labetalol (NORMODYNE) 100 MG tablet, Take 1 tablet (100 mg total) by mouth 2 (two) times daily., Disp: 60 tablet, Rfl: 3 .  metFORMIN (GLUCOPHAGE) 500 MG tablet, Take 1 tablet (500 mg total) by mouth at bedtime., Disp: 30 tablet, Rfl: 1 .  prenatal vitamin w/FE, FA (PRENATAL 1 + 1) 27-1 MG TABS tablet, Take 1 tablet by mouth daily at 12 noon., Disp: 30 each, Rfl: 12 .  Blood Pressure Monitoring (BLOOD PRESSURE MONITOR AUTOMAT) DEVI, Take BP at home daily.  Alert u[place]if >140/90 more than once., Disp: 1 Device, Rfl: 0 .  ondansetron (ZOFRAN ODT) 8 MG disintegrating tablet, Take 1 tablet (8 mg total) by  mouth every 8 (eight) hours as needed for nausea or vomiting. (Patient not taking: Reported on 01/02/2019), Disp: 20 tablet, Rfl: 2 .  traMADol (ULTRAM) 50 MG tablet, Take 1 tablet (50 mg total) by mouth every 6 (six) hours as needed for severe pain (headache). (Patient not taking: Reported on 01/22/2019), Disp: 30 tablet, Rfl: 0 Social History: Reviewed -  reports that she has been smoking cigarettes. She has a 9.00 pack-year smoking history. She has never used smokeless tobacco.   Physical Assessment:   Vitals:   02/08/19 0908  BP: 135/77  Pulse: 99  There is no height or weight on file to calculate BMI.           Physical Examination:   General  appearance: alert, well appearing, and in no distress  Mental status: alert, oriented to person, place, and time  Skin: warm & dry   Extremities: Edema: None    Cardiovascular: normal heart rate noted  Respiratory: normal respiratory effort, no distress  Abdomen: gravid, soft,RUQ non tender to palpation  Pelvic: Cervical exam deferred         Fetal Status:     Movement: Present    Fetal Surveillance Testing today: [place] 63+8 wks,cephalic,anterior placenta gr 3,BPP 8/8,FHR 154 BPM,normal ovaries bilat,RI .62,.64,.57=55%,AFI 14.6 cm    Results for orders placed or performed in visit on 02/08/19 (from the past 24 hour(s))  POC Urinalysis Dipstick OB   Collection Time: 02/08/19  9:09 AM  Result Value Ref Range   Color, UA     Clarity, UA     Glucose, UA Negative Negative   Bilirubin, UA     Ketones, UA small    Spec Grav, UA     Blood, UA neg    pH, UA     POC,PROTEIN,UA Small (1+) Negative, Trace, Small (1+), Moderate (2+), Large (3+), 4+   Urobilinogen, UA     Nitrite, UA neg    Leukocytes, UA Negative Negative   Appearance     Odor      Assessment & Plan:  1) High-risk pregnancy G3P2002 at 90w1dwith an Estimated Date of Delivery: 03/28/19   2) CHTN, stable.  Treatment Plan:  NST on mondays, US/HROB on thursdays. IOL 39 weeks. Check labs today d/t RUQ pain.   3) A2DM, ?  Strongly encouraged to start QID BS testing;  .  Treatment Plan:  Per CHTN testing  Labs/procedures today: none    Future Appointments  Date Time Provider DDowney 02/12/2019  9:00 [facility]  02/15/2019  8:30 AM [facility] - FTOBGYN [place]FTIMG None  02/15/2019  9:30 AM BRoma Schanz CNM [facility]-FT FTOBGYN  02/20/2019  9:00 AM FT-FTOGBYN NURSE TECH [facility]-FT FTOBGYN  02/22/2019  8:30 AM [facility] - FTOBGYN [place]FTIMG None  02/22/2019  9:30 AM EFlorian Buff MD [facility]-FT FTOBGYN  02/26/2019  9:00 AM FT-FTOGBYN NURSE TECH [facility]-FT FTOBGYN  03/01/2019  8:30 AM [facility] - FTOBGYN [place]FTIMG  None  03/01/2019  9:30 AM BRoma Schanz CNM [facility]-FT FTOBGYN  03/05/2019  9:10 AM FT-FTOGBYN NURSE TECH [facility]-FT FTOBGYN  03/08/2019  8:30 AM [facility] - FTOBGYN [place]-FTIMG None  03/08/2019  9:30 AM CChristin Fudge CNM [facility]-FT FTOBGYN  03/12/2019  9:00 AM FT-FTOGBYN NURSE TECH [facility]-FT FTOBGYN  03/15/2019  8:30 AM [facility] - FTOBGYN [place]-FTIMG None  03/15/2019  9:30 AM CChristin Fudge CNM [facility]-FT FTOBGYN  03/19/2019  9:10 AM FT-FTOGBYN NURSE TECH [facility]-FT FTOBGYN  03/22/2019  8:30 AM [facility] - FTOBGYN [place] [facility]-FTIMG None  03/22/2019  9:30 AM Cresenzo-Dishmon, Joaquim Lai, CNM [facility]-FT FTOBGYN    Orders Placed This Encounter  Procedures  . CBC  . Comprehensive metabolic panel  . Protein / creatinine ratio, urine  . POC Urinalysis Dipstick OB   Christin Fudge CNM 02/08/2019 11:17 AM

## 2019-02-10 LAB — COMPREHENSIVE METABOLIC PANEL
ALT: 10 IU/L (ref 0–32)
AST: 11 IU/L (ref 0–40)
Albumin/Globulin Ratio: 1.1 — ABNORMAL LOW (ref 1.2–2.2)
Albumin: 3.3 g/dL — ABNORMAL LOW (ref 3.9–5.0)
Alkaline Phosphatase: 132 IU/L — ABNORMAL HIGH (ref 39–117)
BUN/Creatinine Ratio: 10 (ref 9–23)
BUN: 4 mg/dL — ABNORMAL LOW (ref 6–20)
Bilirubin Total: <0.2 mg/dL (ref 0.0–1.2)
CO2: 19 mmol/L — ABNORMAL LOW (ref 20–29)
Calcium: 9.2 mg/dL (ref 8.7–10.2)
Chloride: 104 mmol/L (ref 96–106)
Creatinine, Ser: 0.39 mg/dL — ABNORMAL LOW (ref 0.57–1.00)
GFR calc Af Amer: 169 mL/min/{1.73_m2} (ref >59)
GFR calc non Af Amer: 146 mL/min/{1.73_m2} (ref >59)
Globulin, Total: 2.9 g/dL (ref 1.5–4.5)
Glucose: 107 mg/dL — ABNORMAL HIGH (ref 65–99)
Potassium: 4 mmol/L (ref 3.5–5.2)
Sodium: 137 mmol/L (ref 134–144)
Total Protein: 6.2 g/dL (ref 6.0–8.5)

## 2019-02-10 LAB — PROTEIN / CREATININE RATIO, URINE
Creatinine, Urine: 237.8 mg/dL
Protein, Ur: 31.1 mg/dL
Protein/Creat Ratio: 131 mg/g creat (ref 0–200)

## 2019-02-10 LAB — CBC
Hematocrit: 35 % (ref 34.0–46.6)
Hemoglobin: 11.7 g/dL (ref 11.1–15.9)
MCH: 30.9 pg (ref 26.6–33.0)
MCHC: 33.4 g/dL (ref 31.5–35.7)
MCV: 92 fL (ref 79–97)
Platelets: 223 10*3/uL (ref 150–450)
RBC: 3.79 x10E6/uL (ref 3.77–5.28)
RDW: 14.2 % (ref 11.7–15.4)
WBC: 11.8 10*3/uL — ABNORMAL HIGH (ref 3.4–10.8)

## 2019-02-12 ENCOUNTER — Ambulatory Visit (INDEPENDENT_AMBULATORY_CARE_PROVIDER_SITE_OTHER): Payer: BC Managed Care – PPO | Admitting: *Deleted

## 2019-02-12 ENCOUNTER — Other Ambulatory Visit: Payer: Self-pay

## 2019-02-12 DIAGNOSIS — Z3A33 33 weeks gestation of pregnancy: Secondary | ICD-10-CM

## 2019-02-12 DIAGNOSIS — O10913 Unspecified pre-existing hypertension complicating pregnancy, third trimester: Secondary | ICD-10-CM | POA: Diagnosis not present

## 2019-02-12 DIAGNOSIS — O0990 Supervision of high risk pregnancy, unspecified, unspecified trimester: Secondary | ICD-10-CM

## 2019-02-12 DIAGNOSIS — O0993 Supervision of high risk pregnancy, unspecified, third trimester: Secondary | ICD-10-CM | POA: Diagnosis not present

## 2019-02-15 ENCOUNTER — Encounter: Payer: Self-pay | Admitting: Women's Health

## 2019-02-15 ENCOUNTER — Ambulatory Visit (INDEPENDENT_AMBULATORY_CARE_PROVIDER_SITE_OTHER): Payer: BC Managed Care – PPO | Admitting: Women's Health

## 2019-02-15 ENCOUNTER — Other Ambulatory Visit: Payer: Self-pay

## 2019-02-15 ENCOUNTER — Ambulatory Visit (INDEPENDENT_AMBULATORY_CARE_PROVIDER_SITE_OTHER): Payer: BC Managed Care – PPO

## 2019-02-15 VITALS — BP 127/87 | HR 84 | Wt 251.0 lb

## 2019-02-15 DIAGNOSIS — O10913 Unspecified pre-existing hypertension complicating pregnancy, third trimester: Secondary | ICD-10-CM

## 2019-02-15 DIAGNOSIS — O10919 Unspecified pre-existing hypertension complicating pregnancy, unspecified trimester: Secondary | ICD-10-CM | POA: Diagnosis not present

## 2019-02-15 DIAGNOSIS — O0990 Supervision of high risk pregnancy, unspecified, unspecified trimester: Secondary | ICD-10-CM

## 2019-02-15 DIAGNOSIS — O0993 Supervision of high risk pregnancy, unspecified, third trimester: Secondary | ICD-10-CM

## 2019-02-15 DIAGNOSIS — Z3A34 34 weeks gestation of pregnancy: Secondary | ICD-10-CM | POA: Diagnosis not present

## 2019-02-15 DIAGNOSIS — Z331 Pregnant state, incidental: Secondary | ICD-10-CM

## 2019-02-15 DIAGNOSIS — Z1389 Encounter for screening for other disorder: Secondary | ICD-10-CM

## 2019-02-15 DIAGNOSIS — O24419 Gestational diabetes mellitus in pregnancy, unspecified control: Secondary | ICD-10-CM

## 2019-02-15 LAB — POCT URINALYSIS DIPSTICK OB
Blood, UA: NEGATIVE
Glucose, UA: NEGATIVE
Ketones, UA: NEGATIVE
Leukocytes, UA: NEGATIVE
Nitrite, UA: NEGATIVE
POC,PROTEIN,UA: NEGATIVE

## 2019-02-15 NOTE — Progress Notes (Signed)
[place] 81+5 wks,cephalic,anterior placenta gr 3,BPP 8/8,FHR 138 BPM,AFI 16 cm,RI .59,.61,.57,.48=24%

## 2019-02-15 NOTE — Progress Notes (Signed)
HIGH-RISK PREGNANCY VISIT Patient name: [name] MRN [id number]  Date of birth: 03-25-1996 Chief Complaint:   High Risk Gestation (US today; + pressure)  History of Present Illness:   [name] is a 23 y.o. 373P2002 female at 645w1d with an Estimated Date of Delivery: 03/28/19 being seen today for ongoing management of a high-risk pregnancy complicated by [facility] currently on Labetalol 100mg  BID, A2DM on metformin 500 qPM Today she reports really bad pressure, feels like something ripping through vagina, constant since last night. Losing lots of her mucous plug. NOT checking sugars since last visit, 'just being lazy and not doing it'. Wants BTL.  Contractions: Irregular. Vag. Bleeding: None.  Movement: Present. denies leaking of fluid.  Review of Systems:   Pertinent items are noted in HPI Denies abnormal vaginal discharge w/ itching/odor/irritation, headaches, visual changes, shortness of breath, chest pain, abdominal pain, severe nausea/vomiting, or problems with urination or bowel movements unless otherwise stated above. Pertinent History Reviewed:  Reviewed past medical,surgical, social, obstetrical and family history.  Reviewed problem list, medications and allergies. Physical Assessment:   Vitals:   02/15/19 0944  BP: 127/87  Pulse: 84  Weight: 251 lb (113.9 kg)  Body mass index is 39.31 kg/m.           Physical Examination:   General appearance: alert, well appearing, and in no distress  Mental status: alert, oriented to person, place, and time  Skin: warm & dry   Extremities: Edema: None    Cardiovascular: normal heart rate noted  Respiratory: normal respiratory effort, no distress  Abdomen: gravid, soft, non-tender  Pelvic: Spec exam: cx visually floppy multiparous, SVE: outer os 2-3, inner os closed/th/-3         Fetal Status: Fetal Heart Rate (bpm): 138 u/s   Movement: Present    Fetal Surveillance Testing today:  US 34+1 wks,cephalic,anterior placenta gr 3,BPP  8/8,FHR 138 BPM,AFI 16 cm,RI .59,.61,.57,.48=24%  Results for orders placed or performed in visit on 02/15/19 (from the past 24 hour(s))  POC Urinalysis Dipstick OB   Collection Time: 02/15/19  9:30 AM  Result Value Ref Range   Color, UA     Clarity, UA     Glucose, UA Negative Negative   Bilirubin, UA     Ketones, UA neg    Spec Grav, UA     Blood, UA neg    pH, UA     POC,PROTEIN,UA Negative Negative, Trace, Small (1+), Moderate (2+), Large (3+), 4+   Urobilinogen, UA     Nitrite, UA neg    Leukocytes, UA Negative Negative   Appearance     Odor      Assessment & Plan:  1) High-risk pregnancy G3P2002 at 7045w1d with an Estimated Date of Delivery: 03/28/19   2) CHTN, stable on Labetalol 100mg  BID, ASA  3) A2DM> on metformin 500mg  qpm, noncompliant w/ checking sugars. Discussed importance of glycemic control during pregnancy as well as potential complications from uncontrolled diabetes during pregnancy. Agrees to check sugars QID as directed  4) Wants BTL, reviewed risks/benefits, discussed high incidence regret <30yo, LARCs just as effective, consent signed today   Meds: No orders of the defined types were placed in this encounter.   Labs/procedures today: spec exam, SVE, u/s  Treatment Plan:  Growth u/s @ 37-38wks    2x/wk testing nst/sono @ 32wks or weekly BPP    Deliver 38-39wks (37wks or prn if poor control)  Reviewed: Preterm labor symptoms and general obstetric precautions  including but not limited to vaginal bleeding, contractions, leaking of fluid and fetal movement were reviewed in detail with the patient.  All questions were answered.  Follow-up: Return for As scheduled tues NST w/ RN, Sign BTL consent today.  Orders Placed This Encounter  Procedures  . POC Urinalysis Dipstick OB   Roma Schanz CNM, [facility] 02/15/2019 10:34 AM

## 2019-02-15 NOTE — Patient Instructions (Addendum)
[name], I greatly value your feedback.  If you receive a survey following your visit with [place] today, we appreciate you taking the time to fill it out.  Thanks, Knute Neu, CNM, [facility]  [place]!!! It is now [place] at [facility] ([place], [place] [address]) Entrance located off of [place] parking   Go to [email or web address] to register for FREE online childbirth classes   Check blood sugars 4 times a day: in the morning before eating/drinking anything (<95) and 2 hours after your first bite of breakfast, lunch, and supper (<120).   PLEASE BRING YOUR LOG WITH YOU TO EVERY VISIT   Call the office [phone]) or go to [facility] if:  You begin to have strong, frequent contractions  Your water breaks.  Sometimes it is a big gush of fluid, sometimes it is just a trickle that keeps getting your panties wet or running down your legs  You have vaginal bleeding.  It is normal to have a small amount of spotting if your cervix was checked.   You don't feel your baby moving like normal.  If you don't, get you something to eat and drink and lay down and focus on feeling your baby move.  You should feel at least 10 movements in 2 hours.  If you don't, you should call the office or go to [place] Blood Pressure Monitoring for Patients   Your provider has recommended that you check your blood pressure (BP) at least once a week at home. If you do not have a blood pressure cuff at home, one will be provided for you. Contact your provider if you have not received your monitor within 1 week.   Helpful Tips for Accurate Home Blood Pressure Checks  . Don't smoke, exercise, or drink caffeine 30 minutes before checking your BP . Use the restroom before checking your BP (a full bladder can raise your pressure) . Relax in a comfortable upright chair . Feet on the ground . Left arm resting comfortably on a flat  surface at the level of your heart . Legs uncrossed . Back supported . Sit quietly and don't talk . Place the cuff on your bare arm . Adjust snuggly, so that only two fingertips can fit between your skin and the top of the cuff . Check 2 readings separated by at least one minute . Keep a log of your BP readings . For a visual, please reference this diagram: http://ccnc.care/bpdiagram  Provider Name: [facility]     Phone: [phone]  Zone 1: ALL CLEAR  Continue to monitor your symptoms:  . BP reading is less than 140 (top number) or less than 90 (bottom number)  . No right upper stomach pain . No headaches or seeing spots . No feeling nauseated or throwing up . No swelling in face and hands  Zone 2: CAUTION Call your doctor's office for any of the following:  . BP reading is greater than 140 (top number) or greater than 90 (bottom number)  . Stomach pain under your ribs in the middle or right side . Headaches or seeing spots . Feeling nauseated or throwing up . Swelling in face and hands  Zone 3: EMERGENCY  Seek immediate medical care if you have any of the following:  . BP reading is greater than160 (top number) or greater than 110 (bottom number) . Severe headaches not improving  with Tylenol . Serious difficulty catching your breath . Any worsening symptoms from Zone 2    Preterm Labor and Birth Information  The normal length of a pregnancy is 39-41 weeks. Preterm labor is when labor starts before 37 completed weeks of pregnancy. What are the risk factors for preterm labor? Preterm labor is more likely to occur in women who:  Have certain infections during pregnancy such as a bladder infection, sexually transmitted infection, or infection inside the uterus (chorioamnionitis).  Have a shorter-than-normal cervix.  Have gone into preterm labor before.  Have had surgery on their cervix.  Are younger than age 25 or older than age 25.  Are African American.   Are pregnant with twins or multiple babies (multiple gestation).  Take street drugs or smoke while pregnant.  Do not gain enough weight while pregnant.  Became pregnant shortly after having been pregnant. What are the symptoms of preterm labor? Symptoms of preterm labor include:  Cramps similar to those that can happen during a menstrual period. The cramps may happen with diarrhea.  Pain in the abdomen or lower back.  Regular uterine contractions that may feel like tightening of the abdomen.  A feeling of increased pressure in the pelvis.  Increased watery or bloody mucus discharge from the vagina.  Water breaking (ruptured amniotic sac). Why is it important to recognize signs of preterm labor? It is important to recognize signs of preterm labor because babies who are born prematurely may not be fully developed. This can put them at an increased risk for:  Long-term (chronic) heart and lung problems.  Difficulty immediately after birth with regulating body systems, including blood sugar, body temperature, heart rate, and breathing rate.  Bleeding in the brain.  Cerebral palsy.  Learning difficulties.  Death. These risks are highest for babies who are born before 34 weeks of pregnancy. How is preterm labor treated? Treatment depends on the length of your pregnancy, your condition, and the health of your baby. It may involve:  Having a stitch (suture) placed in your cervix to prevent your cervix from opening too early (cerclage).  Taking or being given medicines, such as: ? Hormone medicines. These may be given early in pregnancy to help support the pregnancy. ? Medicine to stop contractions. ? Medicines to help mature the baby's lungs. These may be prescribed if the risk of delivery is high. ? Medicines to prevent your baby from developing cerebral palsy. If the labor happens before 34 weeks of pregnancy, you may need to stay in the hospital. What should I do if I think  I am in preterm labor? If you think that you are going into preterm labor, call your health care provider right away. How can I prevent preterm labor in future pregnancies? To increase your chance of having a full-term pregnancy:  Do not use any tobacco products, such as cigarettes, chewing tobacco, and e-cigarettes. If you need help quitting, ask your health care provider.  Do not use street drugs or medicines that have not been prescribed to you during your pregnancy.  Talk with your health care provider before taking any herbal supplements, even if you have been taking them regularly.  Make sure you gain a healthy amount of weight during your pregnancy.  Watch for infection. If you think that you might have an infection, get it checked right away.  Make sure to tell your health care provider if you have gone into preterm labor before. This information is not intended to replace  advice given to you by your health care provider. Make sure you discuss any questions you have with your health care provider. Document Released: 08/21/2003 Document Revised: 09/22/2018 Document Reviewed: 10/22/2015 Elsevier Patient Education  2020 [employer].

## 2019-02-20 ENCOUNTER — Ambulatory Visit (INDEPENDENT_AMBULATORY_CARE_PROVIDER_SITE_OTHER): Payer: BC Managed Care – PPO | Admitting: Obstetrics and Gynecology

## 2019-02-20 ENCOUNTER — Other Ambulatory Visit: Payer: Self-pay

## 2019-02-20 DIAGNOSIS — O24419 Gestational diabetes mellitus in pregnancy, unspecified control: Secondary | ICD-10-CM

## 2019-02-20 DIAGNOSIS — O10913 Unspecified pre-existing hypertension complicating pregnancy, third trimester: Secondary | ICD-10-CM

## 2019-02-20 DIAGNOSIS — Z1389 Encounter for screening for other disorder: Secondary | ICD-10-CM

## 2019-02-20 DIAGNOSIS — O0993 Supervision of high risk pregnancy, unspecified, third trimester: Secondary | ICD-10-CM

## 2019-02-20 DIAGNOSIS — Z331 Pregnant state, incidental: Secondary | ICD-10-CM

## 2019-02-20 DIAGNOSIS — Z3A34 34 weeks gestation of pregnancy: Secondary | ICD-10-CM

## 2019-02-20 LAB — POCT URINALYSIS DIPSTICK OB
Blood, UA: NEGATIVE
Glucose, UA: NEGATIVE
Ketones, UA: NEGATIVE
Leukocytes, UA: NEGATIVE
Nitrite, UA: NEGATIVE
POC,PROTEIN,UA: NEGATIVE

## 2019-02-20 NOTE — Progress Notes (Addendum)
Patient ID: [name], female   DOB: 11/24/1995, 23 y.o.   MRN: [id number]    [facility] PREGNANCY VISIT Patient name: [name] MRN [id number]  Date of birth: 1996/04/25 Chief Complaint:   No chief complaint on file.  History of Present Illness:   [name] is a 23 y.o. G43P2002 female at [redacted]w[redacted]d with an Estimated Date of Delivery: 03/28/19 being seen today for ongoing management of a high-risk pregnancy complicated by [facility] currently on 100 mg & A2 DM metformin 500 mg Today she reports contractions since 02/18/2019.  .  .   Marland Kitchen denies leaking of fluid.  Review of Systems:   Pertinent items are noted in HPI Denies abnormal vaginal discharge w/ itching/odor/irritation, headaches, visual changes, shortness of breath, chest pain, abdominal pain, severe nausea/vomiting, or problems with urination or bowel movements unless otherwise stated above. Pertinent History Reviewed:  Reviewed past medical,surgical, social, obstetrical and family history.  Reviewed problem list, medications and allergies. Physical Assessment:  There were no vitals filed for this visit.There is no height or weight on file to calculate BMI.     LMP 09/05/2017        Physical Examination:  BP 140/84  General appearance: alert, well appearing, and in no distress  Mental status: alert, oriented to person, place, and time  Skin: warm & dry   Extremities:      Cardiovascular: normal heart rate noted  Respiratory: normal respiratory effort, no distress  Abdomen: gravid, soft, non-tender 35 cm  Pelvic: Cervical exam performed long closed, vertex        Fetal Status:        reactive  Fetal Surveillance Testing today: NST, reactive   No results found for this or any previous visit (from the past 24 hour(s)).  Assessment & Plan:  1) High-risk pregnancy G3P2002 at [redacted]w[redacted]d with an Estimated Date of Delivery: 03/28/19   2) CHTN, stable  3) A2 DM, stable  Meds: No orders of the defined types were placed in this  encounter.  Labs/procedures today: NST  Treatment Plan:  02/23/2019 NST  Reviewed: Preterm labor symptoms and general obstetric precautions including but not limited to vaginal bleeding, contractions, leaking of fluid and fetal movement were reviewed in detail with the patient.  All questions were answered.   Follow-up: No follow-ups on file.  No orders of the defined types were placed in this encounter.  By signing my name below, I, Samul Dada, attest that this documentation has been prepared under the direction and in the presence of Jonnie Kind, MD. Electronically Signed: [place]. 02/20/19. 9:49 AM.  I personally performed the services described in this documentation, which was SCRIBED in my presence. The recorded information has been reviewed and considered accurate. It has been edited as necessary during review. Jonnie Kind, MD

## 2019-02-22 ENCOUNTER — Ambulatory Visit (INDEPENDENT_AMBULATORY_CARE_PROVIDER_SITE_OTHER): Payer: BC Managed Care – PPO | Admitting: Obstetrics & Gynecology

## 2019-02-22 ENCOUNTER — Other Ambulatory Visit: Payer: Self-pay

## 2019-02-22 ENCOUNTER — Ambulatory Visit (INDEPENDENT_AMBULATORY_CARE_PROVIDER_SITE_OTHER): Payer: BC Managed Care – PPO

## 2019-02-22 ENCOUNTER — Encounter: Payer: Self-pay | Admitting: Obstetrics & Gynecology

## 2019-02-22 VITALS — BP 144/91 | HR 111 | Wt 248.0 lb

## 2019-02-22 DIAGNOSIS — O10913 Unspecified pre-existing hypertension complicating pregnancy, third trimester: Secondary | ICD-10-CM | POA: Diagnosis not present

## 2019-02-22 DIAGNOSIS — Z3A35 35 weeks gestation of pregnancy: Secondary | ICD-10-CM | POA: Diagnosis not present

## 2019-02-22 DIAGNOSIS — O0990 Supervision of high risk pregnancy, unspecified, unspecified trimester: Secondary | ICD-10-CM

## 2019-02-22 DIAGNOSIS — Z3A08 8 weeks gestation of pregnancy: Secondary | ICD-10-CM | POA: Diagnosis not present

## 2019-02-22 DIAGNOSIS — O10919 Unspecified pre-existing hypertension complicating pregnancy, unspecified trimester: Secondary | ICD-10-CM

## 2019-02-22 DIAGNOSIS — Z331 Pregnant state, incidental: Secondary | ICD-10-CM | POA: Diagnosis not present

## 2019-02-22 DIAGNOSIS — Z0283 Encounter for blood-alcohol and blood-drug test: Secondary | ICD-10-CM | POA: Diagnosis not present

## 2019-02-22 DIAGNOSIS — Z1389 Encounter for screening for other disorder: Secondary | ICD-10-CM

## 2019-02-22 DIAGNOSIS — O24419 Gestational diabetes mellitus in pregnancy, unspecified control: Secondary | ICD-10-CM

## 2019-02-22 DIAGNOSIS — O0993 Supervision of high risk pregnancy, unspecified, third trimester: Secondary | ICD-10-CM

## 2019-02-22 LAB — POCT URINALYSIS DIPSTICK OB
Blood, UA: NEGATIVE
Glucose, UA: NEGATIVE
Ketones, UA: NEGATIVE
Leukocytes, UA: NEGATIVE
Nitrite, UA: NEGATIVE

## 2019-02-22 NOTE — Progress Notes (Signed)
   HIGH-RISK PREGNANCY VISIT Patient name: [name] MRN [id number]  Date of birth: Oct 31, 1995 Chief Complaint:   High Risk Gestation (U/S)  History of Present Illness:   [name] is a 23 y.o. G64P2002 female at [redacted]w[redacted]d with an Estimated Date of Delivery: 03/28/19 being seen today for ongoing management of a high-risk pregnancy complicated by chronic hypertension, Class A2 DM Today she reports no complaints. Contractions: Not present.  .  Movement: Present. denies leaking of fluid.  Review of Systems:   Pertinent items are noted in HPI Denies abnormal vaginal discharge w/ itching/odor/irritation, headaches, visual changes, shortness of breath, chest pain, abdominal pain, severe nausea/vomiting, or problems with urination or bowel movements unless otherwise stated above. Pertinent History Reviewed:  Reviewed past medical,surgical, social, obstetrical and family history.  Reviewed problem list, medications and allergies. Physical Assessment:   Vitals:   02/22/19 0900  BP: (!) 144/91  Pulse: (!) 111  Weight: 248 lb (112.5 kg)  Body mass index is 38.84 kg/m.           Physical Examination:   General appearance: alert, well appearing, and in no distress  Mental status: alert, oriented to person, place, and time  Skin: warm & dry   Extremities: Edema: None    Cardiovascular: normal heart rate noted  Respiratory: normal respiratory effort, no distress  Abdomen: gravid, soft, non-tender  Pelvic: Cervical exam deferred         Fetal Status: Fetal Heart Rate (bpm): 137 Fundal Height: 37 cm Movement: Present    Fetal Surveillance Testing today: BPP 8/8 with Dopplers 68%   Results for orders placed or performed in visit on 02/22/19 (from the past 24 hour(s))  POC Urinalysis Dipstick OB   Collection Time: 02/22/19  9:05 AM  Result Value Ref Range   Color, UA     Clarity, UA     Glucose, UA Negative Negative   Bilirubin, UA     Ketones, UA neg    Spec Grav, UA     Blood, UA  neg    pH, UA     POC,PROTEIN,UA Moderate (2+) Negative, Trace, Small (1+), Moderate (2+), Large (3+), 4+   Urobilinogen, UA     Nitrite, UA neg    Leukocytes, UA Negative Negative   Appearance     Odor      Assessment & Plan:  1) High-risk pregnancy G3P2002 at [redacted]w[redacted]d with an Estimated Date of Delivery: 03/28/19   2) CHTN, stable, on labetalol 100 mg BID with stable BP, today however 2+ protein, get Pr/Cr   3) Class A2 DM, stable, on metformin 500 mg bedtime with good CBGs  Meds: No orders of the defined types were placed in this encounter.   Labs/procedures today: sonogram  Treatment Plan:  Twice weekly surveillance, if Pr/Cr ratio elevated will have SIPE IOL 37 weeks if not cont IOL plan 39 weeks  Reviewed: Preterm labor symptoms and general obstetric precautions including but not limited to vaginal bleeding, contractions, leaking of fluid and fetal movement were reviewed in detail with the patient.  All questions were answered. Has home bp cuff.   Follow-up: Return in about 4 days (around 02/26/2019) for NST, HROB.  Orders Placed This Encounter  Procedures  . POC Urinalysis Dipstick OB   Luther H Eure  02/22/2019 9:30 AM

## 2019-02-22 NOTE — Progress Notes (Signed)
[place] 41+7 wks,cephalic,BPP 4/0,CXKGYJEH placenta gr 3,afi 8.6 cm,RI .63,.58,.65=68%,FHR 137 BPM

## 2019-02-23 LAB — PMP SCREEN PROFILE (10S), URINE
Amphetamine Scrn, Ur: NEGATIVE ng/mL
BARBITURATE SCREEN URINE: NEGATIVE ng/mL
BENZODIAZEPINE SCREEN, URINE: NEGATIVE ng/mL
CANNABINOIDS UR QL SCN: NEGATIVE ng/mL
Cocaine (Metab) Scrn, Ur: NEGATIVE ng/mL
Creatinine(Crt), U: 297.8 mg/dL (ref 20.0–300.0)
Methadone Screen, Urine: NEGATIVE ng/mL
OXYCODONE+OXYMORPHONE UR QL SCN: NEGATIVE ng/mL
Opiate Scrn, Ur: NEGATIVE ng/mL
Ph of Urine: 6.7 (ref 4.5–8.9)
Phencyclidine Qn, Ur: NEGATIVE ng/mL
Propoxyphene Scrn, Ur: NEGATIVE ng/mL

## 2019-02-26 ENCOUNTER — Ambulatory Visit (INDEPENDENT_AMBULATORY_CARE_PROVIDER_SITE_OTHER): Payer: BC Managed Care – PPO | Admitting: *Deleted

## 2019-02-26 ENCOUNTER — Other Ambulatory Visit: Payer: Self-pay

## 2019-02-26 VITALS — BP 128/86 | HR 118 | Wt 250.0 lb

## 2019-02-26 DIAGNOSIS — O10913 Unspecified pre-existing hypertension complicating pregnancy, third trimester: Secondary | ICD-10-CM | POA: Diagnosis not present

## 2019-02-26 DIAGNOSIS — Z331 Pregnant state, incidental: Secondary | ICD-10-CM

## 2019-02-26 DIAGNOSIS — O0990 Supervision of high risk pregnancy, unspecified, unspecified trimester: Secondary | ICD-10-CM | POA: Diagnosis not present

## 2019-02-26 DIAGNOSIS — O24419 Gestational diabetes mellitus in pregnancy, unspecified control: Secondary | ICD-10-CM | POA: Diagnosis not present

## 2019-02-26 DIAGNOSIS — Z1389 Encounter for screening for other disorder: Secondary | ICD-10-CM

## 2019-02-26 DIAGNOSIS — Z3A35 35 weeks gestation of pregnancy: Secondary | ICD-10-CM | POA: Diagnosis not present

## 2019-02-26 LAB — POCT URINALYSIS DIPSTICK OB
Blood, UA: NEGATIVE
Glucose, UA: NEGATIVE
Ketones, UA: NEGATIVE
Leukocytes, UA: NEGATIVE
Nitrite, UA: NEGATIVE
POC,PROTEIN,UA: NEGATIVE

## 2019-02-27 LAB — PROTEIN / CREATININE RATIO, URINE
Creatinine, Urine: 137 mg/dL
Protein, Ur: 21.3 mg/dL
Protein/Creat Ratio: 155 mg/g creat (ref 0–200)

## 2019-02-28 ENCOUNTER — Telehealth: Payer: Self-pay | Admitting: *Deleted

## 2019-02-28 NOTE — Telephone Encounter (Signed)
Per Maudie Mercury urine normal. Called patient but was not able to leave message.

## 2019-02-28 NOTE — Telephone Encounter (Signed)
Pt wants to know results of her urine test.

## 2019-03-01 ENCOUNTER — Other Ambulatory Visit: Payer: Self-pay

## 2019-03-01 ENCOUNTER — Ambulatory Visit (INDEPENDENT_AMBULATORY_CARE_PROVIDER_SITE_OTHER): Payer: BC Managed Care – PPO | Admitting: Women's Health

## 2019-03-01 ENCOUNTER — Ambulatory Visit (INDEPENDENT_AMBULATORY_CARE_PROVIDER_SITE_OTHER): Payer: BC Managed Care – PPO

## 2019-03-01 ENCOUNTER — Encounter: Payer: Self-pay | Admitting: Women's Health

## 2019-03-01 VITALS — BP 133/93 | HR 105 | Wt 251.5 lb

## 2019-03-01 DIAGNOSIS — Z3A36 36 weeks gestation of pregnancy: Secondary | ICD-10-CM

## 2019-03-01 DIAGNOSIS — Z1389 Encounter for screening for other disorder: Secondary | ICD-10-CM

## 2019-03-01 DIAGNOSIS — O10913 Unspecified pre-existing hypertension complicating pregnancy, third trimester: Secondary | ICD-10-CM | POA: Diagnosis not present

## 2019-03-01 DIAGNOSIS — O24419 Gestational diabetes mellitus in pregnancy, unspecified control: Secondary | ICD-10-CM

## 2019-03-01 DIAGNOSIS — Z362 Encounter for other antenatal screening follow-up: Secondary | ICD-10-CM

## 2019-03-01 DIAGNOSIS — O10919 Unspecified pre-existing hypertension complicating pregnancy, unspecified trimester: Secondary | ICD-10-CM

## 2019-03-01 DIAGNOSIS — O0990 Supervision of high risk pregnancy, unspecified, unspecified trimester: Secondary | ICD-10-CM

## 2019-03-01 DIAGNOSIS — O0993 Supervision of high risk pregnancy, unspecified, third trimester: Secondary | ICD-10-CM

## 2019-03-01 DIAGNOSIS — Z331 Pregnant state, incidental: Secondary | ICD-10-CM

## 2019-03-01 LAB — POCT URINALYSIS DIPSTICK OB
Glucose, UA: NEGATIVE
Ketones, UA: NEGATIVE
Nitrite, UA: NEGATIVE
POC,PROTEIN,UA: NEGATIVE

## 2019-03-01 MED ORDER — BONJESTA 20-20 MG PO TBCR: 1.0000 | EXTENDED_RELEASE_TABLET | Freq: Every day | ORAL | 8 refills | Status: DC

## 2019-03-01 NOTE — Patient Instructions (Signed)
[name], I greatly value your feedback.  If you receive a survey following your visit with us today, we appreciate you taking the time to fill it out.  Thanks, [name], CNM, [facility]  [facility] now [facility] ([address] [place], [place] [address]) Entrance located off of [address] Free 24/7 valet parking   Go to [email or web address] to register for FREE online childbirth classes    Call the office [phone]) or go to [facility] if:  You begin to have strong, frequent contractions  Your water breaks.  Sometimes it is a big gush of fluid, sometimes it is just a trickle that keeps getting your panties wet or running down your legs  You have vaginal bleeding.  It is normal to have a small amount of spotting if your cervix was checked.   You don't feel your baby moving like normal.  If you don't, get you something to eat and drink and lay down and focus on feeling your baby move.  You should feel at least 10 movements in 2 hours.  If you don't, you should call the office or go to [facility].   Home Blood Pressure Monitoring for Patients   Your provider has recommended that you check your blood pressure (BP) at least once a week at home. If you do not have a blood pressure cuff at home, one will be provided for you. Contact your provider if you have not received your monitor within 1 week.   Helpful Tips for Accurate Home Blood Pressure Checks  . Don't smoke, exercise, or drink caffeine 30 minutes before checking your BP . Use the restroom before checking your BP (a full bladder can raise your pressure) . Relax in a comfortable upright chair . Feet on the ground . Left arm resting comfortably on a flat surface at the level of your heart . Legs uncrossed . Back supported . Sit quietly and don't talk . Place the cuff on your bare arm . Adjust snuggly, so that only two fingertips can fit between your skin and  the top of the cuff . Check 2 readings separated by at least one minute . Keep a log of your BP readings . For a visual, please reference this diagram: http://ccnc.care/bpdiagram  Provider Name: [facility]     Phone: [phone]  Zone 1: ALL CLEAR  Continue to monitor your symptoms:  . BP reading is less than 140 (top number) or less than 90 (bottom number)  . No right upper stomach pain . No headaches or seeing spots . No feeling nauseated or throwing up . No swelling in face and hands  Zone 2: CAUTION Call your doctor's office for any of the following:  . BP reading is greater than 140 (top number) or greater than 90 (bottom number)  . Stomach pain under your ribs in the middle or right side . Headaches or seeing spots . Feeling nauseated or throwing up . Swelling in face and hands  Zone 3: EMERGENCY  Seek immediate medical care if you have any of the following:  . BP reading is greater than160 (top number) or greater than 110 (bottom number) . Severe headaches not improving with Tylenol . Serious difficulty catching your breath . Any worsening symptoms from Zone 2   Call the office [phone]) or go to [facility] for these signs of pre-eclampsia:  Severe headache that does not go away  with Tylenol  Visual changes- seeing spots, double, blurred vision  Pain under your right breast or upper abdomen that does not go away with Tums or heartburn medicine  Nausea and/or vomiting  Severe swelling in your hands, feet, and face      Preterm Labor and Birth Information  The normal length of a pregnancy is 39-41 weeks. Preterm labor is when labor starts before 37 completed weeks of pregnancy. What are the risk factors for preterm labor? Preterm labor is more likely to occur in women who:  Have certain infections during pregnancy such as a bladder infection, sexually transmitted infection, or infection inside the uterus (chorioamnionitis).  Have a  shorter-than-normal cervix.  Have gone into preterm labor before.  Have had surgery on their cervix.  Are younger than age 65 or older than age 49.  Are African American.  Are pregnant with twins or multiple babies (multiple gestation).  Take street drugs or smoke while pregnant.  Do not gain enough weight while pregnant.  Became pregnant shortly after having been pregnant. What are the symptoms of preterm labor? Symptoms of preterm labor include:  Cramps similar to those that can happen during a menstrual period. The cramps may happen with diarrhea.  Pain in the abdomen or lower back.  Regular uterine contractions that may feel like tightening of the abdomen.  A feeling of increased pressure in the pelvis.  Increased watery or bloody mucus discharge from the vagina.  Water breaking (ruptured amniotic sac). Why is it important to recognize signs of preterm labor? It is important to recognize signs of preterm labor because babies who are born prematurely may not be fully developed. This can put them at an increased risk for:  Long-term (chronic) heart and lung problems.  Difficulty immediately after birth with regulating body systems, including blood sugar, body temperature, heart rate, and breathing rate.  Bleeding in the brain.  Cerebral palsy.  Learning difficulties.  Death. These risks are highest for babies who are born before 63 weeks of pregnancy. How is preterm labor treated? Treatment depends on the length of your pregnancy, your condition, and the health of your baby. It may involve:  Having a stitch (suture) placed in your cervix to prevent your cervix from opening too early (cerclage).  Taking or being given medicines, such as: ? Hormone medicines. These may be given early in pregnancy to help support the pregnancy. ? Medicine to stop contractions. ? Medicines to help mature the baby's lungs. These may be prescribed if the risk of delivery is high. ?  Medicines to prevent your baby from developing cerebral palsy. If the labor happens before 34 weeks of pregnancy, you may need to stay in the hospital. What should I do if I think I am in preterm labor? If you think that you are going into preterm labor, call your health care provider right away. How can I prevent preterm labor in future pregnancies? To increase your chance of having a full-term pregnancy:  Do not use any tobacco products, such as cigarettes, chewing tobacco, and e-cigarettes. If you need help quitting, ask your health care provider.  Do not use street drugs or medicines that have not been prescribed to you during your pregnancy.  Talk with your health care provider before taking any herbal supplements, even if you have been taking them regularly.  Make sure you gain a healthy amount of weight during your pregnancy.  Watch for infection. If you think that you might have an infection,  get it checked right away.  Make sure to tell your health care provider if you have gone into preterm labor before. This information is not intended to replace advice given to you by your health care provider. Make sure you discuss any questions you have with your health care provider. Document Released: 08/21/2003 Document Revised: 09/22/2018 Document Reviewed: 10/22/2015 Elsevier Patient Education  2020 [employer].

## 2019-03-01 NOTE — Progress Notes (Signed)
[place] 50+3 wks,cephalic,anterior placenta gr 3,normal ovaries bilat,BPP 8/8,fhr 150 bpm,afi 14.5 cm,RI .63,.68,.64,.58=77%,EFW 3135 g 78%,AC 97%

## 2019-03-01 NOTE — Progress Notes (Signed)
HIGH-RISK PREGNANCY VISIT Patient name: [name] MRN [id number]  Date of birth: 1996-04-01 Chief Complaint:   High Risk Gestation ([name] today; GBS,GC/CHL)  History of Present Illness:   [name] is a 23 y.o. G65P2002 female at [redacted]w[redacted]d with an Estimated Date of Delivery: 03/28/19 being seen today for ongoing management of a high-risk pregnancy complicated by [facility] currently on Labetalol 100mg  BID, A2DM on metformin 500mg  q PM Today she reports all sugars wnl. Denies ha, visual changes, ruq/epigastric pain, n/v.  Had 2+ proteinuria the other day, P:C ratio was normal. Nausea/vomiting all day- zofran helps when takes it, but sometimes hits randomly. Contractions: Not present. Vag. Bleeding: None.  Movement: Present. denies leaking of fluid.  Review of Systems:   Pertinent items are noted in HPI Denies abnormal vaginal discharge w/ itching/odor/irritation, headaches, visual changes, shortness of breath, chest pain, abdominal pain, severe nausea/vomiting, or problems with urination or bowel movements unless otherwise stated above. Pertinent History Reviewed:  Reviewed past medical,surgical, social, obstetrical and family history.  Reviewed problem list, medications and allergies. Physical Assessment:   Vitals:   03/01/19 0935  BP: (!) 133/93  Pulse: (!) 105  Weight: 251 lb 8 oz (114.1 kg)  Body mass index is 39.39 kg/m.           Physical Examination:   General appearance: alert, well appearing, and in no distress  Mental status: alert, oriented to person, place, and time  Skin: warm & dry   Extremities: Edema: None    Cardiovascular: normal heart rate noted  Respiratory: normal respiratory effort, no distress  Abdomen: gravid, soft, non-tender  Pelvic: Cervical exam performed  Dilation: 1.5 Effacement (%): Thick Station: -2  Fetal Status: Fetal Heart Rate (bpm): 150 u/s   Movement: Present Presentation: Vertex  Fetal Surveillance Testing today: [name] 44+8 wks,cephalic,anterior  placenta gr 3,normal ovaries bilat,BPP 8/8,fhr 150 bpm,afi 14.5 cm,RI .63,.68,.64,.58=77%,EFW 3135 g 78%,AC 97%   Results for orders placed or performed in visit on 03/01/19 (from the past 24 hour(s))  POC Urinalysis Dipstick OB   Collection Time: 03/01/19  9:36 AM  Result Value Ref Range   Color, UA     Clarity, UA     Glucose, UA Negative Negative   Bilirubin, UA     Ketones, UA neg    Spec Grav, UA     Blood, UA trace    pH, UA     POC,PROTEIN,UA Negative Negative, Trace, Small (1+), Moderate (2+), Large (3+), 4+   Urobilinogen, UA     Nitrite, UA neg    Leukocytes, UA Moderate (2+) (A) Negative   Appearance     Odor      Assessment & Plan:  1) High-risk pregnancy G3P2002 at [redacted]w[redacted]d with an Estimated Date of Delivery: 03/28/19   2) CHTN, stable, Labetalol 100mg  BID, reviewed pre-e s/s, reasons to seek care  3) A2DM, stable on metformin 500mg  PM, EFW 78% w/ AC 97%  4) N/V> rx Bonjesta to help prevent n/v, already has zofran for prn  Meds:  Meds ordered this encounter  Medications   Doxylamine-Pyridoxine ER (BONJESTA) 20-20 MG TBCR    Sig: Take 1 tablet by mouth at bedtime. Can add 1 tablet in the morning if needed for nausea and vomiting    Dispense:  60 tablet    Refill:  8    Order Specific Question:   Supervising Provider    Answer:   Tania Ade H [[id number]]    Labs/procedures today: u/s,  gbs, gc/ct, sve  Treatment Plan:  2x/wk testing, IOL @ 39wks  Reviewed: Preterm labor symptoms and general obstetric precautions including but not limited to vaginal bleeding, contractions, leaking of fluid and fetal movement were reviewed in detail with the patient.  All questions were answered.   Follow-up: Return for As scheduled mon nst with nurse.  Orders Placed This Encounter  Procedures   GC/Chlamydia Probe Amp   Strep Gp B NAA   POC Urinalysis Dipstick OB   Cheral MarkerKimberly R Yiselle Babich CNM, [facility]NP-BC 03/01/2019 10:04 AM

## 2019-03-03 LAB — STREP GP B NAA: Strep Gp B NAA: POSITIVE — AB

## 2019-03-05 ENCOUNTER — Other Ambulatory Visit: Payer: Self-pay | Admitting: Women's Health

## 2019-03-05 ENCOUNTER — Other Ambulatory Visit: Payer: BC Managed Care – PPO

## 2019-03-05 LAB — GC/CHLAMYDIA PROBE AMP
Chlamydia trachomatis, NAA: NEGATIVE
Neisseria Gonorrhoeae by PCR: NEGATIVE

## 2019-03-08 ENCOUNTER — Other Ambulatory Visit: Payer: Self-pay

## 2019-03-08 ENCOUNTER — Encounter: Payer: Self-pay | Admitting: Advanced Practice Midwife

## 2019-03-08 ENCOUNTER — Ambulatory Visit (INDEPENDENT_AMBULATORY_CARE_PROVIDER_SITE_OTHER): Payer: BC Managed Care – PPO | Admitting: Advanced Practice Midwife

## 2019-03-08 ENCOUNTER — Ambulatory Visit (INDEPENDENT_AMBULATORY_CARE_PROVIDER_SITE_OTHER): Payer: BC Managed Care – PPO

## 2019-03-08 VITALS — BP 132/86 | HR 101 | Wt 249.0 lb

## 2019-03-08 DIAGNOSIS — O0990 Supervision of high risk pregnancy, unspecified, unspecified trimester: Secondary | ICD-10-CM

## 2019-03-08 DIAGNOSIS — O10919 Unspecified pre-existing hypertension complicating pregnancy, unspecified trimester: Secondary | ICD-10-CM

## 2019-03-08 DIAGNOSIS — Z1389 Encounter for screening for other disorder: Secondary | ICD-10-CM

## 2019-03-08 DIAGNOSIS — Z3A37 37 weeks gestation of pregnancy: Secondary | ICD-10-CM

## 2019-03-08 DIAGNOSIS — O10913 Unspecified pre-existing hypertension complicating pregnancy, third trimester: Secondary | ICD-10-CM | POA: Diagnosis not present

## 2019-03-08 DIAGNOSIS — O24419 Gestational diabetes mellitus in pregnancy, unspecified control: Secondary | ICD-10-CM

## 2019-03-08 DIAGNOSIS — Z331 Pregnant state, incidental: Secondary | ICD-10-CM

## 2019-03-08 DIAGNOSIS — O0993 Supervision of high risk pregnancy, unspecified, third trimester: Secondary | ICD-10-CM

## 2019-03-08 LAB — POCT URINALYSIS DIPSTICK OB
Blood, UA: NEGATIVE
Glucose, UA: NEGATIVE
Ketones, UA: NEGATIVE
Leukocytes, UA: NEGATIVE
Nitrite, UA: NEGATIVE

## 2019-03-08 MED ORDER — BLOOD PRESSURE MONITOR AUTOMAT DEVI: 0 refills | Status: DC

## 2019-03-08 MED ORDER — TRAMADOL HCL 50 MG PO TABS: 50.0000 mg | ORAL_TABLET | Freq: Four times a day (QID) | ORAL | 0 refills | Status: DC | PRN

## 2019-03-08 MED ORDER — BONJESTA 20-20 MG PO TBCR: 1.0000 | EXTENDED_RELEASE_TABLET | Freq: Every day | ORAL | 8 refills | Status: DC

## 2019-03-08 NOTE — Addendum Note (Signed)
Addended by: Christin Fudge on: 03/08/2019 01:02 PM   Modules accepted: Orders

## 2019-03-08 NOTE — Progress Notes (Signed)
[place] 56+3 wks,cephalic,anterior placenta gr 3,normal ovaries bilat,BPP 8/8,AFI  11 cm,130 BPM,RI .66,.62,.60=79%

## 2019-03-08 NOTE — Progress Notes (Addendum)
   HIGH-RISK PREGNANCY VISIT Patient name: [name] MRN [id number]  Date of birth: 1996-03-05 Chief Complaint:   High Risk Gestation ([place] today)  History of Present Illness:   [name] is a 23 y.o. G19P2002 female at [redacted]w[redacted]d with an Estimated Date of Delivery: 03/28/19 being seen today for ongoing management of a high-risk pregnancy complicated by O1BP currently on metformin and CHTN on Labetalol.   Today she reports no complaints. Contractions: Not present. Vag. Bleeding: None.  Movement: Present. denies leaking of fluid.  Review of Systems:   Pertinent items are noted in HPI Denies abnormal vaginal discharge w/ itching/odor/irritation, headaches, visual changes, shortness of breath, chest pain, abdominal pain, severe nausea/vomiting, or problems with urination or bowel movements unless otherwise stated above. Pertinent History Reviewed:  Reviewed past medical,surgical, social, obstetrical and family history.  Reviewed problem list, medications and allergies. Physical Assessment:   Vitals:   03/08/19 0859  BP: 132/86  Pulse: (!) 101  Weight: 249 lb (112.9 kg)  Body mass index is 39 kg/m.           Physical Examination:   General appearance: alert, well appearing, and in no distress  Mental status: alert, oriented to person, place, and time  Skin: warm & dry   Extremities: Edema: None    Cardiovascular: normal heart rate noted  Respiratory: normal respiratory effort, no distress  Abdomen: gravid, soft, non-tender  Pelvic: Cervical exam deferred         Fetal Status:     Movement: Present    Fetal Surveillance Testing today: [place] 10+2 wks,cephalic,anterior placenta gr 3,normal ovaries bilat,BPP 8/8,AFI  11 cm,130 BPM,RI .66,.62,.60=79%   No results found for this or any previous visit (from the past 24 hour(s)).  Assessment & Plan:  1) High-risk pregnancy G3P2002 at [redacted]w[redacted]d with an Estimated Date of Delivery: 03/28/19   2) CHTN, stable  3) A2DM on Metformin, stable   Meds:  Meds ordered this encounter  Medications  . Blood Pressure Monitoring (BLOOD PRESSURE MONITOR AUTOMAT) DEVI    Sig: Take BP at home daily.  Alert [place] if >140/90 more than once.    Dispense:  1 Device    Refill:  0    O9.90    Order Specific Question:   Supervising Provider    Answer:   Elonda Husky, LUTHER H [[id number]]  . traMADol (ULTRAM) 50 MG tablet    Sig: Take 1 tablet (50 mg total) by mouth every 6 (six) hours as needed for severe pain (headache).    Dispense:  30 tablet    Refill:  0    Order Specific Question:   Supervising Provider    Answer:   Florian Buff [[id number]]    Labs/procedures today:   Treatment Plan:  Continue twice weekly testing (US/NST).  IOL 39 weeks  Reviewed: Term labor symptoms and general obstetric precautions including but not limited to vaginal bleeding, contractions, leaking of fluid and fetal movement were reviewed in detail with the patient.  All questions were answered   Follow-up: Return for As scheduled.      Orders Placed This Encounter  Procedures  . POC Urinalysis Dipstick OB   Christin Fudge DNP, CNM 03/08/2019 10:07 AM

## 2019-03-08 NOTE — Patient Instructions (Signed)
[name], I greatly value your feedback.  If you receive a survey following your visit with [place] today, we appreciate you taking the time to fill it out.  Thanks, Nigel Berthold, DNP, CNM  [place]!!! It is now [place] at [facility] ([place], [place] [address]) Entrance located off of [address] parking   Go to [email or web address] to register for FREE online childbirth classes    Call the office [phone]) or go to [place] if:  You begin to have strong, frequent contractions  Your water breaks.  Sometimes it is a big gush of fluid, sometimes it is just a trickle that keeps getting your panties wet or running down your legs  You have vaginal bleeding.  It is normal to have a small amount of spotting if your cervix was checked.   You don't feel your baby moving like normal.  If you don't, get you something to eat and drink and lay down and focus on feeling your baby move.  You should feel at least 10 movements in 2 hours.  If you don't, you should call the office or go to [place] Blood Pressure Monitoring for Patients   Your provider has recommended that you check your blood pressure (BP) at least once a week at home. If you do not have a blood pressure cuff at home, one will be provided for you. Contact your provider if you have not received your monitor within 1 week.   Helpful Tips for Accurate Home Blood Pressure Checks  . Don't smoke, exercise, or drink caffeine 30 minutes before checking your BP . Use the restroom before checking your BP (a full bladder can raise your pressure) . Relax in a comfortable upright chair . Feet on the ground . Left arm resting comfortably on a flat surface at the level of your heart . Legs uncrossed . Back supported . Sit quietly and don't talk . Place the cuff on your bare arm . Adjust snuggly, so that only two fingertips can fit  between your skin and the top of the cuff . Check 2 readings separated by at least one minute . Keep a log of your BP readings . For a visual, please reference this diagram: http://ccnc.care/bpdiagram  Provider Name: [facility]     Phone: [phone]  Zone 1: ALL CLEAR  Continue to monitor your symptoms:  . BP reading is less than 140 (top number) or less than 90 (bottom number)  . No right upper stomach pain . No headaches or seeing spots . No feeling nauseated or throwing up . No swelling in face and hands  Zone 2: CAUTION Call your doctor's office for any of the following:  . BP reading is greater than 140 (top number) or greater than 90 (bottom number)  . Stomach pain under your ribs in the middle or right side . Headaches or seeing spots . Feeling nauseated or throwing up . Swelling in face and hands  Zone 3: EMERGENCY  Seek immediate medical care if you have any of the following:  . BP reading is greater than160 (top number) or greater than 110 (bottom number) . Severe headaches not improving with Tylenol . Serious difficulty catching your breath . Any worsening symptoms from Zone 2

## 2019-03-12 ENCOUNTER — Other Ambulatory Visit: Payer: BC Managed Care – PPO

## 2019-03-15 ENCOUNTER — Ambulatory Visit (INDEPENDENT_AMBULATORY_CARE_PROVIDER_SITE_OTHER): Payer: BC Managed Care – PPO | Admitting: Advanced Practice Midwife

## 2019-03-15 ENCOUNTER — Ambulatory Visit (INDEPENDENT_AMBULATORY_CARE_PROVIDER_SITE_OTHER): Payer: BC Managed Care – PPO

## 2019-03-15 ENCOUNTER — Other Ambulatory Visit: Payer: Self-pay

## 2019-03-15 ENCOUNTER — Telehealth (HOSPITAL_COMMUNITY): Payer: Self-pay | Admitting: *Deleted

## 2019-03-15 VITALS — BP 114/73 | HR 87 | Wt 249.0 lb

## 2019-03-15 DIAGNOSIS — O0990 Supervision of high risk pregnancy, unspecified, unspecified trimester: Secondary | ICD-10-CM

## 2019-03-15 DIAGNOSIS — O10919 Unspecified pre-existing hypertension complicating pregnancy, unspecified trimester: Secondary | ICD-10-CM

## 2019-03-15 DIAGNOSIS — O0993 Supervision of high risk pregnancy, unspecified, third trimester: Secondary | ICD-10-CM

## 2019-03-15 DIAGNOSIS — Z1389 Encounter for screening for other disorder: Secondary | ICD-10-CM

## 2019-03-15 DIAGNOSIS — O360130 Maternal care for anti-D [Rh] antibodies, third trimester, not applicable or unspecified: Secondary | ICD-10-CM

## 2019-03-15 DIAGNOSIS — Z3A38 38 weeks gestation of pregnancy: Secondary | ICD-10-CM

## 2019-03-15 DIAGNOSIS — Z6791 Unspecified blood type, Rh negative: Secondary | ICD-10-CM

## 2019-03-15 DIAGNOSIS — O10913 Unspecified pre-existing hypertension complicating pregnancy, third trimester: Secondary | ICD-10-CM

## 2019-03-15 DIAGNOSIS — Z331 Pregnant state, incidental: Secondary | ICD-10-CM

## 2019-03-15 LAB — POCT URINALYSIS DIPSTICK OB
Blood, UA: NEGATIVE
Glucose, UA: NEGATIVE
Ketones, UA: NEGATIVE
Leukocytes, UA: NEGATIVE
Nitrite, UA: NEGATIVE

## 2019-03-15 NOTE — Progress Notes (Signed)
[place] 92+9 wks,cephalic,BPP 2/4,MQKMMN ovaries bilat,fhr 150 bpm,anterior placenta gr 3,afi 12 cm,RI .67,.62,.69,=90% w/good EDF

## 2019-03-15 NOTE — Progress Notes (Signed)
   HIGH-RISK PREGNANCY VISIT Patient name: [name] MRN [id number]  Date of birth: 1996-02-19 Chief Complaint:   High Risk Gestation (u/s)  History of Present Illness:   [name] is a 23 y.o. G72P2002 female at 110w1d with an Estimated Date of Delivery: 03/28/19 being seen today for ongoing management of a high-risk pregnancy complicated by [facility] currently on labetalol and A2DM on metformn Today she reports all BS normal range. Contractions: Not present.  .  Movement: Present. denies leaking of fluid.  Review of Systems:   Pertinent items are noted in HPI Denies abnormal vaginal discharge w/ itching/odor/irritation, headaches, visual changes, shortness of breath, chest pain, abdominal pain, severe nausea/vomiting, or problems with urination or bowel movements unless otherwise stated above. Pertinent History Reviewed:  Reviewed past medical,surgical, social, obstetrical and family history.  Reviewed problem list, medications and allergies. Physical Assessment:   Vitals:   03/15/19 0918  BP: 114/73  Pulse: 87  Weight: 249 lb (112.9 kg)  Body mass index is 39 kg/m.           Physical Examination:   General appearance: alert, well appearing, and in no distress  Mental status: alert, oriented to person, place, and time  Skin: warm & dry   Extremities: Edema: None    Cardiovascular: normal heart rate noted  Respiratory: normal respiratory effort, no distress  Abdomen: gravid, soft, non-tender  Pelvic: Cervical exam deferred         Fetal Status: Fetal Heart Rate (bpm): [place]   Movement: Present    Fetal Surveillance Testing today: [place]   Results for orders placed or performed in visit on 03/15/19 (from the past 24 hour(s))  POC Urinalysis Dipstick OB   Collection Time: 03/15/19  9:22 AM  Result Value Ref Range   Color, UA     Clarity, UA     Glucose, UA Negative Negative   Bilirubin, UA     Ketones, UA neg    Spec Grav, UA     Blood, UA neg    pH, UA     POC,PROTEIN,UA  Trace Negative, Trace, Small (1+), Moderate (2+), Large (3+), 4+   Urobilinogen, UA     Nitrite, UA neg    Leukocytes, UA Negative Negative   Appearance     Odor      Assessment & Plan:  1) High-risk pregnancy G3P2002 at [redacted]w[redacted]d with an Estimated Date of Delivery: 03/28/19   2) CHTN, stable  3) A2DM, stable  Meds: No orders of the defined types were placed in this encounter.   Labs/procedures today: none  Treatment Plan:  Twice weekly testing  IOL 10/7.  Orders in   Reviewed: Term labor symptoms and general obstetric precautions including but not limited to vaginal bleeding, contractions, leaking of fluid and fetal movement were reviewed in detail with the patient.  All questions were answered. Has a home bp cuff.  Check bp weekly, let us know if >140/90.   Follow-up:  Future Appointments  Date Time Provider [place]  03/19/2019  9:10 AM [facility] NURSE [facility]  03/21/2019  8:30 AM [facility] SCHED ROOM [facility] None    Orders Placed This Encounter  Procedures  . POC Urinalysis Dipstick OB   Christin Fudge DNP, CNM 03/15/2019 10:03 AM

## 2019-03-15 NOTE — Telephone Encounter (Signed)
Preadmission screen  

## 2019-03-19 ENCOUNTER — Ambulatory Visit (INDEPENDENT_AMBULATORY_CARE_PROVIDER_SITE_OTHER): Payer: BC Managed Care – PPO | Admitting: *Deleted

## 2019-03-19 ENCOUNTER — Encounter (HOSPITAL_COMMUNITY): Payer: Self-pay | Admitting: *Deleted

## 2019-03-19 ENCOUNTER — Other Ambulatory Visit: Payer: Self-pay

## 2019-03-19 ENCOUNTER — Inpatient Hospital Stay (HOSPITAL_COMMUNITY)
Admission: AD | Admit: 2019-03-19 | Discharge: 2019-03-21 | DRG: 797 | Disposition: A | Discharge status: Home / Self Care | Payer: BC Managed Care – PPO | Attending: Family Medicine | Admitting: Family Medicine

## 2019-03-19 ENCOUNTER — Other Ambulatory Visit: Payer: Self-pay | Admitting: Women's Health

## 2019-03-19 ENCOUNTER — Other Ambulatory Visit (HOSPITAL_COMMUNITY)
Admission: RE | Admit: 2019-03-19 | Discharge: 2019-03-19 | Disposition: A | Discharge status: Home / Self Care | Payer: BC Managed Care – PPO | Source: Ambulatory Visit | Attending: *Deleted | Admitting: *Deleted

## 2019-03-19 VITALS — BP 130/78 | HR 93 | Wt 248.0 lb

## 2019-03-19 DIAGNOSIS — Z302 Encounter for sterilization: Secondary | ICD-10-CM | POA: Diagnosis not present

## 2019-03-19 DIAGNOSIS — O24419 Gestational diabetes mellitus in pregnancy, unspecified control: Secondary | ICD-10-CM

## 2019-03-19 DIAGNOSIS — F17210 Nicotine dependence, cigarettes, uncomplicated: Secondary | ICD-10-CM | POA: Diagnosis present

## 2019-03-19 DIAGNOSIS — O99334 Smoking (tobacco) complicating childbirth: Secondary | ICD-10-CM | POA: Diagnosis present

## 2019-03-19 DIAGNOSIS — Z3689 Encounter for other specified antenatal screening: Secondary | ICD-10-CM | POA: Diagnosis not present

## 2019-03-19 DIAGNOSIS — Z20828 Contact with and (suspected) exposure to other viral communicable diseases: Secondary | ICD-10-CM | POA: Diagnosis present

## 2019-03-19 DIAGNOSIS — O1002 Pre-existing essential hypertension complicating childbirth: Secondary | ICD-10-CM | POA: Diagnosis not present

## 2019-03-19 DIAGNOSIS — O9962 Diseases of the digestive system complicating childbirth: Secondary | ICD-10-CM | POA: Diagnosis not present

## 2019-03-19 DIAGNOSIS — O10913 Unspecified pre-existing hypertension complicating pregnancy, third trimester: Secondary | ICD-10-CM

## 2019-03-19 DIAGNOSIS — O99344 Other mental disorders complicating childbirth: Secondary | ICD-10-CM | POA: Diagnosis not present

## 2019-03-19 DIAGNOSIS — O24415 Gestational diabetes mellitus in pregnancy, controlled by oral hypoglycemic drugs: Secondary | ICD-10-CM

## 2019-03-19 DIAGNOSIS — O99214 Obesity complicating childbirth: Secondary | ICD-10-CM | POA: Diagnosis not present

## 2019-03-19 DIAGNOSIS — R519 Headache, unspecified: Secondary | ICD-10-CM

## 2019-03-19 DIAGNOSIS — O26893 Other specified pregnancy related conditions, third trimester: Secondary | ICD-10-CM | POA: Diagnosis present

## 2019-03-19 DIAGNOSIS — O24429 Gestational diabetes mellitus in childbirth, unspecified control: Secondary | ICD-10-CM | POA: Diagnosis not present

## 2019-03-19 DIAGNOSIS — O24425 Gestational diabetes mellitus in childbirth, controlled by oral hypoglycemic drugs: Secondary | ICD-10-CM | POA: Diagnosis not present

## 2019-03-19 DIAGNOSIS — O10919 Unspecified pre-existing hypertension complicating pregnancy, unspecified trimester: Secondary | ICD-10-CM

## 2019-03-19 DIAGNOSIS — F4310 Post-traumatic stress disorder, unspecified: Secondary | ICD-10-CM | POA: Diagnosis present

## 2019-03-19 DIAGNOSIS — Z331 Pregnant state, incidental: Secondary | ICD-10-CM

## 2019-03-19 DIAGNOSIS — Z6791 Unspecified blood type, Rh negative: Secondary | ICD-10-CM | POA: Diagnosis not present

## 2019-03-19 DIAGNOSIS — Z3A38 38 weeks gestation of pregnancy: Secondary | ICD-10-CM | POA: Diagnosis not present

## 2019-03-19 DIAGNOSIS — K219 Gastro-esophageal reflux disease without esophagitis: Secondary | ICD-10-CM | POA: Diagnosis present

## 2019-03-19 DIAGNOSIS — F17200 Nicotine dependence, unspecified, uncomplicated: Secondary | ICD-10-CM | POA: Diagnosis present

## 2019-03-19 DIAGNOSIS — O99824 Streptococcus B carrier state complicating childbirth: Secondary | ICD-10-CM | POA: Diagnosis not present

## 2019-03-19 DIAGNOSIS — F331 Major depressive disorder, recurrent, moderate: Secondary | ICD-10-CM | POA: Diagnosis present

## 2019-03-19 DIAGNOSIS — R1013 Epigastric pain: Secondary | ICD-10-CM

## 2019-03-19 DIAGNOSIS — O219 Vomiting of pregnancy, unspecified: Secondary | ICD-10-CM

## 2019-03-19 DIAGNOSIS — O1214 Gestational proteinuria, complicating childbirth: Secondary | ICD-10-CM | POA: Diagnosis present

## 2019-03-19 DIAGNOSIS — Z1389 Encounter for screening for other disorder: Secondary | ICD-10-CM

## 2019-03-19 DIAGNOSIS — O0990 Supervision of high risk pregnancy, unspecified, unspecified trimester: Secondary | ICD-10-CM

## 2019-03-19 DIAGNOSIS — H539 Unspecified visual disturbance: Secondary | ICD-10-CM

## 2019-03-19 DIAGNOSIS — I10 Essential (primary) hypertension: Secondary | ICD-10-CM

## 2019-03-19 LAB — CBC
HCT: 32.2 % — ABNORMAL LOW (ref 36.0–46.0)
HCT: 30 % — ABNORMAL LOW (ref 36.0–46.0)
Hemoglobin: 11.1 g/dL — ABNORMAL LOW (ref 12.0–15.0)
Hemoglobin: 10.6 g/dL — ABNORMAL LOW (ref 12.0–15.0)
MCH: 31.2 pg (ref 26.0–34.0)
MCH: 31.6 pg (ref 26.0–34.0)
MCHC: 34.5 g/dL (ref 30.0–36.0)
MCHC: 35.3 g/dL (ref 30.0–36.0)
MCV: 90.4 fL (ref 80.0–100.0)
MCV: 89.6 fL (ref 80.0–100.0)
Platelets: 195 10*3/uL (ref 150–400)
Platelets: 185 10*3/uL (ref 150–400)
RBC: 3.56 MIL/uL — ABNORMAL LOW (ref 3.87–5.11)
RBC: 3.35 MIL/uL — ABNORMAL LOW (ref 3.87–5.11)
RDW: 14.6 % (ref 11.5–15.5)
RDW: 14.6 % (ref 11.5–15.5)
WBC: 9.4 10*3/uL (ref 4.0–10.5)
WBC: 10.5 10*3/uL (ref 4.0–10.5)
nRBC: 0 % (ref 0.0–0.2)
nRBC: 0 % (ref 0.0–0.2)

## 2019-03-19 LAB — COMPREHENSIVE METABOLIC PANEL
ALT: 11 U/L (ref 0–44)
AST: 18 U/L (ref 15–41)
Albumin: 2.5 g/dL — ABNORMAL LOW (ref 3.5–5.0)
Alkaline Phosphatase: 187 U/L — ABNORMAL HIGH (ref 38–126)
Anion gap: 11 (ref 5–15)
BUN: <5 mg/dL — ABNORMAL LOW (ref 6–20)
CO2: 18 mmol/L — ABNORMAL LOW (ref 22–32)
Calcium: 8.8 mg/dL — ABNORMAL LOW (ref 8.9–10.3)
Chloride: 107 mmol/L (ref 98–111)
Creatinine, Ser: 0.44 mg/dL (ref 0.44–1.00)
GFR calc Af Amer: >60 mL/min (ref >60)
GFR calc non Af Amer: >60 mL/min (ref >60)
Glucose, Bld: 131 mg/dL — ABNORMAL HIGH (ref 70–99)
Potassium: 3.5 mmol/L (ref 3.5–5.1)
Sodium: 136 mmol/L (ref 135–145)
Total Bilirubin: 0.5 mg/dL (ref 0.3–1.2)
Total Protein: 6.1 g/dL — ABNORMAL LOW (ref 6.5–8.1)

## 2019-03-19 LAB — URINALYSIS, ROUTINE W REFLEX MICROSCOPIC
Glucose, UA: NEGATIVE mg/dL
Hgb urine dipstick: NEGATIVE
Ketones, ur: 5 mg/dL — AB
Nitrite: NEGATIVE
Protein, ur: 100 mg/dL — AB
Specific Gravity, Urine: 1.032 — ABNORMAL HIGH (ref 1.005–1.030)
pH: 5 (ref 5.0–8.0)

## 2019-03-19 LAB — POCT URINALYSIS DIPSTICK OB
Blood, UA: NEGATIVE
Glucose, UA: NEGATIVE
Ketones, UA: NEGATIVE
Leukocytes, UA: NEGATIVE
Nitrite, UA: NEGATIVE

## 2019-03-19 LAB — GLUCOSE, CAPILLARY
Glucose-Capillary: 142 mg/dL — ABNORMAL HIGH (ref 70–99)
Glucose-Capillary: 79 mg/dL (ref 70–99)

## 2019-03-19 LAB — PROTEIN / CREATININE RATIO, URINE
Creatinine, Urine: 446.65 mg/dL
Protein Creatinine Ratio: 0.08 mg/mg{Cre} (ref 0.00–0.15)
Total Protein, Urine: 36 mg/dL

## 2019-03-19 LAB — TYPE AND SCREEN
ABO/RH(D): O NEG
Antibody Screen: NEGATIVE

## 2019-03-19 LAB — SARS CORONAVIRUS 2 BY RT PCR (HOSPITAL ORDER, PERFORMED IN ~~LOC~~ HOSPITAL LAB): SARS Coronavirus 2: NEGATIVE

## 2019-03-19 LAB — ABO/RH: ABO/RH(D): O NEG

## 2019-03-19 MED ORDER — OXYTOCIN BOLUS FROM INFUSION: 500.0000 mL | Freq: Once | INTRAVENOUS | Status: DC

## 2019-03-19 MED ORDER — LIDOCAINE HCL (PF) 1 % IJ SOLN: 30.0000 mL | INTRAMUSCULAR | Status: DC | PRN

## 2019-03-19 MED ORDER — OXYTOCIN 40 UNITS IN NORMAL SALINE INFUSION - SIMPLE MED
1.0000 m[IU]/min | INTRAVENOUS | Status: DC
  Administered 2019-03-19: 2 m[IU]/min via INTRAVENOUS
  Filled 2019-03-19: qty 1000

## 2019-03-19 MED ORDER — LACTATED RINGERS IV SOLN
INTRAVENOUS | Status: DC
  Administered 2019-03-19 – 2019-03-20 (×4): via INTRAVENOUS

## 2019-03-19 MED ORDER — MISOPROSTOL 50MCG HALF TABLET
50.0000 ug | ORAL_TABLET | ORAL | Status: DC
  Administered 2019-03-19: 50 ug via ORAL

## 2019-03-19 MED ORDER — PENICILLIN G 3 MILLION UNITS IVPB - SIMPLE MED
3.0000 10*6.[IU] | INTRAVENOUS | Status: DC
  Administered 2019-03-19 – 2019-03-20 (×6): 3 10*6.[IU] via INTRAVENOUS
  Filled 2019-03-19 (×6): qty 100

## 2019-03-19 MED ORDER — ZOLPIDEM TARTRATE 5 MG PO TABS
5.0000 mg | ORAL_TABLET | Freq: Once | ORAL | Status: AC
  Administered 2019-03-19: 5 mg via ORAL
  Filled 2019-03-19: qty 1

## 2019-03-19 MED ORDER — TERBUTALINE SULFATE 1 MG/ML IJ SOLN: 0.2500 mg | Freq: Once | INTRAMUSCULAR | Status: DC | PRN

## 2019-03-19 MED ORDER — CALCIUM CARBONATE ANTACID 500 MG PO CHEW
2.0000 | CHEWABLE_TABLET | ORAL | Status: DC | PRN
  Administered 2019-03-19: 400 mg via ORAL
  Filled 2019-03-19: qty 2

## 2019-03-19 MED ORDER — FLEET ENEMA 7-19 GM/118ML RE ENEM: 1.0000 | ENEMA | RECTAL | Status: DC | PRN

## 2019-03-19 MED ORDER — LABETALOL HCL 100 MG PO TABS
100.0000 mg | ORAL_TABLET | Freq: Two times a day (BID) | ORAL | Status: DC
  Filled 2019-03-19: qty 1

## 2019-03-19 MED ORDER — ACETAMINOPHEN 325 MG PO TABS: 650.0000 mg | ORAL_TABLET | ORAL | Status: DC | PRN

## 2019-03-19 MED ORDER — LACTATED RINGERS IV SOLN: 500.0000 mL | INTRAVENOUS | Status: DC | PRN

## 2019-03-19 MED ORDER — ONDANSETRON HCL 4 MG/2ML IJ SOLN: 4.0000 mg | Freq: Four times a day (QID) | INTRAMUSCULAR | Status: DC | PRN

## 2019-03-19 MED ORDER — OXYTOCIN 40 UNITS IN NORMAL SALINE INFUSION - SIMPLE MED
2.5000 [IU]/h | INTRAVENOUS | Status: DC
  Administered 2019-03-20: 200 mL via INTRAVENOUS

## 2019-03-19 MED ORDER — MISOPROSTOL 25 MCG QUARTER TABLET: 25.0000 ug | ORAL_TABLET | ORAL | Status: DC | PRN

## 2019-03-19 MED ORDER — SOD CITRATE-CITRIC ACID 500-334 MG/5ML PO SOLN: 30.0000 mL | ORAL | Status: DC | PRN

## 2019-03-19 MED ORDER — SODIUM CHLORIDE 0.9 % IV SOLN
5.0000 10*6.[IU] | Freq: Once | INTRAVENOUS | Status: AC
  Administered 2019-03-19: 14:00:00 5 10*6.[IU] via INTRAVENOUS
  Filled 2019-03-19: qty 5

## 2019-03-19 MED ORDER — HYDROXYZINE HCL 50 MG PO TABS: 50.0000 mg | ORAL_TABLET | Freq: Four times a day (QID) | ORAL | Status: DC | PRN

## 2019-03-19 MED ORDER — MISOPROSTOL 25 MCG QUARTER TABLET
ORAL_TABLET | ORAL | Status: AC
  Filled 2019-03-19: qty 1

## 2019-03-19 MED ORDER — OXYCODONE-ACETAMINOPHEN 5-325 MG PO TABS: 1.0000 | ORAL_TABLET | ORAL | Status: DC | PRN

## 2019-03-19 MED ORDER — OXYCODONE-ACETAMINOPHEN 5-325 MG PO TABS: 2.0000 | ORAL_TABLET | ORAL | Status: DC | PRN

## 2019-03-19 MED ORDER — FENTANYL CITRATE (PF) 100 MCG/2ML IJ SOLN: 50.0000 ug | INTRAMUSCULAR | Status: DC | PRN

## 2019-03-19 MED ORDER — MISOPROSTOL 25 MCG QUARTER TABLET
25.0000 ug | ORAL_TABLET | Freq: Once | ORAL | Status: AC
  Administered 2019-03-19: 25 ug via VAGINAL

## 2019-03-19 MED ORDER — MISOPROSTOL 50MCG HALF TABLET
ORAL_TABLET | ORAL | Status: AC
  Filled 2019-03-19: qty 1

## 2019-03-19 NOTE — Progress Notes (Signed)
LABOR PROGRESS NOTE  [name] is a 23 y.o. G3P2002 at [redacted]w[redacted]d  admitted for new onset proteinuria with concerns for preeclampsia.  Subjective: Patient notes shes starting to feel more contractions. Denies any concerns or complaints. Denies any headaches/vision changes.  Objective: BP 132/78   Pulse 77   Temp 98.2 F (36.8 C) (Oral)   Resp 18   Ht 5\' 7"  (1.702 m)   Wt 112.7 kg   LMP 09/05/2017   SpO2 100%   BMI 38.90 kg/m  or  Vitals:   03/19/19 1923 03/19/19 1946 03/19/19 2027 03/19/19 2101  BP: 121/77 132/72 125/73 132/78  Pulse: 78 98 78 77  Resp: 18 20 18 18   Temp:      TempSrc:      SpO2:      Weight:      Height:        Dilation: 5 Effacement (%): 70, 80 Cervical Position: Anterior Station: -2 Presentation: Vertex Exam by:: Dr. Shawna Orleans FHT: baseline rate 150, moderate varibility, + acel, no decel Toco: intermittent  Labs: Lab Results  Component Value Date   WBC 10.5 03/19/2019   HGB 10.6 (L) 03/19/2019   HCT 30.0 (L) 03/19/2019   MCV 89.6 03/19/2019   PLT 185 03/19/2019    Patient Active Problem List   Diagnosis Date Noted  . GDM, class A2 03/19/2019  . Gestational diabetes mellitus, class A2 12/25/2018  . Supervision of high risk pregnancy, antepartum 08/17/2018  . PTSD (post-traumatic stress disorder) 03/30/2018  . MDD (major depressive disorder), recurrent episode, moderate ([place]) 03/30/2018  . Multiple acquired skin tags 02/18/2017  . Chronic hypertension in pregnancy 01/24/2017  . Abnormal EKG 01/02/2017  . Tachycardia 01/02/2017  . Abnormal Pap smear of cervix 07/26/2016  . Rh negative state in antepartum period 07/20/2016  . Smoker 05/07/2013    Assessment / Plan: 23 y.o. G3P2002 at [redacted]w[redacted]d here for new onset proteinuria with concerns for preeclampsia.  Labor: Continue Pitocin. Plan for cervical check when patient begins to feel more contractions/pressure. Fetal Wellbeing:  Cat I Pain Control:  Epidural upon request Anticipated  MOD:  NSVD  Mina Marble, D.O. [place], PGY2 03/19/2019, 9:08 PM

## 2019-03-19 NOTE — MAU Provider Note (Signed)
History     CSN: [id number]  Arrival date and time: 03/19/19 1127   First Provider Initiated Contact with Patient 03/19/19 1232      Chief Complaint  Patient presents with  . BP Evaluation   [name] is a 23 y.o. G3P2002 at 29w5dwho presents to MAU for preeclampsia evaluation after she was seen in the office today and found to have new onset proteinuria and sent to MAU for STAT labs. When the office called, they stated this was the first time she had protein in her urine in the office, but pt states she has had protein in her urine in office prior to today, on multiple occasions.  Pt reports HA today that she rates as 6/10. Pt reports she has experienced multiple HAs with this pregnancy, and was in MAU on 12/19/2018 with a HA that she could not get to go away at home. Pt reports she has not tried taking anything for her HA today. Pt reports she usually takes Tramadol to get her HA to go away at home.  Pt also reports she "always" has floaters, since her HA started in July. Pt reports last night she was seeing "lights" in her vision, but denies this symptoms today.  Pt reports she has struggled with nausea and vomiting her entire pregnancy, but reports the nausea worsened over the past two days, but the amount of times she vomited stayed the same.  Pt reports sharp, epigastric pain that started about 2days ago.  Pt reports she feels like her face is swollen for the past week, despite reported weight loss.  Pt denies blurry vision, N/V, swelling in hands, sudden weight gain. Pt denies chest pain and SOB.  Pt denies constipation, diarrhea, or urinary problems. Pt denies fever, chills, fatigue, sweating or changes in appetite. Pt denies dizziness, light-headedness, weakness.  Pt denies VB, ctx, LOF and reports good FM.  Current pregnancy problems? GDM - Metformin, cHTN - labetlol Blood Type? O NEGATIVE Allergies? NKDA Current medications? Metformin, labetalol,  cymbalta Current PNC & next appt? [facility], IOL Wednesday 03/21/2019   OB History    Gravida  3   Para  2   Term  2   Preterm      AB      Living  2     SAB      TAB      Ectopic      Multiple  0   Live Births  2           Past Medical History:  Diagnosis Date  . Anxiety   . Asthma    childhood  . Depression    post partum  . Dyspnea   . Headache   . Hypertension   . Irregular menstrual bleeding 01/31/2014  . PTSD (post-traumatic stress disorder)    pTSD  . Recurrent upper respiratory infection (URI)    Bronchitis  . Vaginal Pap smear, abnormal    HPV    Past Surgical History:  Procedure Laterality Date  . CHOLECYSTECTOMY    . CYST REMOVAL NECK    . TYMPANOSTOMY TUBE PLACEMENT    . TYMPANOSTOMY TUBE PLACEMENT      Family History  Problem Relation Age of Onset  . Bipolar disorder Maternal Grandmother   . Heart disease Maternal Grandfather   . Thyroid disease Paternal Grandmother   . Arthritis Mother   . Fibromyalgia Mother   . Diabetes Mother   . Cancer Mother  vaginal  . Birth defects Father     Social History   Tobacco Use  . Smoking status: Current Every Day Smoker    Packs/day: 1.00    Years: 9.00    Pack years: 9.00    Types: Cigarettes  . Smokeless tobacco: Never Used  Substance Use Topics  . Alcohol use: Not Currently    Comment: socially  . Drug use: No    Types: Marijuana    Comment: denies use 06/20/14    Allergies: No Known Allergies  Medications Prior to Admission  Medication Sig Dispense Refill Last Dose  . cyclobenzaprine (FLEXERIL) 10 MG tablet Take 1 tablet (10 mg total) by mouth every 8 (eight) hours as needed for muscle spasms. 30 tablet 1 Past Week at Unknown time  . DULoxetine (CYMBALTA) 60 MG capsule Take 1 capsule (60 mg total) by mouth daily. 90 capsule 0 03/18/2019 at 2000  . labetalol (NORMODYNE) 100 MG tablet Take 1 tablet (100 mg total) by mouth 2 (two) times daily. 60 tablet 3 03/19/2019 at  0800  . metFORMIN (GLUCOPHAGE) 500 MG tablet Take 1 tablet (500 mg total) by mouth at bedtime. 30 tablet 1 03/18/2019 at 2000  . ondansetron (ZOFRAN ODT) 8 MG disintegrating tablet Take 1 tablet (8 mg total) by mouth every 8 (eight) hours as needed for nausea or vomiting. 20 tablet 2 Past Week at Unknown time  . traMADol (ULTRAM) 50 MG tablet Take 1 tablet (50 mg total) by mouth every 6 (six) hours as needed for severe pain (headache). 30 tablet 0 Past Week at Unknown time  . ACCU-CHEK GUIDE test strip Use as instructed 100 each 12   . aspirin EC 81 MG tablet Take 2 tablets (162 mg total) by mouth daily. 60 tablet 6   . Blood Glucose Monitoring Suppl (ACCU-CHEK GUIDE ME) w/Device KIT 1 each by Does not apply route 4 (four) times daily. Needs supplies to take BS QID (has device already) 1 kit prn   . Blood Pressure Monitoring (BLOOD PRESSURE MONITOR AUTOMAT) DEVI Take BP at home daily.  Alert [place] if >140/90 more than once. 1 Device 0   . Doxylamine-Pyridoxine ER (BONJESTA) 20-20 MG TBCR Take 1 tablet by mouth at bedtime. Can add 1 tablet in the morning if needed for nausea and vomiting (Patient not taking: Reported on 03/15/2019) 60 tablet 8   . prenatal vitamin w/FE, FA (PRENATAL 1 + 1) 27-1 MG TABS tablet Take 1 tablet by mouth daily at 12 noon. 30 each 12     Review of Systems  Constitutional: Negative for chills, diaphoresis, fatigue and fever.  Eyes: Positive for visual disturbance.  Respiratory: Negative for shortness of breath.   Cardiovascular: Negative for chest pain.  Gastrointestinal: Positive for abdominal pain, nausea and vomiting. Negative for constipation and diarrhea.  Genitourinary: Negative for dysuria, flank pain, frequency, pelvic pain, urgency, vaginal bleeding and vaginal discharge.  Musculoskeletal:       Swollen face  Neurological: Positive for headaches. Negative for dizziness, weakness and light-headedness.   Physical Exam   Blood pressure 133/75, pulse 96, temperature  98.1 F (36.7 C), temperature source Oral, resp. rate 18, height 5' 7" (1.702 m), weight 112.7 kg, last menstrual period 09/05/2017, SpO2 100 %.  Patient Vitals for the past 24 hrs:  BP Temp Temp src Pulse Resp SpO2 Height Weight  03/19/19 1338 133/75 - - 96 18 - - -  03/19/19 1245 130/76 - - 99 - - - -  03/19/19 1230  130/78 - - (!) 102 - - - -  03/19/19 1215 132/76 - - (!) 114 - - - -  03/19/19 1205 136/80 - - (!) 110 - - - -  03/19/19 1148 (!) 141/85 98.1 F (36.7 C) Oral (!) 117 18 100 % - -  03/19/19 1136 - - - - - - 5' 7" (1.702 m) 112.7 kg   Physical Exam  Constitutional: She is oriented to person, place, and time. She appears well-developed and well-nourished. No distress.  HENT:  Head: Normocephalic and atraumatic.  Respiratory: Effort normal.  GI: Soft. She exhibits no distension and no mass. There is abdominal tenderness (mid-line, epigastric, mild). There is no rebound and no guarding.  Neurological: She is alert and oriented to person, place, and time.  Skin: Skin is warm and dry. She is not diaphoretic.  Psychiatric: She has a normal mood and affect. Her behavior is normal. Judgment and thought content normal.   Results for orders placed or performed during the hospital encounter of 03/19/19 (from the past 24 hour(s))  CBC     Status: Abnormal   Collection Time: 03/19/19 11:53 AM  Result Value Ref Range   WBC 9.4 4.0 - 10.5 K/uL   RBC 3.56 (L) 3.87 - 5.11 MIL/uL   Hemoglobin 11.1 (L) 12.0 - 15.0 g/dL   HCT 32.2 (L) 36.0 - 46.0 %   MCV 90.4 80.0 - 100.0 fL   MCH 31.2 26.0 - 34.0 pg   MCHC 34.5 30.0 - 36.0 g/dL   RDW 14.6 11.5 - 15.5 %   Platelets 195 150 - 400 K/uL   nRBC 0.0 0.0 - 0.2 %  Comprehensive metabolic panel     Status: Abnormal   Collection Time: 03/19/19 11:53 AM  Result Value Ref Range   Sodium 136 135 - 145 mmol/L   Potassium 3.5 3.5 - 5.1 mmol/L   Chloride 107 98 - 111 mmol/L   CO2 18 (L) 22 - 32 mmol/L   Glucose, Bld 131 (H) 70 - 99 mg/dL    BUN <5 (L) 6 - 20 mg/dL   Creatinine, Ser 0.44 0.44 - 1.00 mg/dL   Calcium 8.8 (L) 8.9 - 10.3 mg/dL   Total Protein 6.1 (L) 6.5 - 8.1 g/dL   Albumin 2.5 (L) 3.5 - 5.0 g/dL   AST 18 15 - 41 U/L   ALT 11 0 - 44 U/L   Alkaline Phosphatase 187 (H) 38 - 126 U/L   Total Bilirubin 0.5 0.3 - 1.2 mg/dL   GFR calc non Af Amer >60 >60 mL/min   GFR calc Af Amer >60 >60 mL/min   Anion gap 11 5 - 15  Protein / creatinine ratio, urine     Status: None   Collection Time: 03/19/19 12:12 PM  Result Value Ref Range   Creatinine, Urine 446.65 mg/dL   Total Protein, Urine 36 mg/dL   Protein Creatinine Ratio 0.08 0.00 - 0.15 mg/mg[Cre]  Urinalysis, Routine w reflex microscopic     Status: Abnormal   Collection Time: 03/19/19 12:12 PM  Result Value Ref Range   Color, Urine AMBER (A) YELLOW   APPearance CLOUDY (A) CLEAR   Specific Gravity, Urine 1.032 (H) 1.005 - 1.030   pH 5.0 5.0 - 8.0   Glucose, UA NEGATIVE NEGATIVE mg/dL   Hgb urine dipstick NEGATIVE NEGATIVE   Bilirubin Urine MODERATE (A) NEGATIVE   Ketones, ur 5 (A) NEGATIVE mg/dL   Protein, ur 100 (A) NEGATIVE mg/dL   Nitrite NEGATIVE  NEGATIVE   Leukocytes,Ua SMALL (A) NEGATIVE   RBC / HPF 0-5 0 - 5 RBC/hpf   WBC, UA 21-50 0 - 5 WBC/hpf   Bacteria, UA MANY (A) NONE SEEN   Squamous Epithelial / LPF 21-50 0 - 5   Mucus PRESENT    US Ob Follow Up  Result Date: 03/07/2019 FOLLOW UP SONOGRAM CANDELARIA PIES is in the office for a follow up sonogram for EFW. She is a 23 y.o. year old G18P2002 with Estimated Date of Delivery: 03/28/19 by early ultrasound now at  16w1dweeks gestation. Thus far the pregnancy has been complicated by CHTN,smoker,GDM A2. GESTATION: SINGLETON PRESENTATION: cephalic FETAL ACTIVITY:          Heart rate         150          The fetus is active. AMNIOTIC FLUID: The amniotic fluid volume is  normal, 14 cm. PLACENTA LOCALIZATION:  anterior GRADE 3 CERVIX: Limited view ADNEXA: The ovaries are normal. GESTATIONAL AGE AND   BIOMETRICS: Gestational criteria: Estimated Date of Delivery: 03/28/19 by early ultrasound now at 322w1drevious Scans:9          BIPARIETAL DIAMETER           8.45 cm         34 weeks HEAD CIRCUMFERENCE           33.01 cm         37+4 weeks ABDOMINAL CIRCUMFERENCE           34.57 cm         38+3 weeks   97% FEMUR LENGTH           6.94 cm         35+4 weeks                                                       AVERAGE EGA(BY THIS SCAN):  36+2 weeks                                                 ESTIMATED FETAL WEIGHT:       3135  grams, 78 % ANATOMICAL SURVEY                                                                            COMMENTS CEREBRAL VENTRICLES yes normal  CHOROID PLEXUS yes normal  CEREBELLUM yes normal  CISTERNA MAGNA yes normal          NASAL BONE yes normal  NOSE/LIP yes normal  FACIAL PROFILE yes normal  4 CHAMBERED HEART yes normal  OUTFLOW TRACTS yes normal  DIAPHRAGM yes normal  STOMACH yes normal  RENAL REGION yes normal  BLADDER yes normal      3 VESSEL CORD yes normal              GENITALIA   female  SUSPECTED ABNORMALITIES:  no QUALITY OF SCAN: satisfactory TECHNICIAN COMMENTS: [place] 81+8 wks,cephalic,anterior placenta gr 3,normal ovaries bilat,BPP 8/8,fhr 150 bpm,afi 14.5 cm,RI .63,.68,.64,.58=77%,EFW 3135 g 78%,AC 97% A copy of this report including all images has been saved and backed up to a second source for retrieval if needed. All measures and details of the anatomical scan, placentation, fluid volume and pelvic anatomy are contained in that report. Amber Heide Guile 03/01/2019 9:15 AM Clinical Impression and recommendations: I have reviewed the sonogram results above, combined with the patient's current clinical course, below are my impressions and any appropriate recommendations for management based on the sonographic findings. 1.  [id number] Estimated Date of Delivery: 03/28/19 by serial sonographic evaluations 2.  Fetal sonographic surveillance findings: a). Normal fluid volume b).  Normal antepartum fetal assessment with BPP 8/8 c). Normal fetal Doppler ratios with consistent diastolic flow 02% d). Normal growth percentile with appropriate interval growth 78% 3.  Normal general sonographic findings Recommend continued prenatal evaluations and care based on this sonogram and as clinically indicated from the patient's clinical course. Florian Buff 03/07/2019 8:32 AM   US Fetal Bpp Wo Non Stress  Result Date: 03/15/2019 FOLLOW UP SONOGRAM GLENETTE BOOKWALTER is in the office for a follow up sonogram for BPP and cord dopplers. She is a 23 y.o. year old G72P2002 with Estimated Date of Delivery: 03/28/19 by early ultrasound now at  50w1dweeks gestation. Thus far the pregnancy has been complicated by CHTN,GDM AO3,ZCHYIF GESTATION: SINGLETON PRESENTATION: cephalic FETAL ACTIVITY:          Heart rate         150          The fetus is active. AMNIOTIC FLUID: The amniotic fluid volume is  normal, 12 cm. PLACENTA LOCALIZATION:  anterior GRADE 3 CERVIX: Limited view ADNEXA: The ovaries are normal. GESTATIONAL AGE AND  BIOMETRICS: Gestational criteria: Estimated Date of Delivery: 03/28/19 by early ultrasound now at 331w1drevious Scans:11 BIOPHYSICAL PROFILE:                                                                                                      COMMENTS GROSS BODY MOVEMENT                 2  TONE                2  RESPIRATIONS                2  AMNIOTIC FLUID                2                                                          SCORE:  8/8 (Note: NST was not performed as part of this antepartum testing) DOPPLER FLOW STUDIES: UMBILICAL ARTERY RI RATIOS:   .67,.62,.69,=90% w/good EDF  ANATOMICAL SURVEY                                                                            COMMENTS CEREBRAL VENTRICLES yes normal  CHOROID PLEXUS yes normal  CEREBELLUM yes normal  CISTERNA MAGNA yes normal                  FACIAL PROFILE yes normal          DIAPHRAGM yes normal  STOMACH yes normal  RENAL  REGION yes normal  BLADDER yes normal      3 VESSEL CORD yes normal              GENITALIA   female     SUSPECTED ABNORMALITIES:  RI 90% with good EDF QUALITY OF SCAN: satisfactory TECHNICIAN COMMENTS: [place] 40+8 wks,cephalic,BPP 1/4,GYJEHU ovaries bilat,fhr 150 bpm,anterior placenta gr 3,afi 12 cm,RI .67,.62,.69,=90% w/good EDF A copy of this report including all images has been saved and backed up to a second source for retrieval if needed. All measures and details of the anatomical scan, placentation, fluid volume and pelvic anatomy are contained in that report. Amber Heide Guile 03/15/2019 9:07 AM Clinical Impression and recommendations: I have reviewed the sonogram results above, combined with the patient's current clinical course, below are my impressions and any appropriate recommendations for management based on the sonographic findings. 1.  [id number] Estimated Date of Delivery: 03/28/19 by serial sonographic evaluations 2.  Fetal sonographic surveillance findings: a). Normal fluid volume b). Normal antepartum fetal assessment with BPP 8/8 c). Borderline, but stable, elevated fetal Doppler ratios with consistent diastolic flow 3.  Normal general sonographic findings Recommend continued prenatal evaluations and care based on this sonogram and as clinically indicated from the patient's clinical course. Mertie Clause Eure 03/15/2019 10:48 AM   US Fetal Bpp Wo Non Stress  Result Date: 03/08/2019 FOLLOW UP SONOGRAM MEIKO IVES is in the office for a follow up sonogram for BPP and cord dopplers. She is a 23 y.o. year old G20P2002 with Estimated Date of Delivery: 03/28/19 by early ultrasound now at  33w1dweeks gestation. Thus far the pregnancy has been complicated by CLibertyPRESENTATION:cephalic FETAL ACTIVITY:          Heart rate         130          The fetus is active. AMNIOTIC FLUID: The amniotic fluid volume is  normal, 11 cm. PLACENTA LOCALIZATION:  anterior GRADE 3 CERVIX: Limited view ADNEXA:  The ovaries are normal. GESTATIONAL AGE AND  BIOMETRICS: Gestational criteria: Estimated Date of Delivery: 03/28/19 by early ultrasound now at 370w1drevious Scans:10 BIOPHYSICAL PROFILE:  COMMENTS GROSS BODY MOVEMENT                 2  TONE                2  RESPIRATIONS                2  AMNIOTIC FLUID                2                                                          SCORE:  8/8 (Note: NST was not performed as part of this antepartum testing) DOPPLER FLOW STUDIES: UMBILICAL ARTERY RI RATIOS:  .66,.62,.60=79% ANATOMICAL SURVEY                                                                            COMMENTS CEREBRAL VENTRICLES yes normal  CHOROID PLEXUS yes normal  CEREBELLUM yes normal  CISTERNA MAGNA yes normal                  FACIAL PROFILE yes normal  4 CHAMBERED HEART yes normal  OUTFLOW TRACTS yes normal  DIAPHRAGM yes normal  STOMACH yes normal  RENAL REGION yes normal  BLADDER yes normal      3 VESSEL CORD yes normal              GENITALIA   female     SUSPECTED ABNORMALITIES:  no QUALITY OF SCAN: satisfactory TECHNICIAN COMMENTS: [place] 19+6 wks,cephalic,anterior placenta gr 3,normal ovaries bilat,BPP 8/8,AFI  11 cm,130 BPM,RI .66,.62,.60=79% A copy of this report including all images has been saved and backed up to a second source for retrieval if needed. All measures and details of the anatomical scan, placentation, fluid volume and pelvic anatomy are contained in that report. Amber Heide Guile 03/08/2019 9:01 AM Clinical Impression and recommendations: I have reviewed the sonogram results above, combined with the patient's current clinical course, below are my impressions and any appropriate recommendations for management based on the sonographic findings. 1.  [id number] Estimated Date of Delivery: 03/28/19 by  LMP and confirmed by today's sonographic dating 2. specifically reassuring fetal assessment with  good BPP, fluid and excellent Doppler flow 3.   Normal fetal sonographic findings, specifically normal detailed anatomical evaluation,      no abnormalities noted Recommend routine prenatal care based on this sonogram or as clinically indicated Jonnie Kind 03/08/2019 4:55 PM   US Fetal Bpp Wo Non Stress  Result Date: 03/07/2019 FOLLOW UP SONOGRAM SHARNEE DOUGLASS is in the office for a follow up sonogram for EFW. She is a 23 y.o. year old G25P2002 with Estimated Date of Delivery: 03/28/19 by early ultrasound now at  74w1dweeks gestation. Thus far the pregnancy has been complicated by CHTN,smoker,GDM A2. GESTATION: SINGLETON PRESENTATION: cephalic FETAL ACTIVITY:          Heart rate         150  The fetus is active. AMNIOTIC FLUID: The amniotic fluid volume is  normal, 14 cm. PLACENTA LOCALIZATION:  anterior GRADE 3 CERVIX: Limited view ADNEXA: The ovaries are normal. GESTATIONAL AGE AND  BIOMETRICS: Gestational criteria: Estimated Date of Delivery: 03/28/19 by early ultrasound now at 39w1dPrevious Scans:9          BIPARIETAL DIAMETER           8.45 cm         34 weeks HEAD CIRCUMFERENCE           33.01 cm         37+4 weeks ABDOMINAL CIRCUMFERENCE           34.57 cm         38+3 weeks   97% FEMUR LENGTH           6.94 cm         35+4 weeks                                                       AVERAGE EGA(BY THIS SCAN):  36+2 weeks                                                 ESTIMATED FETAL WEIGHT:       3135  grams, 78 % ANATOMICAL SURVEY                                                                            COMMENTS CEREBRAL VENTRICLES yes normal  CHOROID PLEXUS yes normal  CEREBELLUM yes normal  CISTERNA MAGNA yes normal          NASAL BONE yes normal  NOSE/LIP yes normal  FACIAL PROFILE yes normal  4 CHAMBERED HEART yes normal  OUTFLOW TRACTS yes normal  DIAPHRAGM yes normal  STOMACH yes normal  RENAL REGION yes normal  BLADDER yes normal      3 VESSEL CORD yes normal              GENITALIA    female     SUSPECTED ABNORMALITIES:  no QUALITY OF SCAN: satisfactory TECHNICIAN COMMENTS: [place]378+2wks,cephalic,anterior placenta gr 3,normal ovaries bilat,BPP 8/8,fhr 150 bpm,afi 14.5 cm,RI .63,.68,.64,.58=77%,EFW 3135 g 78%,AC 97% A copy of this report including all images has been saved and backed up to a second source for retrieval if needed. All measures and details of the anatomical scan, placentation, fluid volume and pelvic anatomy are contained in that report. Amber JHeide Guile9/17/2020 9:15 AM Clinical Impression and recommendations: I have reviewed the sonogram results above, combined with the patient's current clinical course, below are my impressions and any appropriate recommendations for management based on the sonographic findings. 1.  [id number] Date of Delivery: 03/28/19 by serial sonographic evaluations 2.  Fetal sonographic surveillance findings: a). Normal fluid volume b). Normal antepartum fetal assessment with BPP 8/8 c). Normal fetal Doppler  ratios with consistent diastolic flow 97% d). Normal growth percentile with appropriate interval growth 78% 3.  Normal general sonographic findings Recommend continued prenatal evaluations and care based on this sonogram and as clinically indicated from the patient's clinical course. Florian Buff 03/07/2019 8:32 AM   US Fetal Bpp Wo Non Stress  Result Date: 02/22/2019 FOLLOW UP SONOGRAM ACCALIA RIGDON is in the office for a follow up sonogram for BPP and cord dopplers. She is a 23 y.o. year old G19P2002 with Estimated Date of Delivery: 03/28/19 by early ultrasound now at  64w1dweeks gestation. Thus far the pregnancy has been complicated by CHTN,GDM A2. GESTATION: SINGLETON PRESENTATION: cephalic FETAL ACTIVITY:          Heart rate         137          The fetus is active. AMNIOTIC FLUID: The amniotic fluid volume is  normal, 8.6 cm. PLACENTA LOCALIZATION:  anterior GRADE 3 CERVIX: Limited view ADNEXA: The ovaries are normal. GESTATIONAL AGE AND   BIOMETRICS: Gestational criteria: Estimated Date of Delivery: 03/28/19 by early ultrasound now at 373w1drevious Scans:8 BIOPHYSICAL PROFILE:                                                                                                      COMMENTS GROSS BODY MOVEMENT                 2  TONE                2  RESPIRATIONS                2  AMNIOTIC FLUID                2                                                          SCORE:  8/8 (Note: NST was not performed as part of this antepartum testing) DOPPLER FLOW STUDIES: UMBILICAL ARTERY RI RATIOS:   .63,.58,.65=68% ANATOMICAL SURVEY                                                                            COMMENTS CEREBRAL VENTRICLES yes normal  CHOROID PLEXUS yes normal  CEREBELLUM yes normal  CISTERNA MAGNA yes normal          NASAL BONE yes normal  NOSE/LIP yes normal  FACIAL PROFILE yes normal  4 CHAMBERED HEART yes normal  OUTFLOW TRACTS yes normal  DIAPHRAGM yes normal  STOMACH yes normal  RENAL REGION yes normal  BLADDER  yes normal      3 VESSEL CORD yes normal              GENITALIA yes normal female     SUSPECTED ABNORMALITIES:  no QUALITY OF SCAN: satisfactory TECHNICIAN COMMENTS: [place] 95+6 wks,cephalic,BPP 2/1,HYQMVHQI placenta gr 3,afi 8.6 cm,RI .63,.58,.65=68%,FHR 137 BPM A copy of this report including all images has been saved and backed up to a second source for retrieval if needed. All measures and details of the anatomical scan, placentation, fluid volume and pelvic anatomy are contained in that report. Amber Heide Guile 02/22/2019 9:14 AM Clinical Impression and recommendations: I have reviewed the sonogram results above, combined with the patient's current clinical course, below are my impressions and any appropriate recommendations for management based on the sonographic findings. 1.  [id number] Estimated Date of Delivery: 03/28/19 by serial sonographic evaluations 2.  Fetal sonographic surveillance findings: a). Normal fluid volume b). Normal  antepartum fetal assessment with BPP 8/8 c). Normal fetal Doppler ratios with consistent diastolic flow 84% 3.  Normal general sonographic findings Recommend continued prenatal evaluations and care based on this sonogram and as clinically indicated from the patient's clinical course. Florian Buff 02/22/2019 9:27 AM   [place] Ua Cord Doppler  Result Date: 03/15/2019 FOLLOW UP SONOGRAM ZOHAL RENY is in the office for a follow up sonogram for BPP and cord dopplers. She is a 23 y.o. year old G48P2002 with Estimated Date of Delivery: 03/28/19 by early ultrasound now at  72w1dweeks gestation. Thus far the pregnancy has been complicated by CHTN,GDM AX3,KGMWNU GESTATION: SINGLETON PRESENTATION: cephalic FETAL ACTIVITY:          Heart rate         150          The fetus is active. AMNIOTIC FLUID: The amniotic fluid volume is  normal, 12 cm. PLACENTA LOCALIZATION:  anterior GRADE 3 CERVIX: Limited view ADNEXA: The ovaries are normal. GESTATIONAL AGE AND  BIOMETRICS: Gestational criteria: Estimated Date of Delivery: 03/28/19 by early ultrasound now at 3106w1drevious Scans:11 BIOPHYSICAL PROFILE:                                                                                                      COMMENTS GROSS BODY MOVEMENT                 2  TONE                2  RESPIRATIONS                2  AMNIOTIC FLUID                2                                                          SCORE:  8/8 (Note: NST was not performed as part  of this antepartum testing) DOPPLER FLOW STUDIES: UMBILICAL ARTERY RI RATIOS:   .67,.62,.69,=90% w/good EDF ANATOMICAL SURVEY                                                                            COMMENTS CEREBRAL VENTRICLES yes normal  CHOROID PLEXUS yes normal  CEREBELLUM yes normal  CISTERNA MAGNA yes normal                  FACIAL PROFILE yes normal          DIAPHRAGM yes normal  STOMACH yes normal  RENAL REGION yes normal  BLADDER yes normal      3 VESSEL CORD yes normal               GENITALIA   female     SUSPECTED ABNORMALITIES:  RI 90% with good EDF QUALITY OF SCAN: satisfactory TECHNICIAN COMMENTS: [place] 32+9 wks,cephalic,BPP 5/1,OACZYS ovaries bilat,fhr 150 bpm,anterior placenta gr 3,afi 12 cm,RI .67,.62,.69,=90% w/good EDF A copy of this report including all images has been saved and backed up to a second source for retrieval if needed. All measures and details of the anatomical scan, placentation, fluid volume and pelvic anatomy are contained in that report. Amber Heide Guile 03/15/2019 9:07 AM Clinical Impression and recommendations: I have reviewed the sonogram results above, combined with the patient's current clinical course, below are my impressions and any appropriate recommendations for management based on the sonographic findings. 1.  [id number] Estimated Date of Delivery: 03/28/19 by serial sonographic evaluations 2.  Fetal sonographic surveillance findings: a). Normal fluid volume b). Normal antepartum fetal assessment with BPP 8/8 c). Borderline, but stable, elevated fetal Doppler ratios with consistent diastolic flow 3.  Normal general sonographic findings Recommend continued prenatal evaluations and care based on this sonogram and as clinically indicated from the patient's clinical course. Mertie Clause Eure 03/15/2019 10:48 AM   [place] Ua Cord Doppler  Result Date: 03/08/2019 FOLLOW UP SONOGRAM DERRISHA FOOS is in the office for a follow up sonogram for BPP and cord dopplers. She is a 23 y.o. year old G48P2002 with Estimated Date of Delivery: 03/28/19 by early ultrasound now at  41w1dweeks gestation. Thus far the pregnancy has been complicated by CFort SalongaPRESENTATION:cephalic FETAL ACTIVITY:          Heart rate         130          The fetus is active. AMNIOTIC FLUID: The amniotic fluid volume is  normal, 11 cm. PLACENTA LOCALIZATION:  anterior GRADE 3 CERVIX: Limited view ADNEXA: The ovaries are normal. GESTATIONAL AGE AND  BIOMETRICS: Gestational criteria: Estimated  Date of Delivery: 03/28/19 by early ultrasound now at 331w1drevious Scans:10 BIOPHYSICAL PROFILE:  COMMENTS GROSS BODY MOVEMENT                 2  TONE                2  RESPIRATIONS                2  AMNIOTIC FLUID                2                                                          SCORE:  8/8 (Note: NST was not performed as part of this antepartum testing) DOPPLER FLOW STUDIES: UMBILICAL ARTERY RI RATIOS:  .66,.62,.60=79% ANATOMICAL SURVEY                                                                            COMMENTS CEREBRAL VENTRICLES yes normal  CHOROID PLEXUS yes normal  CEREBELLUM yes normal  CISTERNA MAGNA yes normal                  FACIAL PROFILE yes normal  4 CHAMBERED HEART yes normal  OUTFLOW TRACTS yes normal  DIAPHRAGM yes normal  STOMACH yes normal  RENAL REGION yes normal  BLADDER yes normal      3 VESSEL CORD yes normal              GENITALIA   female     SUSPECTED ABNORMALITIES:  no QUALITY OF SCAN: satisfactory TECHNICIAN COMMENTS: [place] 84+5 wks,cephalic,anterior placenta gr 3,normal ovaries bilat,BPP 8/8,AFI  11 cm,130 BPM,RI .66,.62,.60=79% A copy of this report including all images has been saved and backed up to a second source for retrieval if needed. All measures and details of the anatomical scan, placentation, fluid volume and pelvic anatomy are contained in that report. Amber Heide Guile 03/08/2019 9:01 AM Clinical Impression and recommendations: I have reviewed the sonogram results above, combined with the patient's current clinical course, below are my impressions and any appropriate recommendations for management based on the sonographic findings. 1.  [id number] Estimated Date of Delivery: 03/28/19 by  LMP and confirmed by today's sonographic dating 2. specifically reassuring fetal assessment with good BPP, fluid and excellent Doppler flow 3.   Normal fetal sonographic findings,  specifically normal detailed anatomical evaluation,      no abnormalities noted Recommend routine prenatal care based on this sonogram or as clinically indicated Jonnie Kind 03/08/2019 4:55 PM   [place] Ua Cord Doppler  Result Date: 03/07/2019 FOLLOW UP SONOGRAM IRETTA MANGRUM is in the office for a follow up sonogram for EFW. She is a 23 y.o. year old G43P2002 with Estimated Date of Delivery: 03/28/19 by early ultrasound now at  85w1dweeks gestation. Thus far the pregnancy has been complicated by CHTN,smoker,GDM A2. GESTATION: SINGLETON PRESENTATION: cephalic FETAL ACTIVITY:          Heart rate         150  The fetus is active. AMNIOTIC FLUID: The amniotic fluid volume is  normal, 14 cm. PLACENTA LOCALIZATION:  anterior GRADE 3 CERVIX: Limited view ADNEXA: The ovaries are normal. GESTATIONAL AGE AND  BIOMETRICS: Gestational criteria: Estimated Date of Delivery: 03/28/19 by early ultrasound now at 52w1dPrevious Scans:9          BIPARIETAL DIAMETER           8.45 cm         34 weeks HEAD CIRCUMFERENCE           33.01 cm         37+4 weeks ABDOMINAL CIRCUMFERENCE           34.57 cm         38+3 weeks   97% FEMUR LENGTH           6.94 cm         35+4 weeks                                                       AVERAGE EGA(BY THIS SCAN):  36+2 weeks                                                 ESTIMATED FETAL WEIGHT:       3135  grams, 78 % ANATOMICAL SURVEY                                                                            COMMENTS CEREBRAL VENTRICLES yes normal  CHOROID PLEXUS yes normal  CEREBELLUM yes normal  CISTERNA MAGNA yes normal          NASAL BONE yes normal  NOSE/LIP yes normal  FACIAL PROFILE yes normal  4 CHAMBERED HEART yes normal  OUTFLOW TRACTS yes normal  DIAPHRAGM yes normal  STOMACH yes normal  RENAL REGION yes normal  BLADDER yes normal      3 VESSEL CORD yes normal              GENITALIA   female     SUSPECTED ABNORMALITIES:  no QUALITY OF SCAN: satisfactory TECHNICIAN COMMENTS:  [place]329+5wks,cephalic,anterior placenta gr 3,normal ovaries bilat,BPP 8/8,fhr 150 bpm,afi 14.5 cm,RI .63,.68,.64,.58=77%,EFW 3135 g 78%,AC 97% A copy of this report including all images has been saved and backed up to a second source for retrieval if needed. All measures and details of the anatomical scan, placentation, fluid volume and pelvic anatomy are contained in that report. Amber JHeide Guile9/17/2020 9:15 AM Clinical Impression and recommendations: I have reviewed the sonogram results above, combined with the patient's current clinical course, below are my impressions and any appropriate recommendations for management based on the sonographic findings. 1.  [id number] Date of Delivery: 03/28/19 by serial sonographic evaluations 2.  Fetal sonographic surveillance findings: a). Normal fluid volume b). Normal antepartum fetal assessment with BPP 8/8 c). Normal fetal Doppler  ratios with consistent diastolic flow 16% d). Normal growth percentile with appropriate interval growth 78% 3.  Normal general sonographic findings Recommend continued prenatal evaluations and care based on this sonogram and as clinically indicated from the patient's clinical course. Florian Buff 03/07/2019 8:32 AM   [place] Ua Cord Doppler  Result Date: 02/22/2019 FOLLOW UP SONOGRAM VAUGHN FRIEZE is in the office for a follow up sonogram for BPP and cord dopplers. She is a 23 y.o. year old G75P2002 with Estimated Date of Delivery: 03/28/19 by early ultrasound now at  45w1dweeks gestation. Thus far the pregnancy has been complicated by CHTN,GDM A2. GESTATION: SINGLETON PRESENTATION: cephalic FETAL ACTIVITY:          Heart rate         137          The fetus is active. AMNIOTIC FLUID: The amniotic fluid volume is  normal, 8.6 cm. PLACENTA LOCALIZATION:  anterior GRADE 3 CERVIX: Limited view ADNEXA: The ovaries are normal. GESTATIONAL AGE AND  BIOMETRICS: Gestational criteria: Estimated Date of Delivery: 03/28/19 by early ultrasound now at  362w1drevious Scans:8 BIOPHYSICAL PROFILE:                                                                                                      COMMENTS GROSS BODY MOVEMENT                 2  TONE                2  RESPIRATIONS                2  AMNIOTIC FLUID                2                                                          SCORE:  8/8 (Note: NST was not performed as part of this antepartum testing) DOPPLER FLOW STUDIES: UMBILICAL ARTERY RI RATIOS:   .63,.58,.65=68% ANATOMICAL SURVEY                                                                            COMMENTS CEREBRAL VENTRICLES yes normal  CHOROID PLEXUS yes normal  CEREBELLUM yes normal  CISTERNA MAGNA yes normal          NASAL BONE yes normal  NOSE/LIP yes normal  FACIAL PROFILE yes normal  4 CHAMBERED HEART yes normal  OUTFLOW TRACTS yes normal  DIAPHRAGM yes normal  STOMACH yes normal  RENAL REGION yes normal  BLADDER yes normal  3 VESSEL CORD yes normal              GENITALIA yes normal female     SUSPECTED ABNORMALITIES:  no QUALITY OF SCAN: satisfactory TECHNICIAN COMMENTS: [place] 75+8 wks,cephalic,BPP 8/3,GPQDIYME placenta gr 3,afi 8.6 cm,RI .63,.58,.65=68%,FHR 137 BPM A copy of this report including all images has been saved and backed up to a second source for retrieval if needed. All measures and details of the anatomical scan, placentation, fluid volume and pelvic anatomy are contained in that report. Amber Heide Guile 02/22/2019 9:14 AM Clinical Impression and recommendations: I have reviewed the sonogram results above, combined with the patient's current clinical course, below are my impressions and any appropriate recommendations for management based on the sonographic findings. 1.  [id number] Estimated Date of Delivery: 03/28/19 by serial sonographic evaluations 2.  Fetal sonographic surveillance findings: a). Normal fluid volume b). Normal antepartum fetal assessment with BPP 8/8 c). Normal fetal Doppler ratios with consistent diastolic flow  07% 3.  Normal general sonographic findings Recommend continued prenatal evaluations and care based on this sonogram and as clinically indicated from the patient's clinical course. Florian Buff 02/22/2019 9:27 AM   MAU Course  Procedures  MDM -preeclampsia work-up with HA, vision changes, worsening N/V, epigastric pain, swollen face, per pt report -called and spoke with Dr. Rosana Hoes after obtaining HPI and PE, per Dr. Rosana Hoes, given patient symptoms and current pregnancy concerns (cHTN and A2GDM), will induce today instead of Wednesday. Dr. Rosana Hoes aware CBC resulted, CMP, PCr pending at time of conversation. -EFM: reactive with ?variables       -baseline: 145       -variability: moderate       -accels: present, 15x15       -decels: ?variables       -TOCO: ?ctx -admit to L&D for induction due to symptoms and comorbidities  Orders Placed This Encounter  Procedures  . Culture, OB Urine    Standing Status:   Standing    Number of Occurrences:   1  . SARS Coronavirus 2 [facility] order, Performed in [facility] hospital lab) Nasopharyngeal Nasopharyngeal Swab    Standing Status:   Standing    Number of Occurrences:   1    Order Specific Question:   Is this test for diagnosis or screening    Answer:   Screening    Order Specific Question:   Symptomatic for COVID-19 as defined by CDC    Answer:   No    Order Specific Question:   Hospitalized for COVID-19    Answer:   No    Order Specific Question:   Admitted to ICU for COVID-19    Answer:   No    Order Specific Question:   Previously tested for COVID-19    Answer:   Yes    Order Specific Question:   Resident in a congregate (group) care setting    Answer:   No    Order Specific Question:   Employed in healthcare setting    Answer:   No    Order Specific Question:   Pregnant    Answer:   Yes  . CBC    Standing Status:   Standing    Number of Occurrences:   1  . Comprehensive metabolic panel    Standing Status:   Standing    Number of  Occurrences:   1  . Protein / creatinine ratio, urine    Standing Status:   Standing  Number of Occurrences:   1  . Urinalysis, Routine w reflex microscopic    Standing Status:   Standing    Number of Occurrences:   1  . CBC    Standing Status:   Standing    Number of Occurrences:   1  . RPR    Standing Status:   Standing    Number of Occurrences:   1  . Discontinue Pitocin if tachysystole with non-reassuring FHR is present    Tachysystole is defined as more than 5 contractions in a 10-minute time period averaged over a 30-minute window    Standing Status:   Standing    Number of Occurrences:   1  . Evaluate fetal heart rate to establish reassuring pattern prior to initiating Cytotec or Pitocin    Standing Status:   Standing    Number of Occurrences:   1  . If tachysystole WITH reassuring FHR present notify MD / CNM    tachysystole is defined as more than 5 contractions in a 10 minute time period averaged over a 30 minute window     Standing Status:   Standing    Number of Occurrences:   1  . Initiate intrauterine resuscitation if tachysystole with non-reasuring FHR is present    Tachysystole is defined as more than 5 contractions in a 10-minute time period averaged over a 30-minute window    Standing Status:   Standing    Number of Occurrences:   1  . Labor Induction    Standing Status:   Standing    Number of Occurrences:   1    Order Specific Question:   Clinical Indication    Answer:   chtn, a2dm    Order Specific Question:   Gestational age    Answer:   6    Order Specific Question:   Estimated fetal size    Answer:   AGA    Order Specific Question:   Adequate pelvis    Answer:   Yes    Order Specific Question:   Presenting part    Answer:   Vertex    Order Specific Question:   Bishop score    Answer:   4  . May administer Terbutaline 0.25 mg SQ x 1 dose if tachysystole with non-reassuring FHR is presesnt    Tachysystole is defined as more than 5 contractions in a  10-minute time period averaged over a 30-minute window    Standing Status:   Standing    Number of Occurrences:   1  . Nofify MD/CNM if tachysystole with non-reassuring FHR is present    Tachysystole is defined as more than 5 contractions in a 10-minute time period averaged over a 30-minute    Standing Status:   Standing    Number of Occurrences:   1  . Perform a cervical exam prior to initiating Cytotec or Pitocin    Standing Status:   Standing    Number of Occurrences:   1  . Activity as tolerated    Standing Status:   Standing    Number of Occurrences:   1  . Fetal monitoring per unit policy    Standing Status:   Standing    Number of Occurrences:   1  . Notify physician for vaginal bleeding, acute abdominal pain, temperature >/= 100.4 F, significant change in vital signs, non-reassuring fetal heart rate pattern, imminent delivery or failure to progress    Standing Status:   Standing  Number of Occurrences:   20  . Vitals signs per unit policy    Standing Status:   Standing    Number of Occurrences:   1  . Order Rapid HIV per protocol if no results on chart    Standing Status:   Standing    Number of Occurrences:   1  . Cervical Exam    Unless contraindicated, every 1-2 hours in active labor, or at nurse's discretion    Standing Status:   Standing    Number of Occurrences:   1  . Discontinue foley prior to vaginal delivery    Standing Status:   Standing    Number of Occurrences:   1  . Fundal check post delivery every 15 min x 1 hour then every 30 min x 1 hour    Standing Status:   Standing    Number of Occurrences:   1  . I/O    Standing Status:   Standing    Number of Occurrences:   1  . If Rapid HIV test positive or known HIV positive: initiate AZT orders    Standing Status:   Standing    Number of Occurrences:   1  . Initiate Carrier Fluid Protocol    Standing Status:   Standing    Number of Occurrences:   1  . Initiate Oral Care Protocol    Standing Status:    Standing    Number of Occurrences:   1  . Insert foley catheter    1) If straight catheterized greater than 2 times or patient unable to void post epidural placement.  2) PRN for bladder distention.      Standing Status:   Standing    Number of Occurrences:   1  . May in and out cath x 2 for inability to void    Standing Status:   Standing    Number of Occurrences:   [phone]  . Measure blood pressure post delivery every 15 min x 1 hour then every 30 min x 1 hour    Standing Status:   Standing    Number of Occurrences:   1  . Patient may have epidural placement upon request    Standing Status:   Standing    Number of Occurrences:   1  . Patient may labor/birth in water    Standing Status:   Standing    Number of Occurrences:   1  . Full code    Standing Status:   Standing    Number of Occurrences:   1  . Type and screen         Standing Status:   Standing    Number of Occurrences:   1  . Insert and maintain IV Line    Standing Status:   Standing    Number of Occurrences:   1  . Admit to Inpatient (patient's expected length of stay will be greater than 2 midnights or inpatient only procedure)    Standing Status:   Standing    Number of Occurrences:   1    Order Specific Question:   Hospital Area    Answer:   [place] [[id number]]    Order Specific Question:   Level of Care    Answer:   Labor and Delivery [101]    Order Specific Question:   Covid Evaluation    Answer:   Asymptomatic Screening Protocol (No Symptoms)    Order Specific Question:  Diagnosis    Answer:   GDM, class A2 [030092]    Order Specific Question:   Admitting Physician    Answer:   Donnamae Jude [[id number]]    Order Specific Question:   Attending Physician    Answer:   Merrily Pew    Order Specific Question:   Estimated length of stay    Answer:   past midnight tomorrow    Order Specific Question:   Certification:    Answer:   I certify this patient is being admitted for an  inpatient-only procedure    Order Specific Question:   PT Class (Do Not Modify)    Answer:   Inpatient [101]    Order Specific Question:   PT Acc Code (Do Not Modify)    Answer:   Private [1]   Meds ordered this encounter  Medications  . misoprostol (CYTOTEC) tablet 25 mcg  . terbutaline (BRETHINE) injection 0.25 mg  . lactated ringers infusion  . lactated ringers infusion 500-1,000 mL  . oxytocin (PITOCIN) IV BOLUS FROM BAG  . oxytocin (PITOCIN) IV infusion 40 units in NS 1000 mL - Premix  . acetaminophen (TYLENOL) tablet 650 mg  . fentaNYL (SUBLIMAZE) injection 50-100 mcg  . hydrOXYzine (ATARAX/VISTARIL) tablet 50 mg  . lidocaine (PF) (XYLOCAINE) 1 % injection 30 mL  . ondansetron (ZOFRAN) injection 4 mg  . oxyCODONE-acetaminophen (PERCOCET/ROXICET) 5-325 MG per tablet 1 tablet  . oxyCODONE-acetaminophen (PERCOCET/ROXICET) 5-325 MG per tablet 2 tablet  . FOLLOWED BY Linked Order Group   . penicillin G potassium 5 Million Units in sodium chloride 0.9 % 250 mL IVPB     Order Specific Question:   Antibiotic Indication:     Answer:   Group B Strep Prophylaxis   . penicillin G 3 million units in sodium chloride 0.9% 100 mL IVPB     Order Specific Question:   Antibiotic Indication:     Answer:   Group B Strep Prophylaxis  . sodium citrate-citric acid (ORACIT) solution 30 mL  . sodium phosphate (FLEET) 7-19 GM/118ML enema 1 enema  . misoprostol (CYTOTEC) tablet 50 mcg   Assessment and Plan   1. Chronic hypertension affecting pregnancy   2. Supervision of high risk pregnancy, antepartum   3. Pregnancy headache in third trimester   4. Visual disturbance   5. Nausea and vomiting during pregnancy   6. Gestational diabetes mellitus (GDM) in third trimester controlled on oral hypoglycemic drug   7. Epigastric pain    -admit to L&D for induction  Gerrie Nordmann  03/19/2019, 2:02 PM

## 2019-03-19 NOTE — Progress Notes (Signed)
   NURSE VISIT- NST  SUBJECTIVE:  [name] is a 23 y.o. G52P2002 female at [redacted]w[redacted]d, here for a NST for pregnancy complicated by [facility].  She reports active fetal movement, contractions: none, vaginal bleeding: none, membranes: intact.   OBJECTIVE:  BP 130/78   Pulse 93   Wt 248 lb (112.5 kg)   LMP 09/05/2017   BMI 38.84 kg/m   Appears well, no apparent distress  Results for orders placed or performed in visit on 03/19/19 (from the past 24 hour(s))  POC Urinalysis Dipstick OB   Collection Time: 03/19/19  9:29 AM  Result Value Ref Range   Color, UA     Clarity, UA     Glucose, UA Negative Negative   Bilirubin, UA     Ketones, UA neg    Spec Grav, UA     Blood, UA neg    pH, UA     POC,PROTEIN,UA Small (1+) Negative, Trace, Small (1+), Moderate (2+), Large (3+), 4+   Urobilinogen, UA     Nitrite, UA neg    Leukocytes, UA Negative Negative   Appearance     Odor      NST: FHR baseline 140 bpm, Variability: moderate, Accelerations:present, Decelerations:  Absent= Cat 1 /Reactive Toco: none   ASSESSMENT: G3P2002 at [redacted]w[redacted]d with CHTN NST reactive  PLAN: EFM strip reviewed by Knute Neu, CNM, [facility]   Recommendations: send to [facility] MAU charge nurse notified due to increased blood pressure and 1+protein in urine.  [name]  03/19/2019 10:31 AM

## 2019-03-19 NOTE — Progress Notes (Signed)
1+ proteinuria today at nurse nst visit, which is a change for her. Recommended to go to MAU for pre-e eval, would be induced if +. Currently has IOL scheduled for Wed.  Roma Schanz, CNM, WHNP-BC 03/19/2019 10:15 AM

## 2019-03-19 NOTE — MAU Note (Signed)
Pt sent from MD office for BP evaluation.  Reports BP higher than usual @ MD appointment this morning and proteing present in urine.  Currently has H/A, no meds taken.  Denies visual disturbances and epigastric pain.  Endorses +FM.

## 2019-03-19 NOTE — Progress Notes (Signed)
LABOR PROGRESS NOTE  [name] is a 23 y.o. G3P2002 at [redacted]w[redacted]d  admitted for new onset proteinuria with concerns for preeclampsia.  Subjective: Patient endorsed feeling more pressure.   Objective: BP 117/71   Pulse 72   Temp 98.2 F (36.8 C) (Oral)   Resp 18   Ht 5\' 7"  (1.702 m)   Wt 112.7 kg   LMP 09/05/2017   SpO2 100%   BMI 38.90 kg/m  or  Vitals:   03/19/19 1946 03/19/19 2027 03/19/19 2101 03/19/19 2140  BP: 132/72 125/73 132/78 117/71  Pulse: 98 78 77 72  Resp: 20 18 18 18   Temp:      TempSrc:      SpO2:      Weight:      Height:        Dilation: 4 Effacement (%): 50 Cervical Position: Anterior Station: -2 Presentation: Vertex Exam by:: Nigel Berthold, CNM FHT: baseline rate 155, moderate varibility, + acel, one late decel, intermittent variables. Toco: variable  Labs: Lab Results  Component Value Date   WBC 10.5 03/19/2019   HGB 10.6 (L) 03/19/2019   HCT 30.0 (L) 03/19/2019   MCV 89.6 03/19/2019   PLT 185 03/19/2019    Patient Active Problem List   Diagnosis Date Noted  . GDM, class A2 03/19/2019  . Gestational diabetes mellitus, class A2 12/25/2018  . Supervision of high risk pregnancy, antepartum 08/17/2018  . PTSD (post-traumatic stress disorder) 03/30/2018  . MDD (major depressive disorder), recurrent episode, moderate ([place]) 03/30/2018  . Multiple acquired skin tags 02/18/2017  . Chronic hypertension in pregnancy 01/24/2017  . Abnormal EKG 01/02/2017  . Tachycardia 01/02/2017  . Abnormal Pap smear of cervix 07/26/2016  . Rh negative state in antepartum period 07/20/2016  . Smoker 05/07/2013    Assessment / Plan: 23 y.o. G3P2002 at [redacted]w[redacted]d here for new onset proteinuria with concerns for preeclampsia  Labor: Cervical recheck notable for dilation of 3.5-4cm, less than previously thought. Will wait on AROM at this time. Discussed continuing pitocin vs holding pitocin and trying another cytotec. Opted to give additional cytotec for  further cervical ripening. Cytotec #3 at 2230. Recheck in 4 hours. Ambien 5mg  x1 for sleep. Fetal Wellbeing:  Cat I Pain Control:  Epidural upon request Anticipated MOD:  NSVD  GHTN: Blood pressures normotensive. Will hold evening Labetalol. Can give at later time if blood pressures rise above >140/90.  Mina Marble, D.O. [place], PGY2 03/19/2019, 10:41 PM

## 2019-03-19 NOTE — H&P (Addendum)
LABOR AND DELIVERY ADMISSION HISTORY AND PHYSICAL NOTE  [name] is a 23 y.o. female [phone] with IUP at 68w5dby LMP c/w first trimester [place] who is presenting for IOL because of new onset proteinuria.  Pregnancy is complicated by chronic hypertension, currently on PO labetalol, and A2 DM on metformin.  Patient presented to her primary care this morning was found to have +1 proteinuria and sent to the MAU for evaluation for Pre-E.  Patient is also complaining of headache that has been happening intermittently since July and is related to her high blood pressure. She is complaining today of floaters, epigastric pain that feels like a stabbing pain and happens intermittently, and swollen face.  She reports positive fetal movement. She denies leakage of fluid or vaginal bleeding.  Prenatal History/Complications: CHTN, PO Labetalol GDMA2, metformin Headaches Acid Reflux  Past Medical History: Past Medical History:  Diagnosis Date  . Anxiety   . Asthma    childhood  . Depression    post partum  . Dyspnea   . Headache   . Hypertension   . Irregular menstrual bleeding 01/31/2014  . PTSD (post-traumatic stress disorder)    pTSD  . Recurrent upper respiratory infection (URI)    Bronchitis  . Vaginal Pap smear, abnormal    HPV    Past Surgical History: Past Surgical History:  Procedure Laterality Date  . CHOLECYSTECTOMY    . CYST REMOVAL NECK    . TYMPANOSTOMY TUBE PLACEMENT    . TYMPANOSTOMY TUBE PLACEMENT      Obstetrical History: OB History    Gravida  3   Para  2   Term  2   Preterm      AB      Living  2     SAB      TAB      Ectopic      Multiple  0   Live Births  2           Social History: Social History   Socioeconomic History  . Marital status: Married    Spouse name: Not on file  . Number of children: 2  . Years of education: Not on file  . Highest education level: Not on file  Occupational History  . Not on file  Social Needs   . Financial resource strain: Not on file  . Food insecurity    Worry: Not on file    Inability: Not on file  . Transportation needs    Medical: Not on file    Non-medical: Not on file  Tobacco Use  . Smoking status: Current Every Day Smoker    Packs/day: 1.00    Years: 9.00    Pack years: 9.00    Types: Cigarettes  . Smokeless tobacco: Never Used  Substance and Sexual Activity  . Alcohol use: Not Currently    Comment: socially  . Drug use: No    Types: Marijuana    Comment: denies use 06/20/14  . Sexual activity: Yes    Birth control/protection: None  Lifestyle  . Physical activity    Days per week: Not on file    Minutes per session: Not on file  . Stress: Not on file  Relationships  . Social [place] phone: Not on file    Gets together: Not on file    Attends religious service: Not on file    Active member of club or organization: Not on file  Attends meetings of clubs or organizations: Not on file    Relationship status: Not on file  Other Topics Concern  . Not on file  Social History Narrative  . Not on file    Family History: Family History  Problem Relation Age of Onset  . Bipolar disorder Maternal Grandmother   . Heart disease Maternal Grandfather   . Thyroid disease Paternal Grandmother   . Arthritis Mother   . Fibromyalgia Mother   . Diabetes Mother   . Cancer Mother        vaginal  . Birth defects Father     Allergies: No Known Allergies  Medications Prior to Admission  Medication Sig Dispense Refill Last Dose  . cyclobenzaprine (FLEXERIL) 10 MG tablet Take 1 tablet (10 mg total) by mouth every 8 (eight) hours as needed for muscle spasms. 30 tablet 1 Past Week at Unknown time  . DULoxetine (CYMBALTA) 60 MG capsule Take 1 capsule (60 mg total) by mouth daily. 90 capsule 0 03/18/2019 at 2000  . labetalol (NORMODYNE) 100 MG tablet Take 1 tablet (100 mg total) by mouth 2 (two) times daily. 60 tablet 3 03/19/2019 at 0800  . metFORMIN  (GLUCOPHAGE) 500 MG tablet Take 1 tablet (500 mg total) by mouth at bedtime. 30 tablet 1 03/18/2019 at 2000  . ondansetron (ZOFRAN ODT) 8 MG disintegrating tablet Take 1 tablet (8 mg total) by mouth every 8 (eight) hours as needed for nausea or vomiting. 20 tablet 2 Past Week at Unknown time  . traMADol (ULTRAM) 50 MG tablet Take 1 tablet (50 mg total) by mouth every 6 (six) hours as needed for severe pain (headache). 30 tablet 0 Past Week at Unknown time  . ACCU-CHEK GUIDE test strip Use as instructed 100 each 12   . aspirin EC 81 MG tablet Take 2 tablets (162 mg total) by mouth daily. 60 tablet 6   . Blood Glucose Monitoring Suppl (ACCU-CHEK GUIDE ME) w/Device KIT 1 each by Does not apply route 4 (four) times daily. Needs supplies to take BS QID (has device already) 1 kit prn   . Blood Pressure Monitoring (BLOOD PRESSURE MONITOR AUTOMAT) DEVI Take BP at home daily.  Alert [place] if >140/90 more than once. 1 Device 0   . Doxylamine-Pyridoxine ER (BONJESTA) 20-20 MG TBCR Take 1 tablet by mouth at bedtime. Can add 1 tablet in the morning if needed for nausea and vomiting (Patient not taking: Reported on 03/15/2019) 60 tablet 8   . prenatal vitamin w/FE, FA (PRENATAL 1 + 1) 27-1 MG TABS tablet Take 1 tablet by mouth daily at 12 noon. 30 each 12      Review of Systems   All systems reviewed and negative except as stated in HPI  Blood pressure 130/78, pulse (!) 102, temperature 98.1 F (36.7 C), temperature source Oral, resp. rate 18, height '5\' 7"'$  (1.702 m), weight 112.7 kg, last menstrual period 09/05/2017, SpO2 100 %. General appearance: alert, cooperative and no distress, no facial edema noted Lungs: clear to auscultation bilaterally Heart: regular rate and rhythm Abdomen: soft, non-tender; bowel sounds normal Extremities: No calf swelling or tenderness Presentation: cephalic Fetal monitoring: Category 1, FHR- 130s, moderate variability, + accelerations, no decelerations Uterine activity: No  contractions noted    Prenatal labs: ABO, Rh: O/Negative/-- (03/05 1157) Antibody: Negative (07/10 0903) Rubella: 5.93 (03/05 1157) RPR: Non Reactive (07/10 0903)  HBsAg: Negative (03/05 1157)  HIV: Non Reactive (07/10 0903)  GBS: --/Positive (09/17 1348)  1 hr Glucola:  123 Genetic screening:  Declined  Anatomy US: No obvious abnormalities  Prenatal Transfer Tool  Maternal Diabetes: No Genetic Screening: Declined Maternal Ultrasounds/Referrals: Normal Fetal Ultrasounds or other Referrals:  None Maternal Substance Abuse:  No Significant Maternal Medications:  Meds include: Prozac Significant Maternal Lab Results: Rh negative  Results for orders placed or performed during the hospital encounter of 03/19/19 (from the past 24 hour(s))  CBC   Collection Time: 03/19/19 11:53 AM  Result Value Ref Range   WBC 9.4 4.0 - 10.5 K/uL   RBC 3.56 (L) 3.87 - 5.11 MIL/uL   Hemoglobin 11.1 (L) 12.0 - 15.0 g/dL   HCT 32.2 (L) 36.0 - 46.0 %   MCV 90.4 80.0 - 100.0 fL   MCH 31.2 26.0 - 34.0 pg   MCHC 34.5 30.0 - 36.0 g/dL   RDW 14.6 11.5 - 15.5 %   Platelets 195 150 - 400 K/uL   nRBC 0.0 0.0 - 0.2 %  Comprehensive metabolic panel   Collection Time: 03/19/19 11:53 AM  Result Value Ref Range   Sodium 136 135 - 145 mmol/L   Potassium 3.5 3.5 - 5.1 mmol/L   Chloride 107 98 - 111 mmol/L   CO2 18 (L) 22 - 32 mmol/L   Glucose, Bld 131 (H) 70 - 99 mg/dL   BUN <5 (L) 6 - 20 mg/dL   Creatinine, Ser 0.44 0.44 - 1.00 mg/dL   Calcium 8.8 (L) 8.9 - 10.3 mg/dL   Total Protein 6.1 (L) 6.5 - 8.1 g/dL   Albumin 2.5 (L) 3.5 - 5.0 g/dL   AST 18 15 - 41 U/L   ALT 11 0 - 44 U/L   Alkaline Phosphatase 187 (H) 38 - 126 U/L   Total Bilirubin 0.5 0.3 - 1.2 mg/dL   GFR calc non Af Amer >60 >60 mL/min   GFR calc Af Amer >60 >60 mL/min   Anion gap 11 5 - 15  Urinalysis, Routine w reflex microscopic   Collection Time: 03/19/19 12:12 PM  Result Value Ref Range   Color, Urine AMBER (A) YELLOW   APPearance  CLOUDY (A) CLEAR   Specific Gravity, Urine 1.032 (H) 1.005 - 1.030   pH 5.0 5.0 - 8.0   Glucose, UA NEGATIVE NEGATIVE mg/dL   Hgb urine dipstick NEGATIVE NEGATIVE   Bilirubin Urine MODERATE (A) NEGATIVE   Ketones, ur 5 (A) NEGATIVE mg/dL   Protein, ur 100 (A) NEGATIVE mg/dL   Nitrite NEGATIVE NEGATIVE   Leukocytes,Ua SMALL (A) NEGATIVE   RBC / HPF 0-5 0 - 5 RBC/hpf   WBC, UA 21-50 0 - 5 WBC/hpf   Bacteria, UA MANY (A) NONE SEEN   Squamous Epithelial / LPF 21-50 0 - 5   Mucus PRESENT   Results for orders placed or performed in visit on 03/19/19 (from the past 24 hour(s))  POC Urinalysis Dipstick OB   Collection Time: 03/19/19  9:29 AM  Result Value Ref Range   Color, UA     Clarity, UA     Glucose, UA Negative Negative   Bilirubin, UA     Ketones, UA neg    Spec Grav, UA     Blood, UA neg    pH, UA     POC,PROTEIN,UA Small (1+) Negative, Trace, Small (1+), Moderate (2+), Large (3+), 4+   Urobilinogen, UA     Nitrite, UA neg    Leukocytes, UA Negative Negative   Appearance     Odor      Patient  Active Problem List   Diagnosis Date Noted  . Gestational diabetes mellitus, class A2 12/25/2018  . Supervision of high risk pregnancy, antepartum 08/17/2018  . PTSD (post-traumatic stress disorder) 03/30/2018  . MDD (major depressive disorder), recurrent episode, moderate ([place]) 03/30/2018  . Multiple acquired skin tags 02/18/2017  . Chronic hypertension in pregnancy 01/24/2017  . Abnormal EKG 01/02/2017  . Tachycardia 01/02/2017  . Abnormal Pap smear of cervix 07/26/2016  . Rh negative state in antepartum period 07/20/2016  . Smoker 05/07/2013    Assessment: [name] is a 23 y.o. G3P2002 at 3w5dhere for induction of labor due to newly diagnosed preeclampsia with severe features.  #Labor: No contractions at this time, cervix is 50% effaced.  Buccal Cytotec ordered, will reevaluate in 4 hours.  Patient is dilated 3-4 cm.  Consider pitocin and AROM as  appropriate. #Pain: Epidural as desired #FWB: Category 1, baseline in the 130s with a cells but no decelerations #ID:  GBS positive-PCN #MOF: Undecided on breast or bottle #MOC: Postdelivery tubal, outpatient  Chronic hypertension - Blood pressures have ranged from 130/76-144/83. - Continue home p.o. labetalol 100 mg twice daily - Notify provider if severe range blood pressures, add IV antihypertensives as needed  Gestational diabetes mellitus - Patient takes metformin 500 mg daily at home - Holding metformin at this time - 4 hour blood glucoses while in latent labor, 2 hour CBGs when in active labor  Anticipate vaginal delivery.  C-section as appropriate.  VGifford Shave10/10/2018, 12:50 PM  GME ATTESTATION:  I saw and evaluated the patient. I agree with the findings and the plan of care as documented in the resident's note.  SMerilyn Baba DO OB Fellow, [facility] 03/19/2019 3:14 PM

## 2019-03-19 NOTE — Progress Notes (Signed)
LABOR PROGRESS NOTE  [name] is a 23 y.o. G3P2002 at [redacted]w[redacted]d  admitted for new onset proteinuria with concerns for preeclampsia.  Subjective: Patient is doing well.  Reports she is feeling contractions and feeling very uncomfortable.  She is also feeling pressure in her pelvis.  Patient denies any other complaints at this time and is doing well otherwise  Objective: BP 121/74   Pulse 93   Temp 98.2 F (36.8 C) (Oral)   Resp 16   Ht 5\' 7"  (1.702 m)   Wt 112.7 kg   LMP 09/05/2017   SpO2 100%   BMI 38.90 kg/m  or  Vitals:   03/19/19 1510 03/19/19 1610 03/19/19 1703 03/19/19 1804  BP: 121/62 140/73 105/69 121/74  Pulse: 88 (!) 101 100 93  Resp: 16 18 18 16   Temp: 98.2 F (36.8 C)     TempSrc: Oral     SpO2:      Weight:      Height:        Dilation: 5 Effacement (%): 70, 80 Cervical Position: Anterior Station: -2 Presentation: Vertex Exam by:: Dr. Shawna Orleans FHT: baseline rate 145, moderate varibility, pos acel, neg decel Toco: occasional contractions   Labs: Lab Results  Component Value Date   WBC 10.5 03/19/2019   HGB 10.6 (L) 03/19/2019   HCT 30.0 (L) 03/19/2019   MCV 89.6 03/19/2019   PLT 185 03/19/2019    Patient Active Problem List   Diagnosis Date Noted  . GDM, class A2 03/19/2019  . Gestational diabetes mellitus, class A2 12/25/2018  . Supervision of high risk pregnancy, antepartum 08/17/2018  . PTSD (post-traumatic stress disorder) 03/30/2018  . MDD (major depressive disorder), recurrent episode, moderate ([place]) 03/30/2018  . Multiple acquired skin tags 02/18/2017  . Chronic hypertension in pregnancy 01/24/2017  . Abnormal EKG 01/02/2017  . Tachycardia 01/02/2017  . Abnormal Pap smear of cervix 07/26/2016  . Rh negative state in antepartum period 07/20/2016  . Smoker 05/07/2013    Assessment / Plan: 23 y.o. G3P2002 at [redacted]w[redacted]d here for IOL for chronic hypertension.  Labor: Progressing well, Cytotec x1 last dose of 1408.  Currently on pit   Fetal Wellbeing: Well-appearing category 1 strip Pain Control: Epidural when requested Anticipated MOD: Vaginal  Gifford Shave, MD  PGY-1, [facility]  03/19/2019, 7:51 PM

## 2019-03-19 NOTE — MAU Note (Signed)
Covid swab collected.PT tolerated well. Asymptomatic 

## 2019-03-20 ENCOUNTER — Inpatient Hospital Stay (HOSPITAL_COMMUNITY): Payer: BC Managed Care – PPO | Admitting: Anesthesiology

## 2019-03-20 ENCOUNTER — Encounter (HOSPITAL_COMMUNITY)
Admission: AD | Disposition: A | Discharge status: Home / Self Care | Payer: Self-pay | Source: Home / Self Care | Attending: Family Medicine

## 2019-03-20 ENCOUNTER — Encounter (HOSPITAL_COMMUNITY): Payer: Self-pay | Admitting: *Deleted

## 2019-03-20 DIAGNOSIS — O99824 Streptococcus B carrier state complicating childbirth: Secondary | ICD-10-CM

## 2019-03-20 DIAGNOSIS — O1002 Pre-existing essential hypertension complicating childbirth: Secondary | ICD-10-CM

## 2019-03-20 DIAGNOSIS — I10 Essential (primary) hypertension: Secondary | ICD-10-CM

## 2019-03-20 DIAGNOSIS — Z302 Encounter for sterilization: Secondary | ICD-10-CM

## 2019-03-20 DIAGNOSIS — O24425 Gestational diabetes mellitus in childbirth, controlled by oral hypoglycemic drugs: Secondary | ICD-10-CM

## 2019-03-20 DIAGNOSIS — Z3A38 38 weeks gestation of pregnancy: Secondary | ICD-10-CM

## 2019-03-20 DIAGNOSIS — O10919 Unspecified pre-existing hypertension complicating pregnancy, unspecified trimester: Secondary | ICD-10-CM

## 2019-03-20 HISTORY — PX: TUBAL LIGATION: SHX77

## 2019-03-20 LAB — CBC
HCT: 28.4 % — ABNORMAL LOW (ref 36.0–46.0)
Hemoglobin: 10.1 g/dL — ABNORMAL LOW (ref 12.0–15.0)
MCH: 31.9 pg (ref 26.0–34.0)
MCHC: 35.6 g/dL (ref 30.0–36.0)
MCV: 89.6 fL (ref 80.0–100.0)
Platelets: 154 10*3/uL (ref 150–400)
RBC: 3.17 MIL/uL — ABNORMAL LOW (ref 3.87–5.11)
RDW: 14.5 % (ref 11.5–15.5)
WBC: 9.5 10*3/uL (ref 4.0–10.5)
nRBC: 0 % (ref 0.0–0.2)

## 2019-03-20 LAB — GLUCOSE, CAPILLARY
Glucose-Capillary: 97 mg/dL (ref 70–99)
Glucose-Capillary: 84 mg/dL (ref 70–99)
Glucose-Capillary: 79 mg/dL (ref 70–99)
Glucose-Capillary: 72 mg/dL (ref 70–99)
Glucose-Capillary: 77 mg/dL (ref 70–99)

## 2019-03-20 LAB — CULTURE, OB URINE: Culture: <10000 — AB

## 2019-03-20 LAB — RPR: RPR Ser Ql: NONREACTIVE

## 2019-03-20 SURGERY — LIGATION, FALLOPIAN TUBE, POSTPARTUM
Anesthesia: Epidural | Wound class: Clean

## 2019-03-20 MED ORDER — LIDOCAINE-EPINEPHRINE (PF) 2 %-1:200000 IJ SOLN
INTRAMUSCULAR | Status: AC
  Filled 2019-03-20: qty 10

## 2019-03-20 MED ORDER — DIPHENHYDRAMINE HCL 50 MG/ML IJ SOLN: 12.5000 mg | INTRAMUSCULAR | Status: DC | PRN

## 2019-03-20 MED ORDER — IBUPROFEN 600 MG PO TABS
600.0000 mg | ORAL_TABLET | Freq: Four times a day (QID) | ORAL | Status: DC | PRN
  Administered 2019-03-21 (×3): 600 mg via ORAL
  Filled 2019-03-20 (×3): qty 1

## 2019-03-20 MED ORDER — OXYCODONE HCL 5 MG PO TABS
10.0000 mg | ORAL_TABLET | ORAL | Status: DC | PRN
  Administered 2019-03-21 (×4): 10 mg via ORAL
  Filled 2019-03-20 (×4): qty 2

## 2019-03-20 MED ORDER — FENTANYL-BUPIVACAINE-NACL 0.5-0.125-0.9 MG/250ML-% EP SOLN
12.0000 mL/h | EPIDURAL | Status: DC | PRN
  Filled 2019-03-20: qty 250

## 2019-03-20 MED ORDER — SENNOSIDES-DOCUSATE SODIUM 8.6-50 MG PO TABS
2.0000 | ORAL_TABLET | ORAL | Status: DC
  Administered 2019-03-20: 2 via ORAL
  Filled 2019-03-20: qty 2

## 2019-03-20 MED ORDER — SODIUM CHLORIDE (PF) 0.9 % IJ SOLN
INTRAMUSCULAR | Status: DC | PRN
  Administered 2019-03-20: 12 mL/h via EPIDURAL

## 2019-03-20 MED ORDER — COCONUT OIL OIL: 1.0000 "application " | TOPICAL_OIL | Status: DC | PRN

## 2019-03-20 MED ORDER — DIPHENHYDRAMINE HCL 25 MG PO CAPS: 25.0000 mg | ORAL_CAPSULE | Freq: Four times a day (QID) | ORAL | Status: DC | PRN

## 2019-03-20 MED ORDER — MIDAZOLAM HCL 2 MG/2ML IJ SOLN
INTRAMUSCULAR | Status: AC
  Filled 2019-03-20: qty 2

## 2019-03-20 MED ORDER — MISOPROSTOL 25 MCG QUARTER TABLET
ORAL_TABLET | ORAL | Status: AC
  Filled 2019-03-20: qty 1

## 2019-03-20 MED ORDER — FENTANYL CITRATE (PF) 100 MCG/2ML IJ SOLN
INTRAMUSCULAR | Status: DC | PRN
  Administered 2019-03-20 (×4): 50 ug via INTRAVENOUS

## 2019-03-20 MED ORDER — ONDANSETRON HCL 4 MG/2ML IJ SOLN
INTRAMUSCULAR | Status: AC
  Filled 2019-03-20: qty 2

## 2019-03-20 MED ORDER — BENZOCAINE-MENTHOL 20-0.5 % EX AERO: 1.0000 "application " | INHALATION_SPRAY | CUTANEOUS | Status: DC | PRN

## 2019-03-20 MED ORDER — MISOPROSTOL 200 MCG PO TABS
ORAL_TABLET | ORAL | Status: AC
  Filled 2019-03-20: qty 4

## 2019-03-20 MED ORDER — DIBUCAINE (PERIANAL) 1 % EX OINT: 1.0000 "application " | TOPICAL_OINTMENT | CUTANEOUS | Status: DC | PRN

## 2019-03-20 MED ORDER — EPHEDRINE 5 MG/ML INJ: 10.0000 mg | INTRAVENOUS | Status: DC | PRN

## 2019-03-20 MED ORDER — ONDANSETRON HCL 4 MG/2ML IJ SOLN
INTRAMUSCULAR | Status: DC | PRN
  Administered 2019-03-20: 4 mg via INTRAVENOUS

## 2019-03-20 MED ORDER — KETOROLAC TROMETHAMINE 30 MG/ML IJ SOLN
INTRAMUSCULAR | Status: AC
  Filled 2019-03-20: qty 1

## 2019-03-20 MED ORDER — KETOROLAC TROMETHAMINE 30 MG/ML IJ SOLN
INTRAMUSCULAR | Status: DC | PRN
  Administered 2019-03-20: 30 mg via INTRAVENOUS

## 2019-03-20 MED ORDER — PHENYLEPHRINE 40 MCG/ML (10ML) SYRINGE FOR IV PUSH (FOR BLOOD PRESSURE SUPPORT): 80.0000 ug | PREFILLED_SYRINGE | INTRAVENOUS | Status: DC | PRN

## 2019-03-20 MED ORDER — LACTATED RINGERS IV SOLN
INTRAVENOUS | Status: DC | PRN
  Administered 2019-03-20: 19:00:00 via INTRAVENOUS

## 2019-03-20 MED ORDER — TRANEXAMIC ACID-NACL 1000-0.7 MG/100ML-% IV SOLN: 1000.0000 mg | Freq: Once | INTRAVENOUS | Status: DC | PRN

## 2019-03-20 MED ORDER — OXYCODONE HCL 5 MG PO TABS
5.0000 mg | ORAL_TABLET | ORAL | Status: DC | PRN
  Administered 2019-03-20: 5 mg via ORAL
  Filled 2019-03-20: qty 1

## 2019-03-20 MED ORDER — ACETAMINOPHEN 325 MG PO TABS
650.0000 mg | ORAL_TABLET | Freq: Four times a day (QID) | ORAL | Status: DC | PRN
  Administered 2019-03-20 – 2019-03-21 (×2): 650 mg via ORAL
  Filled 2019-03-20 (×3): qty 2

## 2019-03-20 MED ORDER — BUPIVACAINE HCL (PF) 0.25 % IJ SOLN
INTRAMUSCULAR | Status: DC | PRN
  Administered 2019-03-20: 20 mL

## 2019-03-20 MED ORDER — SIMETHICONE 80 MG PO CHEW: 80.0000 mg | CHEWABLE_TABLET | ORAL | Status: DC | PRN

## 2019-03-20 MED ORDER — TRANEXAMIC ACID-NACL 1000-0.7 MG/100ML-% IV SOLN
1000.0000 mg | INTRAVENOUS | Status: AC
  Administered 2019-03-20: 18:00:00 1000 mg via INTRAVENOUS

## 2019-03-20 MED ORDER — ONDANSETRON HCL 4 MG PO TABS: 4.0000 mg | ORAL_TABLET | ORAL | Status: DC | PRN

## 2019-03-20 MED ORDER — LACTATED RINGERS IV SOLN: 500.0000 mL | Freq: Once | INTRAVENOUS | Status: DC

## 2019-03-20 MED ORDER — BUPIVACAINE HCL (PF) 0.25 % IJ SOLN
INTRAMUSCULAR | Status: AC
  Filled 2019-03-20: qty 30

## 2019-03-20 MED ORDER — PRENATAL MULTIVITAMIN CH
1.0000 | ORAL_TABLET | Freq: Every day | ORAL | Status: DC
  Administered 2019-03-21: 1 via ORAL
  Filled 2019-03-20: qty 1

## 2019-03-20 MED ORDER — MISOPROSTOL 200 MCG PO TABS
800.0000 ug | ORAL_TABLET | Freq: Once | ORAL | Status: AC
  Administered 2019-03-20: 800 ug via RECTAL

## 2019-03-20 MED ORDER — WITCH HAZEL-GLYCERIN EX PADS: 1.0000 "application " | MEDICATED_PAD | CUTANEOUS | Status: DC | PRN

## 2019-03-20 MED ORDER — MEASLES, MUMPS & RUBELLA VAC IJ SOLR: 0.5000 mL | Freq: Once | INTRAMUSCULAR | Status: DC

## 2019-03-20 MED ORDER — ONDANSETRON HCL 4 MG/2ML IJ SOLN: 4.0000 mg | INTRAMUSCULAR | Status: DC | PRN

## 2019-03-20 MED ORDER — TETANUS-DIPHTH-ACELL PERTUSSIS 5-2.5-18.5 LF-MCG/0.5 IM SUSP: 0.5000 mL | Freq: Once | INTRAMUSCULAR | Status: DC

## 2019-03-20 MED ORDER — FENTANYL CITRATE (PF) 250 MCG/5ML IJ SOLN
INTRAMUSCULAR | Status: AC
  Filled 2019-03-20: qty 5

## 2019-03-20 MED ORDER — MISOPROSTOL 25 MCG QUARTER TABLET
25.0000 ug | ORAL_TABLET | Freq: Once | ORAL | Status: AC
  Administered 2019-03-20: 25 ug via VAGINAL

## 2019-03-20 MED ORDER — MIDAZOLAM HCL 2 MG/2ML IJ SOLN
INTRAMUSCULAR | Status: DC | PRN
  Administered 2019-03-20 (×2): 1 mg via INTRAVENOUS

## 2019-03-20 MED ORDER — LIDOCAINE HCL (PF) 1 % IJ SOLN
INTRAMUSCULAR | Status: DC | PRN
  Administered 2019-03-20 (×3): 5 mL via EPIDURAL

## 2019-03-20 MED ORDER — TRANEXAMIC ACID-NACL 1000-0.7 MG/100ML-% IV SOLN
INTRAVENOUS | Status: AC
  Filled 2019-03-20: qty 100

## 2019-03-20 MED ORDER — LIDOCAINE-EPINEPHRINE (PF) 2 %-1:200000 IJ SOLN
INTRAMUSCULAR | Status: DC | PRN
  Administered 2019-03-20 (×3): 5 mL via EPIDURAL

## 2019-03-20 SURGICAL SUPPLY — 28 items
APL SKNCLS STERI-STRIP NONHPOA (GAUZE/BANDAGES/DRESSINGS) ×1
BENZOIN TINCTURE PRP APPL 2/3 (GAUZE/BANDAGES/DRESSINGS) ×1 IMPLANT
BLADE SURG 11 STRL SS (BLADE) ×2 IMPLANT
CLSR STERI-STRIP ANTIMIC 1/2X4 (GAUZE/BANDAGES/DRESSINGS) ×1 IMPLANT
DRESSING OPSITE X SMALL 2X3 (GAUZE/BANDAGES/DRESSINGS) ×1 IMPLANT
DRSG OPSITE POSTOP 3X4 (GAUZE/BANDAGES/DRESSINGS) ×2 IMPLANT
DURAPREP 26ML APPLICATOR (WOUND CARE) ×2 IMPLANT
GLOVE BIOGEL PI IND STRL 7.0 (GLOVE) ×1 IMPLANT
GLOVE BIOGEL PI IND STRL 7.5 (GLOVE) ×1 IMPLANT
GLOVE BIOGEL PI INDICATOR 7.0 (GLOVE) ×1
GLOVE BIOGEL PI INDICATOR 7.5 (GLOVE) ×1
GLOVE ECLIPSE 7.5 STRL STRAW (GLOVE) ×2 IMPLANT
GOWN STRL REUS W/TWL LRG LVL3 (GOWN DISPOSABLE) ×4 IMPLANT
HIBICLENS CHG 4% 4OZ BTL (MISCELLANEOUS) ×2 IMPLANT
NEEDLE HYPO 22GX1.5 SAFETY (NEEDLE) ×2 IMPLANT
NS IRRIG 1000ML POUR BTL (IV SOLUTION) ×2 IMPLANT
PACK ABDOMINAL MINOR (CUSTOM PROCEDURE TRAY) ×2 IMPLANT
PROTECTOR NERVE ULNAR (MISCELLANEOUS) ×2 IMPLANT
SPONGE LAP 4X18 RFD (DISPOSABLE) IMPLANT
SUT PLAIN 2 0 (SUTURE) ×6
SUT PLAIN ABS 2-0 CT1 27XMFL (SUTURE) IMPLANT
SUT VIC AB 3-0 SH 27 (SUTURE) ×2
SUT VIC AB 3-0 SH 27X BRD (SUTURE) IMPLANT
SUT VICRYL 0 UR6 27IN ABS (SUTURE) ×2 IMPLANT
SUT VICRYL 4-0 PS2 18IN ABS (SUTURE) ×2 IMPLANT
SYR CONTROL 10ML LL (SYRINGE) ×2 IMPLANT
TOWEL OR 17X24 6PK STRL BLUE (TOWEL DISPOSABLE) ×4 IMPLANT
TRAY FOLEY W/BAG SLVR 14FR (SET/KITS/TRAYS/PACK) ×2 IMPLANT

## 2019-03-20 NOTE — Progress Notes (Signed)
Labor Progress Note [name] is a 23 y.o. G3P2002 at [redacted]w[redacted]d presented for cHTN with proteinuria. Pre-E labs negative. GDMA2. S: Starting to feel more cramping.   O:  BP 108/65   Pulse 88   Temp 98.4 F (36.9 C) (Oral)   Resp 20   Ht 5\' 7"  (1.702 m)   Wt 112.7 kg   LMP 09/05/2017   SpO2 100%   BMI 38.90 kg/m  EFM: 140, reactive, moderate variability, pos accels and no decels Ctx: Not tracing currently   CVE: Dilation: 4 Effacement (%): 50 Cervical Position: Anterior Station: -2 Presentation: Vertex Exam by:: J.Cox, RN   A&P: 23 y.o. O1H0865 [redacted]w[redacted]d here for cHTN with proteinuria. Pre-E labs negative. GDMA2.  #Labor: Progressing well. S/p Cytotec x2. Pit since [place] on 10/5.  #Pain: IV pain meds; Epidural when desires #FWB: Cat I #GBS positive; PCN since 1419 on 10/5 #cHTN: has been on Labetalol 100 mg BID; will discontinue as BP's normal to low; cont to monitor; Pre-E labs negative #GDMA2: Last glucose 84  Chauncey Mann, MD 9:34 AM

## 2019-03-20 NOTE — Anesthesia Postprocedure Evaluation (Signed)
Anesthesia Post Note  Patient: [name]  Procedure(s) Performed: POST PARTUM TUBAL LIGATION (N/A )     Patient location during evaluation: Mother Baby Anesthesia Type: Epidural Level of consciousness: oriented and awake and alert Pain management: pain level controlled Vital Signs Assessment: post-procedure vital signs reviewed and stable Respiratory status: spontaneous breathing and respiratory function stable Cardiovascular status: blood pressure returned to baseline and stable Postop Assessment: no headache, no backache, no apparent nausea or vomiting and able to ambulate Anesthetic complications: no    Last Vitals:  Vitals:   03/20/19 2113 03/20/19 2115  BP: 130/86 (!) 147/93  Pulse: 68 75  Resp: 18 18  Temp: 36.9 C 37.1 C  SpO2: 100% 99%    Last Pain:  Vitals:   03/20/19 2220  TempSrc:   PainSc: 6    Pain Goal:                   Barnet [place]

## 2019-03-20 NOTE — Lactation Note (Signed)
This note was copied from a baby's chart. Lactation Consultation Note  Patient Name: [name] DGLOV'F Date: 03/20/2019 Reason for consult: Initial assessment;Early term 37-38.6wks;Maternal endocrine disorder Type of Endocrine Disorder?: Diabetes P3, 5 hour female ETI infant. Mom IE:PPIR, GDM and mom is a smoker. Infant had one void since birth. Per mom, she receives [facility] in [facility] and she has DEBP at home. LC did not see latch at this time, per mom, infant previously breastfed for 15 minutes and infant has latched three times since birth.  Mom feels breastfeeding is going well, infant is latching better than her previously children  who only breastfed while in hospital. Mom knows to breastfeed according hunger cues, 8 to 12 times within 24 hours and on demand. LC discussed parents to do STS as much as possible. Mom knows to call Nurse or [facility] if she has any questions, concerns or need assistance with latching infant to breast. Mom made aware of O/P services, breastfeeding support groups, community resources, and our phone # for post-discharge questions.   Maternal Data Formula Feeding for Exclusion: Yes Reason for exclusion: Mother's choice to formula and breast feed on admission Has patient been taught Hand Expression?: Yes(Per mom,she knows how to hand express.)  Feeding Feeding Type: Breast Fed  LATCH Score Latch: Repeated attempts needed to sustain latch, nipple held in mouth throughout feeding, stimulation needed to elicit sucking reflex.  Audible Swallowing: None  Type of Nipple: Everted at rest and after stimulation  Comfort (Breast/Nipple): Soft / non-tender  Hold (Positioning): No assistance needed to correctly position infant at breast.  LATCH Score: 7  Interventions Interventions: Breast feeding basics reviewed;Skin to skin;DEBP;Hand express  Lactation Tools Discussed/Used     Consult Status Consult Status: Follow-up Date: 03/21/19 Follow-up  type: In-patient    Vicente Serene 03/20/2019, 11:04 PM

## 2019-03-20 NOTE — Anesthesia Preprocedure Evaluation (Signed)
Anesthesia Evaluation  Patient identified by MRN, date of birth, ID band Patient awake    Reviewed: Allergy & Precautions, H&P , NPO status , Patient's Chart, lab work & pertinent test results  Airway Mallampati: II  TM Distance: >3 FB Neck ROM: full    Dental no notable dental hx. (+) Teeth Intact   Pulmonary Current Smoker,    Pulmonary exam normal breath sounds clear to auscultation       Cardiovascular hypertension, Normal cardiovascular exam Rhythm:regular Rate:Normal     Neuro/Psych PSYCHIATRIC DISORDERS Anxiety Depression    GI/Hepatic negative GI ROS, Neg liver ROS,   Endo/Other  diabetesMorbid obesity  Renal/GU negative Renal ROS     Musculoskeletal   Abdominal (+) + obese,   Peds  Hematology negative hematology ROS (+)   Anesthesia Other Findings   Reproductive/Obstetrics (+) Pregnancy                             Anesthesia Physical  Anesthesia Plan  ASA: III  Anesthesia Plan: Epidural   Post-op Pain Management:    Induction:   PONV Risk Score and Plan:   Airway Management Planned:   Additional Equipment:   Intra-op Plan:   Post-operative Plan:   Informed Consent: I have reviewed the patients History and Physical, chart, labs and discussed the procedure including the risks, benefits and alternatives for the proposed anesthesia with the patient or authorized representative who has indicated his/her understanding and acceptance.     Dental advisory given  Plan Discussed with: Anesthesiologist  Anesthesia Plan Comments:         Anesthesia Quick Evaluation

## 2019-03-20 NOTE — Transfer of Care (Signed)
Immediate Anesthesia Transfer of Care Note  Patient: [name]  Procedure(s) Performed: POST PARTUM TUBAL LIGATION (N/A )  Patient Location: PACU  Anesthesia Type:Epidural  Level of Consciousness: awake, alert  and oriented  Airway & Oxygen Therapy: Patient Spontanous Breathing  Post-op Assessment: Report given to RN and Post -op Vital signs reviewed and stable  Post vital signs: Reviewed and stable  Last Vitals:  Vitals Value Taken Time  BP 110/61 03/20/19 1947  Temp    Pulse 75 03/20/19 1950  Resp 18 03/20/19 1950  SpO2 100 % 03/20/19 1950  Vitals shown include unvalidated device data.  Last Pain:  Vitals:   03/20/19 1701  TempSrc: (P) Oral  PainSc:          Complications: No apparent anesthesia complications

## 2019-03-20 NOTE — Progress Notes (Signed)
Patient ID: [name], female   DOB: 13-Jul-1995, 23 y.o.   MRN: [id number]  Risks of procedure discussed with patient including but not limited to: risk of regret, permanence of method, bleeding, infection, injury to surrounding organs and need for additional procedures.  Failure risk of 1 -2 % with increased risk of ectopic gestation if pregnancy occurs was also discussed with patient.    Truett Mainland, DO 03/20/2019 6:12 PM

## 2019-03-20 NOTE — Addendum Note (Signed)
Addendum  created 03/20/19 2355 by Kaylise Blakeley, Waynard Reeds, CRNA   Intraprocedure Event edited, Intraprocedure Staff edited

## 2019-03-20 NOTE — Lactation Note (Deleted)
This note was copied from a baby's chart. Lactation Consultation Note  Patient Name: [name] Date: 03/20/2019 Reason for consult: Initial assessment;Early term 37-38.6wks;Maternal endocrine disorder Type of Endocrine Disorder?: Diabetes   Maternal Data Formula Feeding for Exclusion: Yes Reason for exclusion: Mother's choice to formula and breast feed on admission Has patient been taught Hand Expression?: Yes(Per mom,she knows how to hand express.)  Feeding Feeding Type: Breast Fed  LATCH Score Latch: Repeated attempts needed to sustain latch, nipple held in mouth throughout feeding, stimulation needed to elicit sucking reflex.  Audible Swallowing: None  Type of Nipple: Everted at rest and after stimulation  Comfort (Breast/Nipple): Soft / non-tender  Hold (Positioning): No assistance needed to correctly position infant at breast.  LATCH Score: 7  Interventions Interventions: Breast feeding basics reviewed;Skin to skin;DEBP;Hand express  Lactation Tools Discussed/Used     Consult Status Consult Status: Follow-up Date: 03/21/19 Follow-up type: In-patient    Vicente Serene 03/20/2019, 11:09 PM

## 2019-03-20 NOTE — Progress Notes (Signed)
Labor Progress Note [name] is a 23 y.o. G3P2002 at [redacted]w[redacted]d presented for cHTN with proteinuria. Pre-E labs negative. GDMA2. S: Comfortable.   O:  BP (!) 109/57   Pulse 68   Temp 98.4 F (36.9 C) (Oral)   Resp 20   Ht 5\' 7"  (1.702 m)   Wt 112.7 kg   LMP 09/05/2017   SpO2 100%   BMI 38.90 kg/m  EFM: 140, reactive, moderate variability, pos accels and no decels Ctx: q2-17m  CVE: Dilation: 5 Effacement (%): 60 Cervical Position: Anterior Station: -2 Presentation: Vertex Exam by:: Jisel Fleet   A&P: 23 y.o. [id number] [redacted]w[redacted]d here for cHTN with proteinuria. Pre-E labs negative. GDMA2.  #Labor: Progressing well. S/p Cytotec x2. Pit since [place] on 10/5. AROM this check with clear fluid. Anticipate SVD. #Pain: Epidural  #FWB: Cat I #GBS positive; PCN since 1419 on 10/5 #cHTN: has been on Labetalol 100 mg BID; will discontinue as BP's normal to low; cont to monitor; Pre-E labs negative #GDMA2: Last glucose 84  Chauncey Mann, MD 12:17 PM

## 2019-03-20 NOTE — Anesthesia Procedure Notes (Signed)
Epidural Patient location during procedure: OB Start time: 03/20/2019 10:49 AM End time: 03/20/2019 11:00 AM  Staffing Anesthesiologist: Duane Boston, MD Performed: anesthesiologist   Preanesthetic Checklist Completed: patient identified, site marked, pre-op evaluation, timeout performed, IV checked, risks and benefits discussed and monitors and equipment checked  Epidural Patient position: sitting Prep: DuraPrep Patient monitoring: heart rate, cardiac monitor, continuous pulse ox and blood pressure Approach: midline Location: L2-L3 Injection technique: LOR saline  Needle:  Needle type: Tuohy  Needle gauge: 17 G Needle length: 9 cm Needle insertion depth: 8 cm Catheter size: 20 Guage Catheter at skin depth: 12 cm Test dose: negative and Other  Assessment Events: blood not aspirated, injection not painful, no injection resistance and negative IV test  Additional Notes Informed consent obtained prior to proceeding including risk of failure, 1% risk of PDPH, risk of minor discomfort and bruising.  Discussed rare but serious complications including epidural abscess, permanent nerve injury, epidural hematoma.  Discussed alternatives to epidural analgesia and patient desires to proceed.  Timeout performed pre-procedure verifying patient name, procedure, and platelet count.  Patient tolerated procedure well.

## 2019-03-20 NOTE — Progress Notes (Signed)
Patient Vitals for the past 4 hrs:  BP Temp Temp src Pulse Resp  03/20/19 0602 (!) 147/90 - - 93 20  03/20/19 0501 126/68 - - 83 18  03/20/19 0401 126/61 98.4 F (36.9 C) Oral 70 16  03/20/19 0301 104/62 - - 81 16   Received cytotec X2, cx exam delayed for now d/t pt wanting to eat a light laboring diet before starting pitocin.  Ctx mild, q 2-4 minutes. FHR Cat 1.  Will check cx and start pitocin after pt eats

## 2019-03-20 NOTE — Op Note (Signed)
[name] 03/19/2019 - 03/20/2019  PREOPERATIVE DIAGNOSES: Multiparity, undesired fertility  POSTOPERATIVE DIAGNOSES: Multiparity, undesired fertility  PROCEDURE:  Postpartum Bilateral Tubal Sterilization in the [employer] fashion  SURGEON: Dr. Loma Boston Assistant: Barrington Ellison, MD  ANESTHESIA:  Epidural and local analgesia using 30 ml of 1.0% Marcaine  COMPLICATIONS:  None immediate.  ESTIMATED BLOOD LOSS: 10 ml.  INDICATIONS:  23 y.o. [id number] with undesired fertility, status post vaginal delivery, desires permanent sterilization.  Other reversible forms of contraception were discussed with patient; she declines all other modalities. Risks of procedure discussed with patient including but not limited to: risk of regret, permanence of method, bleeding, infection, injury to surrounding organs and need for additional procedures.  Failure risk of 1% with increased risk of ectopic gestation if pregnancy occurs and possibility of post-tubal pain syndrome also discussed with patient.      FINDINGS:  Normal uterus, tubes, and ovaries.  PROCEDURE DETAILS: The patient was taken to the operating room where her epidural anesthesia was dosed up to surgical level and found to be adequate.  She was then placed in the dorsal supine position and prepped and draped in sterile fashion.  After an adequate timeout was performed, attention was turned to the patient's abdomen where a small transverse skin incision was made under the umbilical fold. The incision was taken down to the layer of fascia using the scalpel, and fascia was incised, and extended bilaterally using Mayo scissors. The peritoneum was entered in a sharp fashion. Attention was then turned to the patient's uterus, and  left fallopian tube was then identified, and the Babcock clamp was then used to grasp the tube approximately 2 cm from the cornual region. A 2 cm segment of the tube was then doubly ligated with free tie of plain gut suture,  transected and excised. Good hemostasis was noted. The right fallopian tube was then identified, doubly ligated, and a 2 cm segment excised in a similar fashion allowing for bilateral tubal sterilization.  Good hemostasis was noted overall.  The instruments were then removed from the patient's abdomen and the fascial incision was repaired with 0 Vicryl, and the skin was closed with a 4-0 Vicryl subcuticular stitch. The patient tolerated the procedure well.  Instrument, sponge, and needle counts were correct times three.  The patient was then taken to the recovery room awake and in stable condition.  Barrington Ellison, MD [facility] Family Medicine Fellow, [facility] for [employer], [place]

## 2019-03-20 NOTE — Discharge Summary (Signed)
Postpartum Discharge Summary  Date of Service updated 03/21/19     Patient Name: [name] DOB: 09-29-1995 MRN: [id number]  Date of admission: 03/19/2019 Delivering Provider: Chauncey Mann   Date of discharge: 03/21/2019  Admitting diagnosis: Pre E evaluation Intrauterine pregnancy: [redacted]w[redacted]d    Secondary diagnosis:  Active Problems:   Smoker   Rh negative state in antepartum period   MDD (major depressive disorder), recurrent episode, moderate (HCC)   GDM, class A2   [redacted] weeks gestation of pregnancy   Chronic hypertension affecting pregnancy  Additional problems: None     Discharge diagnosis: Term Pregnancy Delivered, CHTN and GDM A2                                                                                                Post partum procedures:rhogam not needed, baby Rh-. Postpartum BTL  Augmentation: AROM, Pitocin and Cytotec  Complications: None  Hospital course:  Induction of Labor With Vaginal Delivery   23y.o. yo [id number] admitted to the hospital 03/19/2019 for induction of labor.  Indication for induction: cHTN with proteinuria and GDMA2. Pre-E labs negative. Patient also GDMA2. Initial SVE: 3/50%-30 Patient received Cytotec, Pit and had AROM. She received an Epidural. She then progressed to complete. Membrane Rupture Time/Date: 12:07 PM ,03/20/2019   Intrapartum Procedures: Episiotomy: None [1]                                         Lacerations:  None [1]  Patient had delivery of a Viable infant.  Information for the patient's newborn:  WaSharlot, Sturkey0[[id number]]Delivery Method: Vag-Spont    03/20/2019  Details of delivery can be found in separate delivery note. Labetalol discontinued on admission due to normal to low BP's. Patient had a routine postpartum course. BP's monitored post-partum- only 1 elevated 147/93 last night @ 2115, all others have been normal. 130s/60-70s since. Fasting glucose post-partum 113. Patient is discharged home  03/21/19 per her request. Will check BPs QID at home and let usKoreanow if elevated. Note routed to FT to schedule online bp check for Friday. Reviewed pre-e s/s, reasons to return to care.  Eating, drinking, voiding, ambulating well.  +flatus.  Lochia and pain wnl.  Denies dizziness, lightheadedness, or sob. No complaints.  Delivery time: 5:23 PM    Magnesium Sulfate received: No BMZ received: No Rhophylac:No MMR:No Transfusion:No  Physical exam  Vitals:   03/20/19 2113 03/20/19 2115 03/21/19 0234 03/21/19 0602  BP: 130/86 (!) 147/93 134/77 131/61  Pulse: 68 75 74 71  Resp: '18 18  18  '$ Temp: 98.4 F (36.9 C) 98.7 F (37.1 C) 98 F (36.7 C) 97.9 F (36.6 C)  TempSrc: Oral Oral  Oral  SpO2: 100% 99% 99% 100%  Weight:      Height:       General: alert, cooperative and no distress Lochia: appropriate Uterine Fundus: firm Incision: Healing well with no significant drainage, No significant erythema,  Dressing is clean, dry, and intact DVT Evaluation: No evidence of DVT seen on physical exam. Negative Homan's sign. No cords or calf tenderness. No significant calf/ankle edema. Labs: Lab Results  Component Value Date   WBC 9.5 03/20/2019   HGB 10.1 (L) 03/20/2019   HCT 28.4 (L) 03/20/2019   MCV 89.6 03/20/2019   PLT 154 03/20/2019   CMP Latest Ref Rng & Units 03/19/2019  Glucose 70 - 99 mg/dL 131(H)  BUN 6 - 20 mg/dL <5(L)  Creatinine 0.44 - 1.00 mg/dL 0.44  Sodium 135 - 145 mmol/L 136  Potassium 3.5 - 5.1 mmol/L 3.5  Chloride 98 - 111 mmol/L 107  CO2 22 - 32 mmol/L 18(L)  Calcium 8.9 - 10.3 mg/dL 8.8(L)  Total Protein 6.5 - 8.1 g/dL 6.1(L)  Total Bilirubin 0.3 - 1.2 mg/dL 0.5  Alkaline Phos 38 - 126 U/L 187(H)  AST 15 - 41 U/L 18  ALT 0 - 44 U/L 11    Discharge instruction: per After Visit Summary and "Baby and Me Booklet".  After visit meds:  Allergies as of 03/21/2019   No Known Allergies     Medication List    STOP taking these medications   Accu-Chek Guide  Me w/Device Kit   Accu-Chek Guide test strip Generic drug: glucose blood   aspirin EC 81 MG tablet   Blood Pressure Monitor Automat Devi   Bonjesta 20-20 MG Tbcr Generic drug: Doxylamine-Pyridoxine ER   cyclobenzaprine 10 MG tablet Commonly known as: FLEXERIL   labetalol 100 MG tablet Commonly known as: NORMODYNE   metFORMIN 500 MG tablet Commonly known as: GLUCOPHAGE   ondansetron 8 MG disintegrating tablet Commonly known as: Zofran ODT   traMADol 50 MG tablet Commonly known as: Ultram     TAKE these medications   DULoxetine 60 MG capsule Commonly known as: CYMBALTA Take 1 capsule (60 mg total) by mouth daily.   ibuprofen 600 MG tablet Commonly known as: ADVIL Take 1 tablet (600 mg total) by mouth every 6 (six) hours as needed for mild pain, moderate pain or cramping.   oxyCODONE 5 MG immediate release tablet Commonly known as: Oxy IR/ROXICODONE Take 1 tablet (5 mg total) by mouth every 4 (four) hours as needed.   prenatal vitamin w/FE, FA 27-1 MG Tabs tablet Take 1 tablet by mouth daily at 12 noon.       Diet: routine diet  Activity: Advance as tolerated. Pelvic rest for 6 weeks.   Outpatient follow up:4 weeks Follow up Appt:No future appointments. Follow up Visit:   Please schedule this patient for Postpartum visit in: 4 weeks with the following provider: Any provider High risk pregnancy complicated by: cHTN and GDMA2 Delivery mode:  SVD Anticipated Birth Control:  BTL done PP PP Procedures needed: GTT and BP check  Schedule Integrated BH visit: no  Newborn Data: Live born female  Birth Weight: 3475g APGAR (1 MIN): 9   APGAR (5 MINS): 9   APGAR (10 MINS):     Newborn Delivery   Birth date/time: 03/20/2019 17:23:00 Delivery type: Vaginal, Spontaneous      Baby Feeding: Breast Disposition:home with mother   03/21/2019 Roma Schanz, CNM

## 2019-03-21 ENCOUNTER — Encounter (HOSPITAL_COMMUNITY): Payer: Self-pay | Admitting: Family Medicine

## 2019-03-21 ENCOUNTER — Inpatient Hospital Stay (HOSPITAL_COMMUNITY): Payer: Medicaid Other

## 2019-03-21 ENCOUNTER — Inpatient Hospital Stay (HOSPITAL_COMMUNITY)
Admission: AD | Admit: 2019-03-21 | Payer: Medicaid Other | Source: Home / Self Care | Admitting: Obstetrics & Gynecology

## 2019-03-21 LAB — GLUCOSE, CAPILLARY
Glucose-Capillary: 113 mg/dL — ABNORMAL HIGH (ref 70–99)
Glucose-Capillary: 95 mg/dL (ref 70–99)
Glucose-Capillary: 78 mg/dL (ref 70–99)

## 2019-03-21 MED ORDER — IBUPROFEN 600 MG PO TABS: 600.0000 mg | ORAL_TABLET | Freq: Four times a day (QID) | ORAL | 0 refills | Status: DC | PRN

## 2019-03-21 MED ORDER — OXYCODONE HCL 5 MG PO TABS: 5.0000 mg | ORAL_TABLET | ORAL | 0 refills | Status: DC | PRN

## 2019-03-21 NOTE — Anesthesia Postprocedure Evaluation (Signed)
Anesthesia Post Note  Patient: [name]  Procedure(s) Performed: POST PARTUM TUBAL LIGATION (N/A )     Patient location during evaluation: Mother Baby Anesthesia Type: Epidural Level of consciousness: awake and alert Pain management: pain level controlled Vital Signs Assessment: post-procedure vital signs reviewed and stable Respiratory status: spontaneous breathing, nonlabored ventilation and respiratory function stable Cardiovascular status: stable Postop Assessment: no headache, no backache and epidural receding Anesthetic complications: no    Last Vitals:  Vitals:   03/21/19 0602 03/21/19 1015  BP: 131/61 132/68  Pulse: 71 72  Resp: 18 17  Temp: 36.6 C 36.7 C  SpO2: 100% 100%    Last Pain:  Vitals:   03/21/19 1015  TempSrc: Oral  PainSc:    Pain Goal:                   [name]

## 2019-03-21 NOTE — Progress Notes (Addendum)
MOB was referred for history of depression/anxiety.  * Referral screened out by Clinical Social Worker because none of the following criteria appear to apply:  ~ History of anxiety/depression during this pregnancy, or of post-partum depression following prior delivery. ~ Diagnosis of anxiety and/or depression within last 3 years. Per chart review, MOB's anxiety/depression date back to 2012. No concerns noted in [facility] records. OR * MOB's symptoms currently being treated with medication and/or therapy. MOB appears to have an active prescription for Cymbalta.  Please contact the Clinical Social Worker if needs arise, by MOB request.  Elijio Miles, [place]  [facility] and [employer] [phone]

## 2019-03-21 NOTE — Lactation Note (Signed)
This note was copied from a baby's chart. Lactation Consultation Note  Patient Name: [name] [id number] Date: 03/21/2019 Reason for consult: Follow-up assessment;Early term 37-38.6wks;Infant weight loss Type of Endocrine Disorder?: Diabetes  Baby is 74 hours old  Per mom the baby fed at 1100 for 15 mins .  While [place] reviewing breastfeeding basics - baby woke up had one wet and when changing the baby had another large wet.  LC offered to assess baby to latch and assist to latch in the football position.  Baby opened wide, mom compressed breast tissue with LC's assist.  Increased swallows noted with breast compressions. Baby still feeding at 10 mins in an active feeding pattern.  [place]  Reviewed sore nipple and engorgement prevention and tx.  Per mom has some pinching with latch. As LC was assisting with mom to latch  LC noted areola edema and recommended the breast shells between feedings except when sleeping to elongate the nipple areola complex for a deeper latch.  LC stressed the importance of obtaining depth every feeding and to use the cross cradle or football position until the baby is latching consistently.  Per mom has a DEBP from the insurance company and didn't feel she needed a hand pump.  Mom aware of the Breast feeding resources after delivery and has the pamphlet with phone number.   [place] praised mom for her efforts breast feeding.   Maternal Data Has patient been taught Hand Expression?: Yes  Feeding Feeding Type: (per mom last fed at 1100 for 15 mins)  LATCH Score                   Interventions Interventions: Breast feeding basics reviewed;Shells  Lactation Tools Discussed/Used Tools: Shells Shell Type: Inverted Pump Review: Milk Storage   Consult Status Consult Status: Complete Date: 03/21/19    Myer Haff 03/21/2019, 11:44 AM

## 2019-03-21 NOTE — Addendum Note (Signed)
Addendum  created 03/21/19 0002 by Keiston Manley, Waynard Reeds, CRNA   Charge Capture section accepted

## 2019-03-21 NOTE — Discharge Instructions (Signed)
Call the office [phone]) or go to [facility] hospital for these signs of pre-eclampsia:  Severe headache that does not go away with Tylenol  Visual changes- seeing spots, double, blurred vision  Pain under your right breast or upper abdomen that does not go away with Tums or heartburn medicine  Nausea and/or vomiting  Severe swelling in your hands, feet, and face   Check your blood pressure 4 times daily and keep a log, if >=140 on top or >= 90 on the bottom call office and let us know.  If blood pressure is >=160 on top or >=110 on bottom, check again in 5 minutes, if still this high, call us or go to [facility] to be evaluated    Postpartum Care After Vaginal Delivery This sheet gives you information about how to care for yourself from the time you deliver your baby to up to 6-12 weeks after delivery (postpartum period). Your health care provider may also give you more specific instructions. If you have problems or questions, contact your health care provider. Follow these instructions at home: Vaginal bleeding  It is normal to have vaginal bleeding (lochia) after delivery. Wear a sanitary pad for vaginal bleeding and discharge. ? During the first week after delivery, the amount and appearance of lochia is often similar to a menstrual period. ? Over the next few weeks, it will gradually decrease to a dry, yellow-brown discharge. ? For most women, lochia stops completely by 4-6 weeks after delivery. Vaginal bleeding can vary from woman to woman.  Change your sanitary pads frequently. Watch for any changes in your flow, such as: ? A sudden increase in volume. ? A change in color. ? Large blood clots.  If you pass a blood clot from your vagina, save it and call your health care provider to discuss. Do not flush blood clots down the toilet before talking with your health care provider.  Do not use tampons or douches until your health care provider says this is safe.  If you are  not breastfeeding, your period should return 6-8 weeks after delivery. If you are feeding your child breast milk only (exclusive breastfeeding), your period may not return until you stop breastfeeding. Perineal care  Keep the area between the vagina and the anus (perineum) clean and dry as told by your health care provider. Use medicated pads and pain-relieving sprays and creams as directed.  If you had a cut in the perineum (episiotomy) or a tear in the vagina, check the area for signs of infection until you are healed. Check for: ? More redness, swelling, or pain. ? Fluid or blood coming from the cut or tear. ? Warmth. ? Pus or a bad smell.  You may be given a squirt bottle to use instead of wiping to clean the perineum area after you go to the bathroom. As you start healing, you may use the squirt bottle before wiping yourself. Make sure to wipe gently.  To relieve pain caused by an episiotomy, a tear in the vagina, or swollen veins in the anus (hemorrhoids), try taking a warm sitz bath 2-3 times a day. A sitz bath is a warm water bath that is taken while you are sitting down. The water should only come up to your hips and should cover your buttocks. Breast care  Within the first few days after delivery, your breasts may feel heavy, full, and uncomfortable (breast engorgement). Milk may also leak from your breasts. Your health care provider can suggest  ways to help relieve the discomfort. Breast engorgement should go away within a few days.  If you are breastfeeding: ? Wear a bra that supports your breasts and fits you well. ? Keep your nipples clean and dry. Apply creams and ointments as told by your health care provider. ? You may need to use breast pads to absorb milk that leaks from your breasts. ? You may have uterine contractions every time you breastfeed for up to several weeks after delivery. Uterine contractions help your uterus return to its normal size. ? If you have any problems  with breastfeeding, work with your health care provider or Advertising copywriter.  If you are not breastfeeding: ? Avoid touching your breasts a lot. Doing this can make your breasts produce more milk. ? Wear a good-fitting bra and use cold packs to help with swelling. ? Do not squeeze out (express) milk. This causes you to make more milk. Intimacy and sexuality  Ask your health care provider when you can engage in sexual activity. This may depend on: ? Your risk of infection. ? How fast you are healing. ? Your comfort and desire to engage in sexual activity.  You are able to get pregnant after delivery, even if you have not had your period. If desired, talk with your health care provider about methods of birth control (contraception). Medicines  Take over-the-counter and prescription medicines only as told by your health care provider.  If you were prescribed an antibiotic medicine, take it as told by your health care provider. Do not stop taking the antibiotic even if you start to feel better. Activity  Gradually return to your normal activities as told by your health care provider. Ask your health care provider what activities are safe for you.  Rest as much as possible. Try to rest or take a nap while your baby is sleeping. Eating and drinking   Drink enough fluid to keep your urine pale yellow.  Eat high-fiber foods every day. These may help prevent or relieve constipation. High-fiber foods include: ? Whole grain cereals and breads. ? Brown rice. ? Beans. ? Fresh fruits and vegetables.  Do not try to lose weight quickly by cutting back on calories.  Take your prenatal vitamins until your postpartum checkup or until your health care provider tells you it is okay to stop. Lifestyle  Do not use any products that contain nicotine or tobacco, such as cigarettes and e-cigarettes. If you need help quitting, ask your health care provider.  Do not drink alcohol, especially if you  are breastfeeding. General instructions  Keep all follow-up visits for you and your baby as told by your health care provider. Most women visit their health care provider for a postpartum checkup within the first 3-6 weeks after delivery. Contact a health care provider if:  You feel unable to cope with the changes that your child brings to your life, and these feelings do not go away.  You feel unusually sad or worried.  Your breasts become red, painful, or hard.  You have a fever.  You have trouble holding urine or keeping urine from leaking.  You have little or no interest in activities you used to enjoy.  You have not breastfed at all and you have not had a menstrual period for 12 weeks after delivery.  You have stopped breastfeeding and you have not had a menstrual period for 12 weeks after you stopped breastfeeding.  You have questions about caring for yourself or your  baby.  You pass a blood clot from your vagina. Get help right away if:  You have chest pain.  You have difficulty breathing.  You have sudden, severe leg pain.  You have severe pain or cramping in your lower abdomen.  You bleed from your vagina so much that you fill more than one sanitary pad in one hour. Bleeding should not be heavier than your heaviest period.  You develop a severe headache.  You faint.  You have blurred vision or spots in your vision.  You have bad-smelling vaginal discharge.  You have thoughts about hurting yourself or your baby. If you ever feel like you may hurt yourself or others, or have thoughts about taking your own life, get help right away. You can go to the nearest emergency department or call:  Your local emergency services (911 in the [place]  A suicide crisis helpline, such as the National Suicide Prevention Lifeline at (913)137-2156. This is open 24 hours a day. Summary  The period of time right after you deliver your newborn up to 6-12 weeks after delivery is  called the postpartum period.  Gradually return to your normal activities as told by your health care provider.  Keep all follow-up visits for you and your baby as told by your health care provider. This information is not intended to replace advice given to you by your health care provider. Make sure you discuss any questions you have with your health care provider. Document Released: 03/28/2007 Document Revised: 06/03/2017 Document Reviewed: 03/14/2017 Elsevier Patient Education  2020 [employer]   Laparoscopic Tubal Ligation, Care After This sheet gives you information about how to care for yourself after your procedure. Your health care provider may also give you more specific instructions. If you have problems or questions, contact your health care provider. What can I expect after the procedure? After the procedure, it is common to have:  A sore throat.  Discomfort in your shoulder.  Mild discomfort or cramping in your abdomen.  Gas pains.  Pain or soreness in the area where the surgical incision was made.  A bloated feeling.  Tiredness.  Nausea.  Vomiting. Follow these instructions at home: Medicines  Take over-the-counter and prescription medicines only as told by your health care provider.  Do not take aspirin because it can cause bleeding.  Ask your health care provider if the medicine prescribed to you: ? Requires you to avoid driving or using heavy machinery. ? Can cause constipation. You may need to take actions to prevent or treat constipation, such as:  Drink enough fluid to keep your urine pale yellow.  Take over-the-counter or prescription medicines.  Eat foods that are high in fiber, such as beans, whole grains, and fresh fruits and vegetables.  Limit foods that are high in fat and processed sugars, such as fried or sweet foods. Incision care      Follow instructions from your health care provider about how to take care of your incision.  Make sure you: ? Wash your hands with soap and water before and after you change your bandage (dressing). If soap and water are not available, use hand sanitizer. ? Change your dressing as told by your health care provider. ? Leave stitches (sutures), skin glue, or adhesive strips in place. These skin closures may need to stay in place for 2 weeks or longer. If adhesive strip edges start to loosen and curl up, you may trim the loose edges. Do not remove adhesive strips completely  unless your health care provider tells you to do that.  Check your incision area every day for signs of infection. Check for: ? Redness, swelling, or pain. ? Fluid or blood. ? Warmth. ? Pus or a bad smell. Activity  Rest as told by your health care provider.  Avoid sitting for a long time without moving. Get up to take short walks every 1-2 hours. This is important to improve blood flow and breathing. Ask for help if you feel weak or unsteady.  Return to your normal activities as told by your health care provider. Ask your health care provider what activities are safe for you. General instructions  Do not take baths, swim, or use a hot tub until your health care provider approves. Ask your health care provider if you may take showers. You may only be allowed to take sponge baths.  Have someone help you with your daily household tasks for the first few days.  Keep all follow-up visits as told by your health care provider. This is important. Contact a health care provider if:  You have redness, swelling, or pain around your incision.  Your incision feels warm to the touch.  You have pus or a bad smell coming from your incision.  The edges of your incision break open after the sutures have been removed.  Your pain does not improve after 2-3 days.  You have a rash.  You repeatedly become dizzy or light-headed.  Your pain medicine is not helping. Get help right away if you:  Have a  fever.  Faint.  Have increasing pain in your abdomen.  Have severe pain in one or both of your shoulders.  Have fluid or blood coming from your sutures or from your vagina.  Have shortness of breath or difficulty breathing.  Have chest pain or leg pain.  Have ongoing nausea, vomiting, or diarrhea. Summary  After the procedure, it is common to have mild discomfort or cramping in your abdomen.  Take over-the-counter and prescription medicines only as told by your health care provider.  Watch for symptoms that should prompt you to call your health care provider.  Keep all follow-up visits as told by your health care provider. This is important. This information is not intended to replace advice given to you by your health care provider. Make sure you discuss any questions you have with your health care provider. Document Released: 12/18/2004 Document Revised: 04/25/2018 Document Reviewed: 04/25/2018 Elsevier Patient Education  2020 [employer].    Breastfeeding  Choosing to breastfeed is one of the best decisions you can make for yourself and your baby. A change in hormones during pregnancy causes your breasts to make breast milk in your milk-producing glands. Hormones prevent breast milk from being released before your baby is born. They also prompt milk flow after birth. Once breastfeeding has begun, thoughts of your baby, as well as his or her sucking or crying, can stimulate the release of milk from your milk-producing glands. Benefits of breastfeeding Research shows that breastfeeding offers many health benefits for infants and mothers. It also offers a cost-free and convenient way to feed your baby. For your baby  Your first milk (colostrum) helps your baby's digestive system to function better.  Special cells in your milk (antibodies) help your baby to fight off infections.  Breastfed babies are less likely to develop asthma, allergies, obesity, or type 2 diabetes. They  are also at lower risk for sudden infant death syndrome (SIDS).  Nutrients in  breast milk are better able to meet your babys needs compared to infant formula.  Breast milk improves your baby's brain development. For you  Breastfeeding helps to create a very special bond between you and your baby.  Breastfeeding is convenient. Breast milk costs nothing and is always available at the correct temperature.  Breastfeeding helps to burn calories. It helps you to lose the weight that you gained during pregnancy.  Breastfeeding makes your uterus return faster to its size before pregnancy. It also slows bleeding (lochia) after you give birth.  Breastfeeding helps to lower your risk of developing type 2 diabetes, osteoporosis, rheumatoid arthritis, cardiovascular disease, and breast, ovarian, uterine, and endometrial cancer later in life. Breastfeeding basics Starting breastfeeding  Find a comfortable place to sit or lie down, with your neck and back well-supported.  Place a pillow or a rolled-up blanket under your baby to bring him or her to the level of your breast (if you are seated). Nursing pillows are specially designed to help support your arms and your baby while you breastfeed.  Make sure that your baby's tummy (abdomen) is facing your abdomen.  Gently massage your breast. With your fingertips, massage from the outer edges of your breast inward toward the nipple. This encourages milk flow. If your milk flows slowly, you may need to continue this action during the feeding.  Support your breast with 4 fingers underneath and your thumb above your nipple (make the letter "C" with your hand). Make sure your fingers are well away from your nipple and your babys mouth.  Stroke your baby's lips gently with your finger or nipple.  When your baby's mouth is open wide enough, quickly bring your baby to your breast, placing your entire nipple and as much of the areola as possible into your baby's  mouth. The areola is the colored area around your nipple. ? More areola should be visible above your baby's upper lip than below the lower lip. ? Your baby's lips should be opened and extended outward (flanged) to ensure an adequate, comfortable latch. ? Your baby's tongue should be between his or her lower gum and your breast.  Make sure that your baby's mouth is correctly positioned around your nipple (latched). Your baby's lips should create a seal on your breast and be turned out (everted).  It is common for your baby to suck about 2-3 minutes in order to start the flow of breast milk. Latching Teaching your baby how to latch onto your breast properly is very important. An improper latch can cause nipple pain, decreased milk supply, and poor weight gain in your baby. Also, if your baby is not latched onto your nipple properly, he or she may swallow some air during feeding. This can make your baby fussy. Burping your baby when you switch breasts during the feeding can help to get rid of the air. However, teaching your baby to latch on properly is still the best way to prevent fussiness from swallowing air while breastfeeding. Signs that your baby has successfully latched onto your nipple  Silent tugging or silent sucking, without causing you pain. Infant's lips should be extended outward (flanged).  Swallowing heard between every 3-4 sucks once your milk has started to flow (after your let-down milk reflex occurs).  Muscle movement above and in front of his or her ears while sucking. Signs that your baby has not successfully latched onto your nipple  Sucking sounds or smacking sounds from your baby while breastfeeding.  Nipple  pain. If you think your baby has not latched on correctly, slip your finger into the corner of your babys mouth to break the suction and place it between your baby's gums. Attempt to start breastfeeding again. Signs of successful breastfeeding Signs from your  baby  Your baby will gradually decrease the number of sucks or will completely stop sucking.  Your baby will fall asleep.  Your baby's body will relax.  Your baby will retain a small amount of milk in his or her mouth.  Your baby will let go of your breast by himself or herself. Signs from you  Breasts that have increased in firmness, weight, and size 1-3 hours after feeding.  Breasts that are softer immediately after breastfeeding.  Increased milk volume, as well as a change in milk consistency and color by the fifth day of breastfeeding.  Nipples that are not sore, cracked, or bleeding. Signs that your baby is getting enough milk  Wetting at least 1-2 diapers during the first 24 hours after birth.  Wetting at least 5-6 diapers every 24 hours for the first week after birth. The urine should be clear or pale yellow by the age of 5 days.  Wetting 6-8 diapers every 24 hours as your baby continues to grow and develop.  At least 3 stools in a 24-hour period by the age of 5 days. The stool should be soft and yellow.  At least 3 stools in a 24-hour period by the age of 7 days. The stool should be seedy and yellow.  No loss of weight greater than 10% of birth weight during the first 3 days of life.  Average weight gain of 4-7 oz (113-198 g) per week after the age of 4 days.  Consistent daily weight gain by the age of 5 days, without weight loss after the age of 2 weeks. After a feeding, your baby may spit up a small amount of milk. This is normal. Breastfeeding frequency and duration Frequent feeding will help you make more milk and can prevent sore nipples and extremely full breasts (breast engorgement). Breastfeed when you feel the need to reduce the fullness of your breasts or when your baby shows signs of hunger. This is called "breastfeeding on demand." Signs that your baby is hungry include:  Increased alertness, activity, or restlessness.  Movement of the head from side to  side.  Opening of the mouth when the corner of the mouth or cheek is stroked (rooting).  Increased sucking sounds, smacking lips, cooing, sighing, or squeaking.  Hand-to-mouth movements and sucking on fingers or hands.  Fussing or crying. Avoid introducing a pacifier to your baby in the first 4-6 weeks after your baby is born. After this time, you may choose to use a pacifier. Research has shown that pacifier use during the first year of a baby's life decreases the risk of sudden infant death syndrome (SIDS). Allow your baby to feed on each breast as long as he or she wants. When your baby unlatches or falls asleep while feeding from the first breast, offer the second breast. Because newborns are often sleepy in the first few weeks of life, you may need to awaken your baby to get him or her to feed. Breastfeeding times will vary from baby to baby. However, the following rules can serve as a guide to help you make sure that your baby is properly fed:  Newborns (babies 31 weeks of age or younger) may breastfeed every 1-3 hours.  Newborns should  not go without breastfeeding for longer than 3 hours during the day or 5 hours during the night.  You should breastfeed your baby a minimum of 8 times in a 24-hour period. Breast milk pumping     Pumping and storing breast milk allows you to make sure that your baby is exclusively fed your breast milk, even at times when you are unable to breastfeed. This is especially important if you go back to work while you are still breastfeeding, or if you are not able to be present during feedings. Your lactation consultant can help you find a method of pumping that works best for you and give you guidelines about how long it is safe to store breast milk. Caring for your breasts while you breastfeed Nipples can become dry, cracked, and sore while breastfeeding. The following recommendations can help keep your breasts moisturized and healthy:  Avoid using soap on  your nipples.  Wear a supportive bra designed especially for nursing. Avoid wearing underwire-style bras or extremely tight bras (sports bras).  Air-dry your nipples for 3-4 minutes after each feeding.  Use only cotton bra pads to absorb leaked breast milk. Leaking of breast milk between feedings is normal.  Use lanolin on your nipples after breastfeeding. Lanolin helps to maintain your skin's normal moisture barrier. Pure lanolin is not harmful (not toxic) to your baby. You may also hand express a few drops of breast milk and gently massage that milk into your nipples and allow the milk to air-dry. In the first few weeks after giving birth, some women experience breast engorgement. Engorgement can make your breasts feel heavy, warm, and tender to the touch. Engorgement peaks within 3-5 days after you give birth. The following recommendations can help to ease engorgement:  Completely empty your breasts while breastfeeding or pumping. You may want to start by applying warm, moist heat (in the shower or with warm, water-soaked hand towels) just before feeding or pumping. This increases circulation and helps the milk flow. If your baby does not completely empty your breasts while breastfeeding, pump any extra milk after he or she is finished.  Apply ice packs to your breasts immediately after breastfeeding or pumping, unless this is too uncomfortable for you. To do this: ? Put ice in a plastic bag. ? Place a towel between your skin and the bag. ? Leave the ice on for 20 minutes, 2-3 times a day.  Make sure that your baby is latched on and positioned properly while breastfeeding. If engorgement persists after 48 hours of following these recommendations, contact your health care provider or a Advertising copywriter. Overall health care recommendations while breastfeeding  Eat 3 healthy meals and 3 snacks every day. Well-nourished mothers who are breastfeeding need an additional 450-500 calories a day.  You can meet this requirement by increasing the amount of a balanced diet that you eat.  Drink enough water to keep your urine pale yellow or clear.  Rest often, relax, and continue to take your prenatal vitamins to prevent fatigue, stress, and low vitamin and mineral levels in your body (nutrient deficiencies).  Do not use any products that contain nicotine or tobacco, such as cigarettes and e-cigarettes. Your baby may be harmed by chemicals from cigarettes that pass into breast milk and exposure to secondhand smoke. If you need help quitting, ask your health care provider.  Avoid alcohol.  Do not use illegal drugs or marijuana.  Talk with your health care provider before taking any medicines. These include  over-the-counter and prescription medicines as well as vitamins and herbal supplements. Some medicines that may be harmful to your baby can pass through breast milk.  It is possible to become pregnant while breastfeeding. If birth control is desired, ask your health care provider about options that will be safe while breastfeeding your baby. Where to find more information: [employer]: www.llli.org Contact a health care provider if:  You feel like you want to stop breastfeeding or have become frustrated with breastfeeding.  Your nipples are cracked or bleeding.  Your breasts are red, tender, or warm.  You have: ? Painful breasts or nipples. ? A swollen area on either breast. ? A fever or chills. ? Nausea or vomiting. ? Drainage other than breast milk from your nipples.  Your breasts do not become full before feedings by the fifth day after you give birth.  You feel sad and depressed.  Your baby is: ? Too sleepy to eat well. ? Having trouble sleeping. ? More than 531 week old and wetting fewer than 6 diapers in a 24-hour period. ? Not gaining weight by 15 days of age.  Your baby has fewer than 3 stools in a 24-hour period.  Your baby's skin or the white  parts of his or her eyes become yellow. Get help right away if:  Your baby is overly tired (lethargic) and does not want to wake up and feed.  Your baby develops an unexplained fever. Summary  Breastfeeding offers many health benefits for infant and mothers.  Try to breastfeed your infant when he or she shows early signs of hunger.  Gently tickle or stroke your baby's lips with your finger or nipple to allow the baby to open his or her mouth. Bring the baby to your breast. Make sure that much of the areola is in your baby's mouth. Offer one side and burp the baby before you offer the other side.  Talk with your health care provider or lactation consultant if you have questions or you face problems as you breastfeed. This information is not intended to replace advice given to you by your health care provider. Make sure you discuss any questions you have with your health care provider. Document Released: 05/31/2005 Document Revised: 08/25/2017 Document Reviewed: 07/02/2016 Elsevier Patient Education  2020 [employer]

## 2019-03-21 NOTE — Addendum Note (Signed)
Addendum  created 03/21/19 1050 by Duane Boston, MD   Intraprocedure Staff edited

## 2019-03-21 NOTE — Addendum Note (Signed)
Addendum  created 03/21/19 1141 by Ignacia Bayley, CRNA   Clinical Note Signed

## 2019-03-22 ENCOUNTER — Telehealth: Payer: Self-pay | Admitting: *Deleted

## 2019-03-22 ENCOUNTER — Other Ambulatory Visit: Payer: BC Managed Care – PPO

## 2019-03-22 ENCOUNTER — Encounter: Payer: BC Managed Care – PPO | Admitting: Advanced Practice Midwife

## 2019-03-22 LAB — SURGICAL PATHOLOGY

## 2019-03-22 NOTE — Addendum Note (Signed)
Addendum  created 03/22/19 0820 by Duane Boston, MD   Intraprocedure Event edited

## 2019-03-22 NOTE — Telephone Encounter (Signed)
Patient states she had a tubal on 10/6 and wants to know when she can remove the bandage.

## 2019-03-22 NOTE — Telephone Encounter (Signed)
Patient states she has a honeycomb type dressing over her tubal incision and it looks to have dried dark blood on it.  Advised patient to take a shower and remove the dressing, pat dry and place a band aid over it if needed.

## 2019-03-23 ENCOUNTER — Encounter: Payer: Self-pay | Admitting: *Deleted

## 2019-03-23 ENCOUNTER — Telehealth (INDEPENDENT_AMBULATORY_CARE_PROVIDER_SITE_OTHER): Payer: BC Managed Care – PPO | Admitting: *Deleted

## 2019-03-23 ENCOUNTER — Other Ambulatory Visit: Payer: Self-pay

## 2019-03-23 VITALS — BP 146/84 | HR 91 | Ht 67.0 in | Wt 240.0 lb

## 2019-03-23 DIAGNOSIS — Z0130 Encounter for examination of blood pressure without abnormal findings: Secondary | ICD-10-CM

## 2019-03-23 NOTE — Progress Notes (Signed)
   NURSE VISIT- BLOOD PRESSURE CHECK  SUBJECTIVE:  [name] is a 23 y.o. [phone] female here for BP check. She is postpartum, delivery date 10/6    HYPERTENSION ROS:  Pregnant/postpartum:  . Severe headaches that don't go away with tylenol/other medicines: No  . Visual changes (seeing spots/double/blurred vision) No  . Severe pain under right breast breast or in center of upper chest No  . Severe nausea/vomiting No  . Taking medicines as instructed not applicable  OBJECTIVE:  BP (!) 146/84 (BP Location: Left Arm, Patient Position: Sitting)   Pulse 91   Ht 5\' 7"  (1.702 m)   Wt 240 lb (108.9 kg)   LMP 09/05/2017   Breastfeeding Yes   BMI 37.59 kg/m   Appearance alert, well appearing, and in no distress.  ASSESSMENT: Postpartum  blood pressure check  PLAN: Discussed with Dr. Elonda Husky   Recommendations: no changes needed   Follow-up: as scheduled   Levy Pupa  03/23/2019 12:08 PM

## 2019-03-30 ENCOUNTER — Encounter: Payer: Self-pay | Admitting: Advanced Practice Midwife

## 2019-04-02 ENCOUNTER — Other Ambulatory Visit: Payer: Self-pay | Admitting: Women's Health

## 2019-04-02 NOTE — Progress Notes (Signed)
Erroneous encounter.  Pt saw provider as this was not just a nurse visit.

## 2019-04-04 ENCOUNTER — Telehealth: Payer: Self-pay | Admitting: *Deleted

## 2019-04-04 NOTE — Telephone Encounter (Signed)
Left message @ 1:45 pm. JSY ?

## 2019-04-04 NOTE — Telephone Encounter (Signed)
Pt delivered 2 weeks ago. Having to change pad every 30 minutes to 1 hour. Pt is wearing urinary incontience pads. Pt had bleeding issues in the hospital. + headache. BP has been in the 130's/80's. I advised pt she needs to go to [facility] for eval due to all the bleeding. Pt advised she would go today. [facility]

## 2019-04-04 NOTE — Telephone Encounter (Signed)
Pt left message that she is bleeding. She wants to know how much bleeding is normal when you are 2 weeks postpartum.

## 2019-04-09 NOTE — Addendum Note (Signed)
Addendum  created 04/09/19 1143 by Donnella Bi, RN   Intraprocedure Staff edited

## 2019-04-11 IMAGING — US US PELVIS COMPLETE
1 series · 14 of 25 positions shown · non-contrast
Comparison: None.

CLINICAL DATA: Bleeding after spontaneous vaginal delivery.

EXAM:
TRANSABDOMINAL ULTRASOUND OF PELVIS
TECHNIQUE: Transabdominal ultrasound examination of the pelvis was performed
including evaluation of the uterus, ovaries, adnexal regions, and
pelvic cul-de-sac.

[Series 1: us pelvis complete · 0.27mm/px · 14 of 51 slices shown]
[im 1/51]
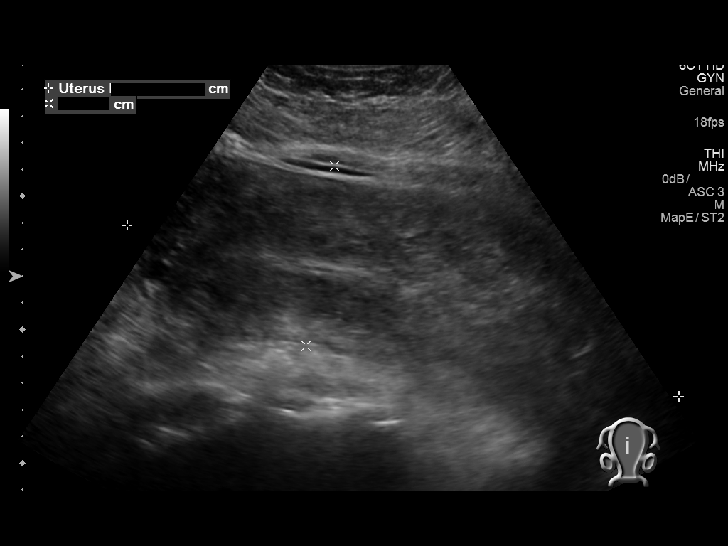
[im 5/51]
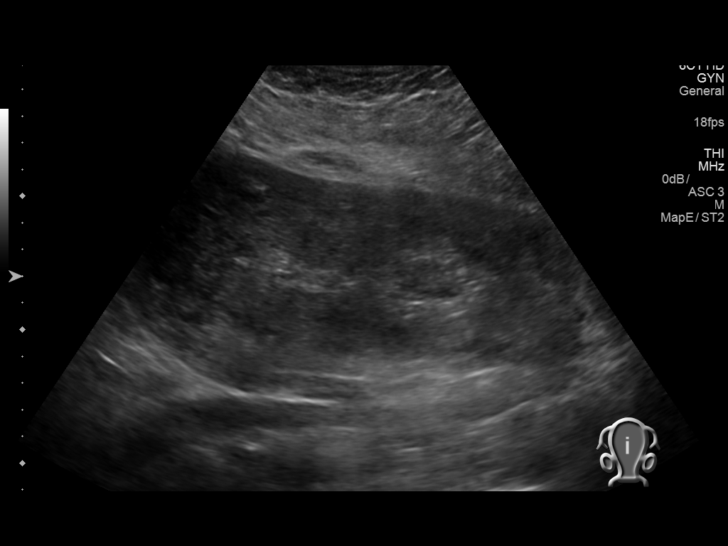
[im 9/51]
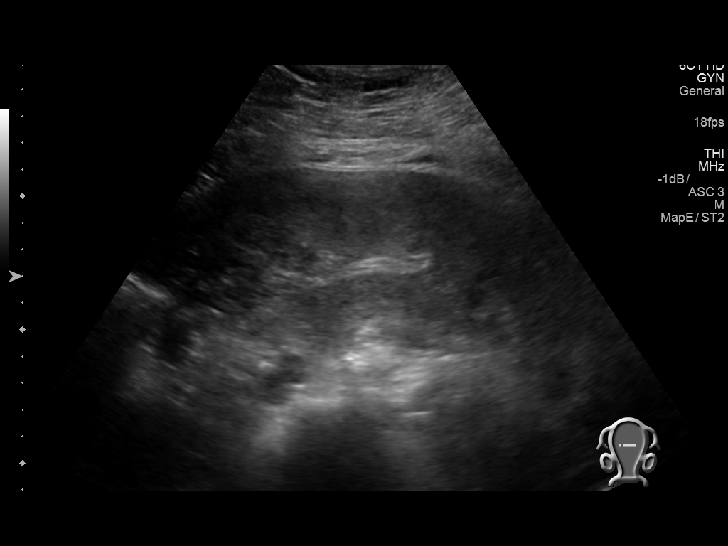
[im 13/51]
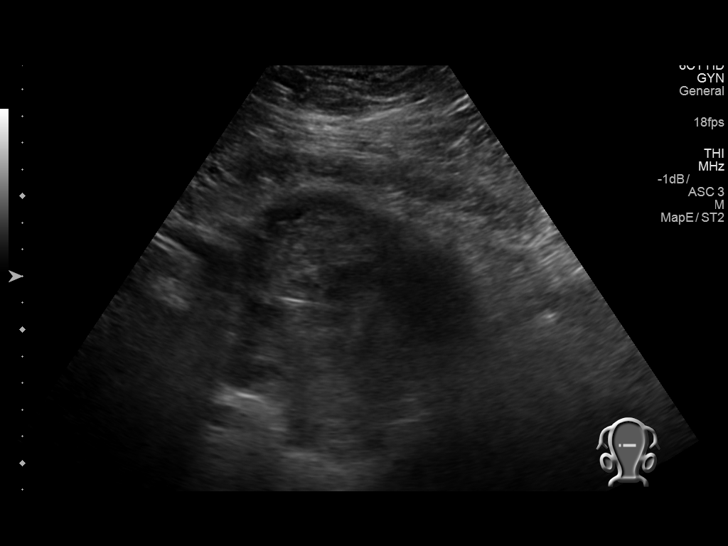
[im 17/51]
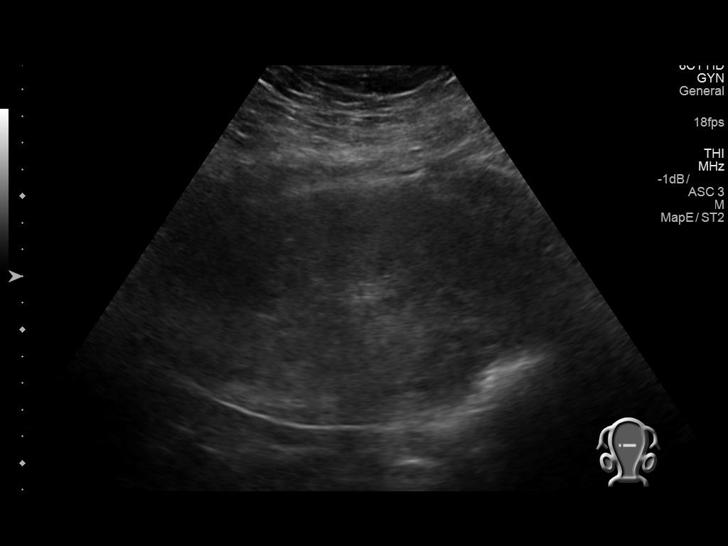
[im 19/51]
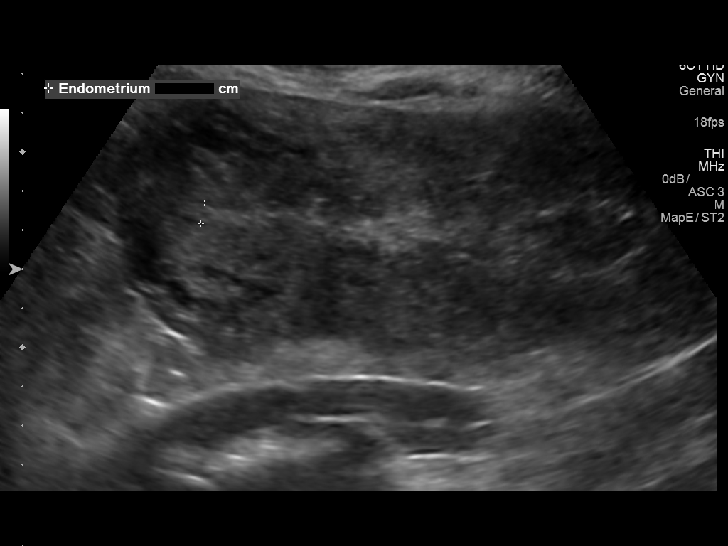
[im 23/51]
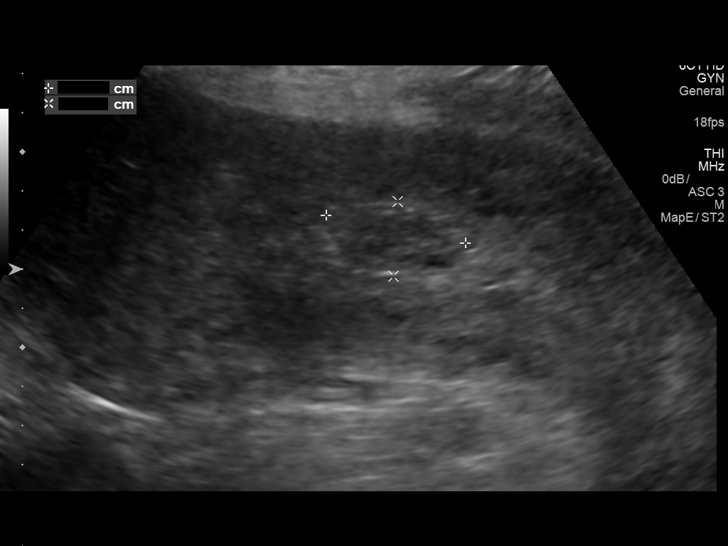
[im 28/51]
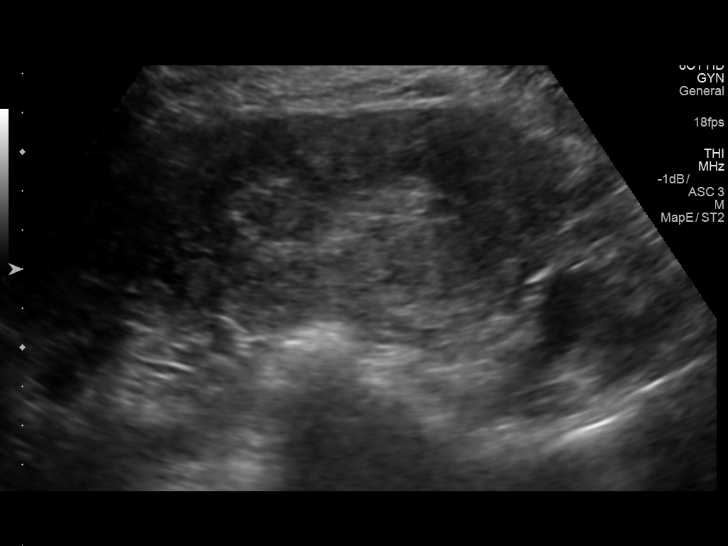
[im 32/51]
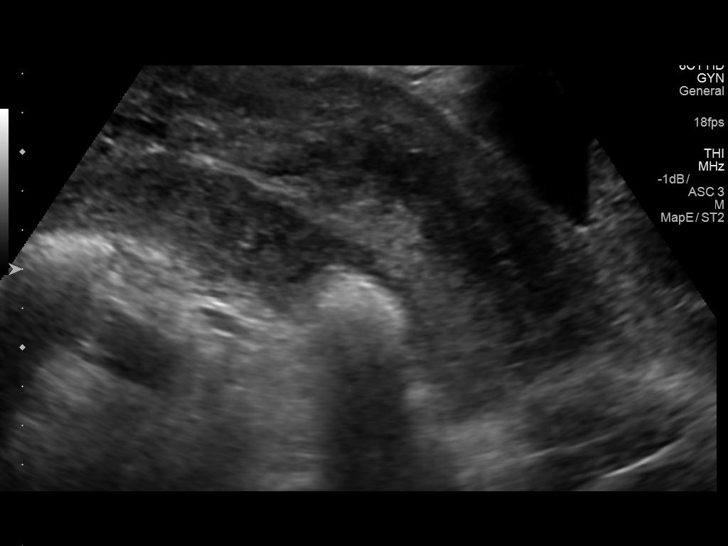
[im 34/51]
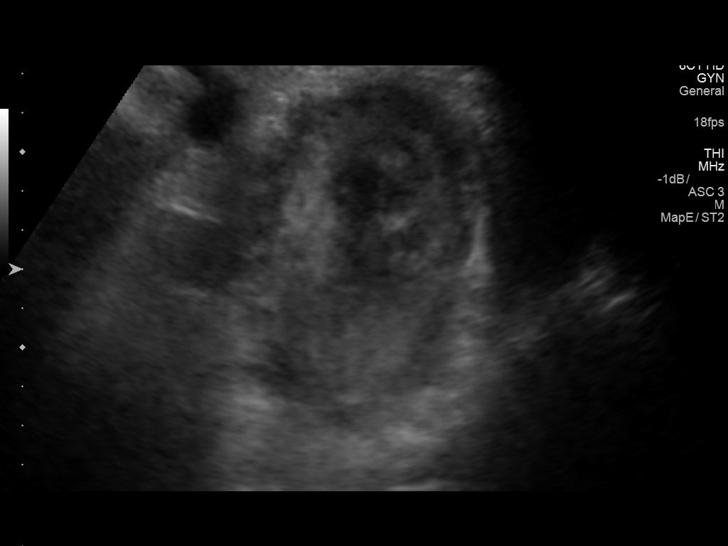
[im 38/51]
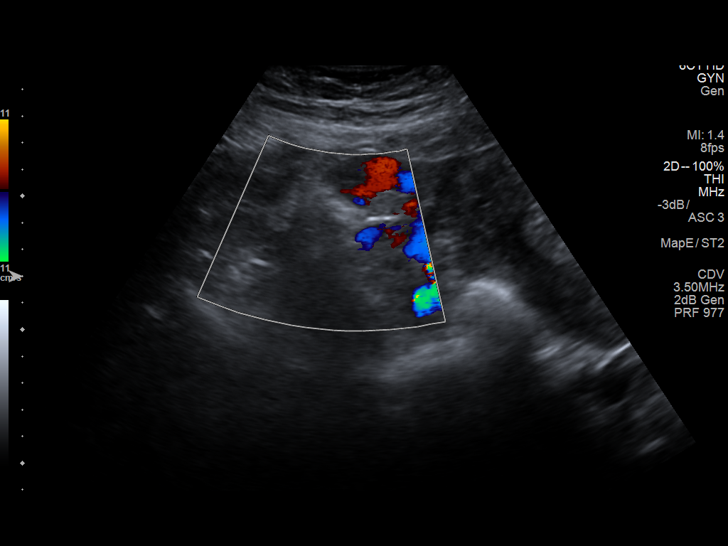
[im 42/51]
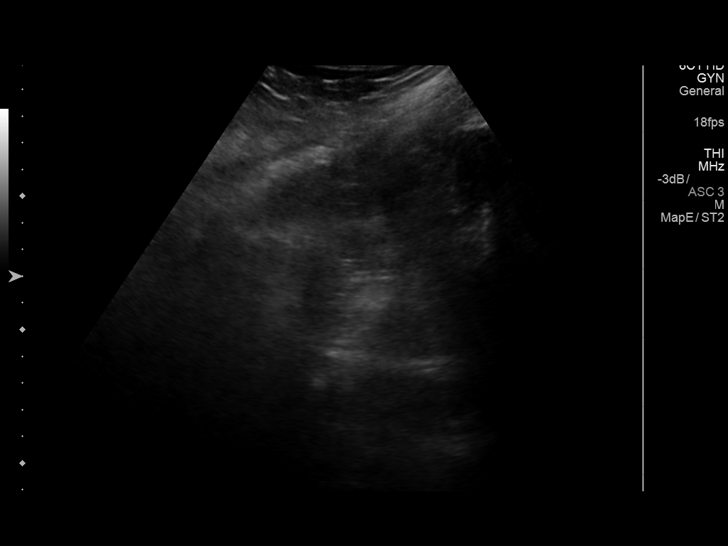
[im 46/51]
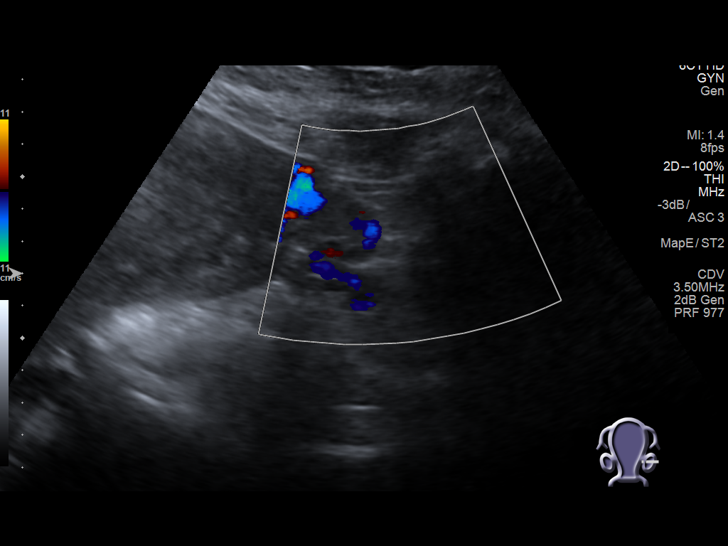
[im 51/51]
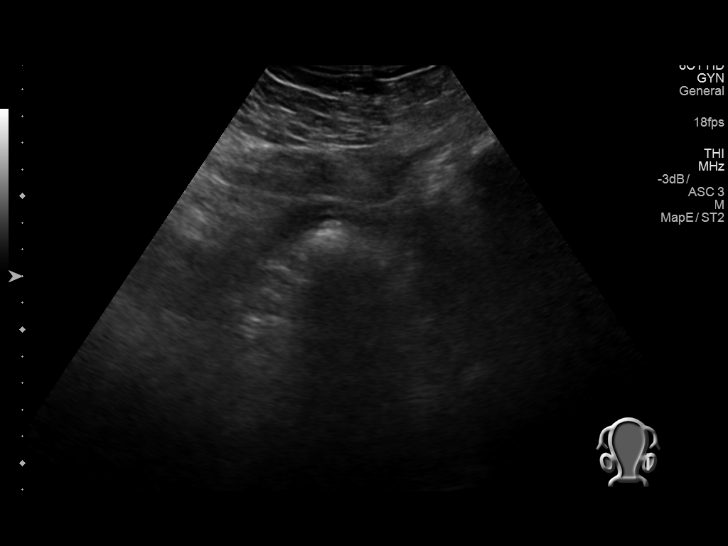

[14 of 25 positions shown; findings below may reference images not displayed]

FINDINGS: Uterus

Measurements: 22 x 6.8 x 14 cm. No fibroids or other mass
visualized.

Endometrium

Thickness: 5 mm. A nodular masslike region of thickening in the
endometrial canal of the lower uterine segment measures 3.6 x 1.9 x
3.6 cm. There is adjacent blood flow but no definitive internal
blood flow in this region.

Right ovary

Measurements: 6.6 x 2.4 x 4.4 cm. Normal appearance/no adnexal mass.

Left ovary

Measurements: 3.1 x 3.8 x 2.6 cm. Normal appearance/no adnexal mass.

Other findings:  No abnormal free fluid.
IMPRESSION: 1. There is an oval nodular collection in the endometrial canal of
the lower uterine segment, extending towards the cervix measuring
3.6 x 1.9 x 3.6 cm with no definitive internal blood flow. While
this could represent a clot given history of bleeding, retained
products of conception are not excluded in the correct clinical
setting.

## 2019-04-26 ENCOUNTER — Telehealth (INDEPENDENT_AMBULATORY_CARE_PROVIDER_SITE_OTHER): Payer: BC Managed Care – PPO | Admitting: Advanced Practice Midwife

## 2019-04-26 ENCOUNTER — Other Ambulatory Visit: Payer: Self-pay

## 2019-04-26 ENCOUNTER — Encounter: Payer: Self-pay | Admitting: Advanced Practice Midwife

## 2019-04-26 DIAGNOSIS — Z1389 Encounter for screening for other disorder: Secondary | ICD-10-CM

## 2019-04-26 NOTE — Progress Notes (Addendum)
TELEHEALTH VIRTUAL POSTPARTUM VISIT ENCOUNTER NOTE  I connected with@ on 04/26/19 at  1:30 PM EST by telephone at home and verified that I am speaking with the correct person using two identifiers.   I discussed the limitations, risks, security and privacy concerns of performing an evaluation and management service by telephone and the availability of in person appointments. I also discussed with the patient that there may be a patient responsible charge related to this service. The patient expressed understanding and agreed to proceed.  Appointment Date: 04/26/2019  OBGYN Clinic: [facility]  Chief Complaint:  Postpartum Visit  History of Present Illness: CADI RHINEHART is a 23 y.o.  [phone] (No LMP recorded.), seen for the above chief complaint. Her past medical history is significant for [facility]   She is s/p normal spontaneous vaginal delivery on 10/6at 39 weeks; she was discharged to home on PPD#2. Pregnancy complicated by X5QM.(metformin). Baby is doing well.  Complains of thick vaginal discharge  Vaginal bleeding or discharge: some intermittent thick, yellow. not itch/irritation. SOunds like lociha abla Mode of feeding infant: had been breast and bottle, now just bottle Intercourse: Yes  Contraception: bilateral tubal ligation PP depression s/s: plans to get back on cymbalta w/Psychiatrist.  Any bowel or bladder issues: No  Pap smear: ASCUS with POSITIVE high risk HPV (date: 1/19)  Review of Systems: JHas lost 47 Lbs, doing low carb . Her 12 point review of systems is negative or as noted in the History of Present Illness.  Patient Active Problem List   Diagnosis Date Noted  . [redacted] weeks gestation of pregnancy 03/20/2019  . Chronic hypertension affecting pregnancy 03/20/2019  . GDM, class A2 03/19/2019  . Gestational diabetes mellitus, class A2 12/25/2018  . PTSD (post-traumatic stress disorder) 03/30/2018  . MDD (major depressive disorder), recurrent episode, moderate  ([place]) 03/30/2018  . Multiple acquired skin tags 02/18/2017  . Chronic hypertension in pregnancy 01/24/2017  . Abnormal EKG 01/02/2017  . Tachycardia 01/02/2017  . Abnormal Pap smear of cervix 07/26/2016  . Rh negative state in antepartum period 07/20/2016  . Smoker 05/07/2013    Medications Dannya R. Lookabaugh had no medications administered during this visit. Current Outpatient Medications  Medication Sig Dispense Refill  . DULoxetine (CYMBALTA) 60 MG capsule Take 1 capsule (60 mg total) by mouth daily. (Patient not taking: Reported on 04/26/2019) 90 capsule 0  . ibuprofen (ADVIL) 600 MG tablet Take 1 tablet (600 mg total) by mouth every 6 (six) hours as needed for mild pain, moderate pain or cramping. (Patient not taking: Reported on 04/26/2019) 30 tablet 0   No current facility-administered medications for this visit.     Allergies Patient has no known allergies.  Physical Exam:  General:  Alert, oriented and cooperative.   Mental Status: Normal mood and affect perceived. Normal judgment and thought content.  Rest of physical exam deferred due to type of encounter  PP Depression Screening:   Edinburgh Postnatal Depression Scale - 04/26/19 1335      Edinburgh Postnatal Depression Scale:  In the Past 7 Days   I have been able to laugh and see the funny side of things.  0    I have looked forward with enjoyment to things.  0    I have blamed myself unnecessarily when things went wrong.  1    I have been anxious or worried for no good reason.  3    I have felt scared or panicky for no good reason.  3    Things have been getting on top of me.  1    I have been so unhappy that I have had difficulty sleeping.  0    I have felt sad or miserable.  1    I have been so unhappy that I have been crying.  0    The thought of harming myself has occurred to me.  0    Edinburgh Postnatal Depression Scale Total  9       Assessment:Patient is a 23 y.o. [id number] who is 6 weeks postpartum from  a normal spontaneous vaginal delivery.  She is doing very well.   Plan:  Post partum Wasn't on meds prior to pg Check BP about once a week.   RTC 3-6 weeks for pap and physical, 2 hr gtt  I discussed the assessment and treatment plan with the patient. The patient was provided an opportunity to ask questions and all were answered. The patient agreed with the plan and demonstrated an understanding of the instructions.   The patient was advised to call back or seek an in-person evaluation/go to the ED for any concerning postpartum symptoms.  I provided 15 minutes of non-face-to-face time during this encounter.   Jacklyn Shell, CNM [facility] [employer], [facility]

## 2019-05-14 ENCOUNTER — Encounter (HOSPITAL_COMMUNITY): Payer: Self-pay | Admitting: Family Medicine

## 2019-06-05 ENCOUNTER — Other Ambulatory Visit: Payer: BC Managed Care – PPO | Admitting: Advanced Practice Midwife

## 2019-06-05 ENCOUNTER — Other Ambulatory Visit: Payer: BC Managed Care – PPO

## 2019-06-21 ENCOUNTER — Other Ambulatory Visit: Payer: BC Managed Care – PPO | Admitting: Obstetrics & Gynecology

## 2019-06-21 ENCOUNTER — Other Ambulatory Visit: Payer: BC Managed Care – PPO

## 2019-06-26 ENCOUNTER — Other Ambulatory Visit: Payer: BC Managed Care – PPO

## 2019-07-17 DIAGNOSIS — O169 Unspecified maternal hypertension, unspecified trimester: Secondary | ICD-10-CM | POA: Diagnosis not present

## 2019-07-20 ENCOUNTER — Ambulatory Visit: Payer: BC Managed Care – PPO

## 2019-07-20 ENCOUNTER — Other Ambulatory Visit: Payer: BC Managed Care – PPO

## 2019-07-23 ENCOUNTER — Telehealth: Payer: Self-pay | Admitting: *Deleted

## 2019-07-23 NOTE — Telephone Encounter (Signed)
Pt left message that she has something going on and needs a prescription for it.

## 2019-07-24 ENCOUNTER — Telehealth: Payer: Self-pay | Admitting: *Deleted

## 2019-07-24 NOTE — Telephone Encounter (Signed)
Pt states that she is pretty sure that she has chlamydia again. Wants to see if we can go ahead and send in meds or if she has to be seen.

## 2019-07-27 ENCOUNTER — Other Ambulatory Visit (HOSPITAL_COMMUNITY)
Admission: RE | Admit: 2019-07-27 | Discharge: 2019-07-27 | Disposition: A | Discharge status: Home / Self Care | Payer: BC Managed Care – PPO | Source: Ambulatory Visit | Attending: Obstetrics & Gynecology | Admitting: Obstetrics & Gynecology

## 2019-07-27 ENCOUNTER — Other Ambulatory Visit: Payer: BC Managed Care – PPO

## 2019-07-27 ENCOUNTER — Other Ambulatory Visit: Payer: Self-pay

## 2019-07-27 VITALS — Ht 67.0 in | Wt 220.2 lb

## 2019-07-27 DIAGNOSIS — Z113 Encounter for screening for infections with a predominantly sexual mode of transmission: Secondary | ICD-10-CM | POA: Diagnosis not present

## 2019-07-27 NOTE — Addendum Note (Signed)
Addended by: Federico Flake A on: 07/27/2019 11:02 AM   Modules accepted: Orders

## 2019-07-27 NOTE — Addendum Note (Signed)
Addended by: Federico Flake A on: 07/27/2019 10:56 AM   Modules accepted: Orders

## 2019-07-27 NOTE — Progress Notes (Signed)
   NURSE VISIT- VAGINITIS/STD/POC  SUBJECTIVE:  [name] is a 24 y.o. [phone] female here for a vaginal swab for std  She reports the following symptoms vaginal discharge with odor  OBJECTIVE:  Ht 5\' 7"  (1.702 m)   Wt 220 lb 3.2 oz (99.9 kg)   LMP 07/24/2019   BMI 34.49 kg/m   Appears well, in no apparent distress  ASSESSMENT: Vaginal swab for STD screening  PLAN: Self-collected vaginal /STD:}reatment: to be determined once results are received Follow-up as needed if symptoms persist/worsen, or new symptoms develop  09/21/2019  07/27/2019 10:44 AM

## 2019-07-30 ENCOUNTER — Telehealth: Payer: Self-pay | Admitting: *Deleted

## 2019-07-30 DIAGNOSIS — A749 Chlamydial infection, unspecified: Secondary | ICD-10-CM

## 2019-07-30 LAB — CERVICOVAGINAL ANCILLARY ONLY
Chlamydia: POSITIVE — AB
Comment: NEGATIVE
Comment: NORMAL
Neisseria Gonorrhea: NEGATIVE

## 2019-07-30 MED ORDER — AZITHROMYCIN 500 MG PO TABS: 1000.0000 mg | ORAL_TABLET | Freq: Once | ORAL | 1 refills | Status: AC

## 2019-07-30 NOTE — Telephone Encounter (Signed)
Patient left message to discuss my chart results.

## 2019-07-30 NOTE — Telephone Encounter (Signed)
Patient informed positive for chlamydia.  Treatment for she and partner sent to pharmacy.  No sex for 7 days and will need POC in 3-4 weeks.  Appt made. Pt verbalized understanding.

## 2019-08-02 ENCOUNTER — Other Ambulatory Visit: Payer: BC Managed Care – PPO

## 2019-08-03 ENCOUNTER — Encounter: Payer: Self-pay | Admitting: Advanced Practice Midwife

## 2019-08-07 ENCOUNTER — Other Ambulatory Visit: Payer: BC Managed Care – PPO

## 2019-08-22 ENCOUNTER — Encounter: Payer: Self-pay | Admitting: Advanced Practice Midwife

## 2019-08-30 ENCOUNTER — Other Ambulatory Visit: Payer: Self-pay

## 2019-09-07 ENCOUNTER — Other Ambulatory Visit: Payer: BC Managed Care – PPO | Admitting: Adult Health

## 2020-01-07 ENCOUNTER — Telehealth (HOSPITAL_COMMUNITY): Payer: Self-pay | Admitting: Psychiatry

## 2020-01-07 NOTE — Telephone Encounter (Signed)
Patient called wanting to make appointment, advised due to being over 1 year since provider has connected with patient a new referral would be needed from PCP. Patient advised she would reach out to PCP

## 2020-02-11 ENCOUNTER — Encounter: Payer: Self-pay | Admitting: Obstetrics and Gynecology

## 2020-02-11 ENCOUNTER — Other Ambulatory Visit (HOSPITAL_COMMUNITY)
Admission: RE | Admit: 2020-02-11 | Discharge: 2020-02-11 | Disposition: A | Discharge status: Home / Self Care | Payer: BC Managed Care – PPO | Source: Ambulatory Visit | Attending: Obstetrics and Gynecology | Admitting: Obstetrics and Gynecology

## 2020-02-11 ENCOUNTER — Ambulatory Visit (INDEPENDENT_AMBULATORY_CARE_PROVIDER_SITE_OTHER): Payer: Medicaid Other | Admitting: Obstetrics and Gynecology

## 2020-02-11 VITALS — BP 144/88 | HR 99 | Ht 67.0 in | Wt 239.2 lb

## 2020-02-11 DIAGNOSIS — Z124 Encounter for screening for malignant neoplasm of cervix: Secondary | ICD-10-CM | POA: Diagnosis not present

## 2020-02-11 NOTE — Progress Notes (Signed)
  Assessment:  Annual Gyn Exam Hidradenitis Desire for IUD placement for menstrual control Plan:  1. pap smear done, next pap due 5 years or as indicated by this Pap results 2. return 3 weeks for IUD insertion for attempted menstrual control 3    Annual mammogram advised after age 26 4.   Septra DS bid x 7d refil x 2 4.    Status post tubal ligation Subjective:  [name] is a 24 y.o. female [phone] who presents for annual exam. Patient's last menstrual period was 02/06/2020 (exact date). The patient has complaints today of intermittent episodes of boils in the vulvar region and under the arm  The following portions of the patient's history were reviewed and updated as appropriate: allergies, current medications, past family history, past medical history, past social history, past surgical history and problem list. Past Medical History:  Diagnosis Date  . Anxiety   . Asthma    childhood  . Depression    post partum  . Dyspnea   . Headache   . Hypertension   . Irregular menstrual bleeding 01/31/2014  . PTSD (post-traumatic stress disorder)    pTSD  . Recurrent upper respiratory infection (URI)    Bronchitis  . Vaginal Pap smear, abnormal    HPV    Past Surgical History:  Procedure Laterality Date  . CHOLECYSTECTOMY    . CYST REMOVAL NECK    . TUBAL LIGATION N/A 03/20/2019   Procedure: POST PARTUM TUBAL LIGATION;  Surgeon: Levie Heritage, DO;  Location: [facility] LD ORS;  Service: Gynecology;  Laterality: N/A;  . TYMPANOSTOMY TUBE PLACEMENT    . TYMPANOSTOMY TUBE PLACEMENT      No current outpatient medications on file.  Review of Systems Constitutional: positive for Skin boils Gastrointestinal: negative Genitourinary: Vulvar sebaceous cyst again infected  Objective:  BP (!) 144/88 (BP Location: Right Arm, Patient Position: Sitting, Cuff Size: Normal)   Pulse 99   Ht 5\' 7"  (1.702 m)   Wt 239 lb 3.2 oz (108.5 kg)   LMP 02/06/2020 (Exact Date)   BMI 37.46  kg/m    BMI: Body mass index is 37.46 kg/m.  General Appearance: Alert, appropriate appearance for age. No acute distress HEENT: Grossly normal Neck / Thyroid:  Cardiovascular: RRR; normal S1, S2, no murmur Lungs: CTA bilaterally Back: No CVAT Breast Exam: No dimpling, nipple retraction or discharge. No masses or nodes., Normal to inspection and No masses or nodes.No dimpling, nipple retraction or discharge. Gastrointestinal: Soft, non-tender, no masses or organomegaly Pelvic Exam: Vulva and vagina appear normal. Bimanual exam reveals normal uterus and adnexa. Rectovaginal: not indicated Lymphatic Exam: Non-palpable nodes in neck, clavicular, axillary, or inguinal regions Skin: no rash or abnormalities Neurologic: Normal gait and speech, no tremor  Psychiatric: Alert and oriented, appropriate affect.  Urinalysis:Not done  02/08/2020. MD Pgr [phone] 4:50 PM

## 2020-02-12 ENCOUNTER — Ambulatory Visit (HOSPITAL_COMMUNITY): Payer: Medicaid Other | Attending: Physician Assistant | Admitting: Physical Therapy

## 2020-02-12 ENCOUNTER — Encounter (HOSPITAL_COMMUNITY): Payer: Self-pay

## 2020-02-15 LAB — CYTOLOGY - PAP
Chlamydia: NEGATIVE
Comment: NEGATIVE
Comment: NEGATIVE
Comment: NORMAL
HPV 16: NEGATIVE
HPV 18 / 45: NEGATIVE
High risk HPV: POSITIVE — AB
Neisseria Gonorrhea: NEGATIVE

## 2020-02-17 NOTE — Progress Notes (Signed)
Colpo recommended, persistent abnormalities since 2019.l;needs repeat colpo and discussion of options.

## 2020-03-03 ENCOUNTER — Ambulatory Visit: Payer: Medicaid Other | Admitting: Obstetrics and Gynecology

## 2020-03-10 ENCOUNTER — Ambulatory Visit: Payer: Medicaid Other | Admitting: Obstetrics and Gynecology

## 2020-03-13 ENCOUNTER — Ambulatory Visit (INDEPENDENT_AMBULATORY_CARE_PROVIDER_SITE_OTHER): Payer: Medicaid Other | Admitting: Advanced Practice Midwife

## 2020-03-13 ENCOUNTER — Other Ambulatory Visit: Payer: Self-pay

## 2020-03-13 VITALS — BP 126/83 | HR 90 | Ht 67.0 in | Wt 235.0 lb

## 2020-03-13 DIAGNOSIS — Z3202 Encounter for pregnancy test, result negative: Secondary | ICD-10-CM

## 2020-03-13 DIAGNOSIS — N390 Urinary tract infection, site not specified: Secondary | ICD-10-CM | POA: Diagnosis not present

## 2020-03-13 DIAGNOSIS — Z30430 Encounter for insertion of intrauterine contraceptive device: Secondary | ICD-10-CM | POA: Insufficient documentation

## 2020-03-13 HISTORY — DX: Encounter for insertion of intrauterine contraceptive device: Z30.430

## 2020-03-13 LAB — POCT URINALYSIS DIPSTICK OB
Glucose, UA: NEGATIVE
Leukocytes, UA: NEGATIVE
Nitrite, UA: NEGATIVE

## 2020-03-13 LAB — POCT URINE PREGNANCY: Preg Test, Ur: NEGATIVE

## 2020-03-13 MED ORDER — SULFAMETHOXAZOLE-TRIMETHOPRIM 800-160 MG PO TABS: 1.0000 | ORAL_TABLET | Freq: Two times a day (BID) | ORAL | 3 refills | Status: DC

## 2020-03-13 MED ORDER — LEVONORGESTREL 19.5 MCG/DAY IU IUD
INTRAUTERINE_SYSTEM | Freq: Once | INTRAUTERINE | Status: AC
  Administered 2020-03-13: 1 via INTRAUTERINE

## 2020-03-13 MED ORDER — PHENAZOPYRIDINE HCL 200 MG PO TABS: 200.0000 mg | ORAL_TABLET | Freq: Three times a day (TID) | ORAL | 0 refills | Status: DC | PRN

## 2020-03-13 NOTE — Progress Notes (Signed)
Ema Mayci Haning is a 24 y.o. year old  female   who presents for placement of a Liletta IUD. Her LMP was 02/29/20 and her pregnancy test today is negative.    The risks and benefits of the method and placement have been thouroughly reviewed with the patient and all questions were answered.  Specifically the patient is aware of failure rate of 06/998, expulsion of the IUD and of possible perforation.  The patient is aware of irregular bleeding due to the method and understands the incidence of irregular bleeding diminishes with time.  Time out was performed.  A Graves speculum was placed.  The cervix was prepped using Betadine. The uterus was found to be neutral and it sounded to 8 cm.  The cervix was grasped with a tenaculum and the IUD was inserted to 8 cm.  It was pulled back 1 cm and the IUD was disengaged.  The strings were trimmed to 3 cm.  Sonogram was performed and the proper placement of the IUD was verified.  The patient was instructed on signs and symptoms of infection and to check for the strings after each menses or each month.  The patient is to refrain from intercourse for 3 days.  The patient is scheduled for a return appointment after her first menses or 4 weeks.  rx septra ([facility] was going to, but rx didn't go through) for HS and ? UTI  Jacklyn Shell 03/13/2020 12:03 PM

## 2020-03-13 NOTE — Patient Instructions (Signed)
IUD PLACEMENT POST-PROCEDURE INSTRUCTIONS  1. You may take Ibuprofen, Aleve or Tylenol for pain if needed.  Cramping should resolve within in 24 hours.  2. You may have a small amount of spotting.  You should wear a mini pad for the next few days  3.    Nothing in the vagina for 3 days (tampons or intercourse)  4.  You need to call if you have a fever (more than 100.4), any moderate to severe pelvic pain, fever, or foul smelling vaginal discharge.  Irregular bleeding is common the first several months after having an IUD placed. You do not need to call for this reason unless you are concerned.  5. Shower or bathe as normal  6.  You should have a follow-up appointment in 4-8 weeks for a re-check to make sure you are not having any problems.  
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
## 2020-04-03 ENCOUNTER — Encounter (HOSPITAL_COMMUNITY): Payer: Self-pay | Admitting: Psychiatry

## 2020-04-03 ENCOUNTER — Other Ambulatory Visit: Payer: Self-pay

## 2020-04-03 ENCOUNTER — Telehealth (INDEPENDENT_AMBULATORY_CARE_PROVIDER_SITE_OTHER): Payer: Medicaid Other | Admitting: Psychiatry

## 2020-04-03 DIAGNOSIS — F4310 Post-traumatic stress disorder, unspecified: Secondary | ICD-10-CM

## 2020-04-03 DIAGNOSIS — F331 Major depressive disorder, recurrent, moderate: Secondary | ICD-10-CM | POA: Diagnosis not present

## 2020-04-03 MED ORDER — DULOXETINE HCL 30 MG PO CPEP: 30.0000 mg | ORAL_CAPSULE | Freq: Every day | ORAL | 1 refills | Status: DC

## 2020-04-03 NOTE — Patient Instructions (Signed)
1. Start duloxetine 30 mg daily  2. Referral to therapy 3. Next appointment: 12/9 at 3:30

## 2020-04-08 ENCOUNTER — Telehealth (HOSPITAL_COMMUNITY): Payer: Self-pay | Admitting: Psychiatry

## 2020-04-08 NOTE — Telephone Encounter (Signed)
Left detailed message advising therapy appt needed and to please return call to schedule

## 2020-04-14 ENCOUNTER — Ambulatory Visit (INDEPENDENT_AMBULATORY_CARE_PROVIDER_SITE_OTHER): Payer: Medicaid Other | Admitting: Clinical

## 2020-04-14 ENCOUNTER — Encounter: Payer: Medicaid Other | Admitting: Obstetrics & Gynecology

## 2020-04-14 ENCOUNTER — Other Ambulatory Visit: Payer: Self-pay

## 2020-04-14 DIAGNOSIS — F4310 Post-traumatic stress disorder, unspecified: Secondary | ICD-10-CM

## 2020-04-14 DIAGNOSIS — F3130 Bipolar disorder, current episode depressed, mild or moderate severity, unspecified: Secondary | ICD-10-CM | POA: Diagnosis not present

## 2020-04-14 DIAGNOSIS — F411 Generalized anxiety disorder: Secondary | ICD-10-CM | POA: Diagnosis not present
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
## 2020-05-01 ENCOUNTER — Other Ambulatory Visit: Payer: Self-pay

## 2020-05-01 ENCOUNTER — Ambulatory Visit (INDEPENDENT_AMBULATORY_CARE_PROVIDER_SITE_OTHER): Payer: Medicaid Other | Admitting: Clinical

## 2020-05-01 DIAGNOSIS — F3130 Bipolar disorder, current episode depressed, mild or moderate severity, unspecified: Secondary | ICD-10-CM | POA: Diagnosis not present

## 2020-05-01 DIAGNOSIS — F411 Generalized anxiety disorder: Secondary | ICD-10-CM

## 2020-05-01 DIAGNOSIS — F4310 Post-traumatic stress disorder, unspecified: Secondary | ICD-10-CM | POA: Diagnosis not present

## 2020-05-01 NOTE — Progress Notes (Addendum)
Virtual Visit via Video Note  I connected with [name] on 05/01/20 at  9:00 AM EST by a video enabled telemedicine application and verified that I am speaking with the correct person using two identifiers.  Location: Patient: Home Provider: Office   I discussed the limitations of evaluation and management by telemedicine and the availability of in person appointments. The patient expressed understanding and agreed to proceed.   THERAPIST PROGRESS NOTE   Session Time:9:00AM-9:45AM  Participation Level:Active  Behavioral Response:Casual andAlert,Depressed  Type of Therapy:Individual Therapy  Treatment Goals addressed:Depression and Impulsivity  Interventions:CBT  Summary: Delice Lesch Aatkinsis 24 presents with PTSD/Bipolar/Anxiety.The OPT therapist worked with thepatientfor herinitial OPT treatment. The OPT therapist utilized Motivational Interviewing to assist in creating therapeutic repore. The patient in the session was engaged and work in Tour manager about hertriggers and symptoms over the past few weeks includingchanges with job, car trouble, and disclosure around her relationship with her bio Father.The OPT therapist utilized Cognitive Behavioral Therapy through cognitive restructuring as well as worked with the patient on coping strategies to assist in management of mood as well as changes in sleep and eating habits to promote health.  Suicidal/Homicidal:Nowithout intent/plan  Therapist Response:The OPT therapist worked with the patient for the patients scheduled session. The patient was engaged in hersession and gave feedback in relation to triggers, symptoms, and behavior responses over the past fewweeks. The OPT therapist worked with the patient utilizing an in session Cognitive Behavioral Therapy exercise. The patient was responsive in the session and verbalized, "Things were bad then got better my  car broke down but is fixed and I lost a job but gained a job making 3$ more per hour". The OPT therapist worked with the patient providing ongoing psycho-education. The OPT therapist continued to support the patients changes in sleep as well as eating habits.The OPT therapist will continue treatment work with the patient in hernext scheduled session  Plan: Return again in2/3weeks.  Diagnosis:Axis I:PTSD/BiPolar Disorder/GAD Axis II:No diagnosis  I discussed the assessment and treatment plan with the patient. The patient was provided an opportunity to ask questions and all were answered. The patient agreed with the plan and demonstrated an understanding of the instructions.  The patient was advised to call back or seek an in-person evaluation if the symptoms worsen or if the condition fails to improve as anticipated.  I provided45 minutes of non-face-to-face time during this encounter.  Winfred Burn, LCSW 05/01/2020
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
## 2020-05-22 ENCOUNTER — Telehealth (HOSPITAL_COMMUNITY): Payer: Medicaid Other | Admitting: Psychiatry

## 2020-05-22 ENCOUNTER — Telehealth (HOSPITAL_COMMUNITY): Payer: Self-pay | Admitting: Clinical

## 2020-05-22 ENCOUNTER — Other Ambulatory Visit: Payer: Self-pay

## 2020-05-22 ENCOUNTER — Telehealth: Payer: Self-pay | Admitting: Psychiatry

## 2020-05-22 ENCOUNTER — Encounter: Payer: Medicaid Other | Admitting: Psychiatry

## 2020-05-22 ENCOUNTER — Ambulatory Visit (HOSPITAL_COMMUNITY): Payer: Medicaid Other | Admitting: Clinical

## 2020-05-22 NOTE — Telephone Encounter (Signed)
Sent link for video visit through Epic. Patient did not sign in. Called the patient for appointment scheduled today. She states that she forgot this appointment, but agreed to log in. Although she once logged in, she asks this provider to wait so that children are out of the room. It was later disconnected abruptly. Called the patient twice, and she did not answer the phone. Left a voice message to contact the office.

## 2020-05-22 NOTE — Telephone Encounter (Signed)
The patient did not respond to video text link, phone call or VM and missed her scheduled appointment.

## 2020-05-23 NOTE — Progress Notes (Signed)
This encounter was created in error - please disregard.

## 2020-07-14 ENCOUNTER — Ambulatory Visit: Payer: Medicaid Other | Admitting: Adult Health

## 2020-07-14 ENCOUNTER — Telehealth: Payer: Self-pay | Admitting: Women's Health

## 2020-07-14 NOTE — Telephone Encounter (Signed)
Had to cancel appt 07/14/2020 due to no water  Pt wants to know if today's canceled visit can be added to 07/22/2020 visit  Note for 07/14/2020 - possible yeast infection, requested pelvic exam, also may need to repeat sugar test due to history of gest. diab,   Please advise

## 2020-07-22 ENCOUNTER — Ambulatory Visit (INDEPENDENT_AMBULATORY_CARE_PROVIDER_SITE_OTHER): Payer: Medicaid Other | Admitting: Women's Health

## 2020-07-22 ENCOUNTER — Telehealth: Payer: Self-pay | Admitting: Women's Health

## 2020-07-22 ENCOUNTER — Encounter: Payer: Self-pay | Admitting: Women's Health

## 2020-07-22 ENCOUNTER — Other Ambulatory Visit: Payer: Self-pay

## 2020-07-22 ENCOUNTER — Other Ambulatory Visit: Payer: Self-pay | Admitting: Women's Health

## 2020-07-22 ENCOUNTER — Other Ambulatory Visit (HOSPITAL_COMMUNITY)
Admission: RE | Admit: 2020-07-22 | Discharge: 2020-07-22 | Disposition: A | Discharge status: Home / Self Care | Payer: Medicaid Other | Source: Ambulatory Visit | Attending: Obstetrics & Gynecology | Admitting: Obstetrics & Gynecology

## 2020-07-22 VITALS — BP 124/88 | HR 79 | Ht 67.0 in | Wt 248.0 lb

## 2020-07-22 DIAGNOSIS — Z3202 Encounter for pregnancy test, result negative: Secondary | ICD-10-CM

## 2020-07-22 DIAGNOSIS — N898 Other specified noninflammatory disorders of vagina: Secondary | ICD-10-CM

## 2020-07-22 DIAGNOSIS — Z8632 Personal history of gestational diabetes: Secondary | ICD-10-CM

## 2020-07-22 DIAGNOSIS — R87612 Low grade squamous intraepithelial lesion on cytologic smear of cervix (LGSIL): Secondary | ICD-10-CM

## 2020-07-22 LAB — POCT URINE PREGNANCY: Preg Test, Ur: NEGATIVE

## 2020-07-22 NOTE — Progress Notes (Signed)
   COLPOSCOPY PROCEDURE NOTE Patient name: [name] MRN [id number]  Date of birth: 07/31/1995 Subjective Findings:   [name] is a 25 y.o. [phone] Caucasian female being seen today for a colposcopy. Reports vaginal d/c, no odor/itching/irritation. Some pain w/ sex since IUD insertion in Sept. 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 Contraception: IUD. Menopausal: no. Hysterectomy: no.   Smoker: Yes.  Immunocompromised: no. = The risks and benefits were explained and informed consent was obtained, and written copy is in chart. Pertinent History Reviewed:   Reviewed past medical,surgical, social, obstetrical and family history.  Reviewed problem list, medications and allergies. Objective Findings & Procedure:   Vitals:   07/22/20 1035  BP: 124/88  Pulse: 79  Weight: 248 lb (112.5 kg)  Height: 5\' 7"  (1.702 m)  Body mass index is 38.84 kg/m.  Results for orders placed or performed in visit on 07/22/20 (from the past 24 hour(s))  POCT urine pregnancy   Collection Time: 07/22/20 10:37 AM  Result Value Ref Range   Preg Test, Ur Negative Negative     Time out was performed.  Speculum placed in the vagina, cervix fully visualized. SCJ: fully visualized. Cervix swabbed x 3 with acetic acid.  Acetowhitening present: Yes Cervix: acetowhite lesion(s) noted at 1:30 o'clock, punctation noted at 1:30 o'clock and mosaicism noted at 1:30 o'clock. cervical biopsies taken at 1:30 o'clock. Monsels applied for hemostasis Vagina: vaginal colposcopy not performed Vulva: vulvar colposcopy not performed  Specimens: 1  Complications: none  Preceptor: Dr. 09/19/20 Colposcopic Impression & Plan:   Colposcopy findings consistent with HSIL Plan: Will base  plan of care on pathology results and ASCCP guidelines and Will call patient with results  Return for will call pt.  Despina Hidden CNM, [facility] 07/22/2020 11:14 AM

## 2020-07-22 NOTE — Patient Instructions (Signed)
Colpocleisis, Care After The following information offers guidance on how to care for yourself after your procedure. Your health care provider may also give you more specific instructions. If you have problems or questions, contact your health care provider. What can I expect after the procedure? After the procedure, it is common to have:  Discomfort in the vagina. This can be relieved by pain medicine.  Spotting of blood from the vagina. Follow these instructions at home: Medicines  Take over-the-counter and prescription medicines only as told by your health care provider.  If you were prescribed an antibiotic medicine, use it as told by your health care provider. Do not stop using the antibiotic even if you start to feel better.  Ask your health care provider if the medicine prescribed to you: ? Requires you to avoid driving or using machinery. ? Can cause constipation. You may need to take these actions to prevent or treat constipation:  Drink enough fluid to keep your urine pale yellow.  Take over-the-counter or prescription medicines.  Eat foods that are high in fiber, such as beans, whole grains, and fresh fruits and vegetables.  Limit foods that are high in fat and processed sugars, such as fried or sweet foods. Managing pain and discomfort Your health care provider may say that you can take warm sitz baths 2-3 times a day to reduce swelling and discomfort. Make sure to check with your health care provider before starting sitz baths. If you are going to take a sitz bath:  Fill the bathtub halfway with warm water.  Sit in the water and open the drain a little.  Turn on the warm water to keep the tub half full. Keep the water running constantly.  Soak in the water for 15-20 minutes.  After the sitz bath, pat the affected area dry first.   Activity  Rest as told by your health care provider. ? Avoid sitting for a long time without moving. Get up to take short walks every  1-2 hours. This is important to improve blood flow and breathing. Ask for help if you feel weak or unsteady.  Do not lift anything that is heavier than 5 lb (2.3 kg), or the limit that you are told, until your health care provider says that it is safe.  Return to your normal activities as told by your health care provider. Ask your health care provider what activities are safe for you.   General instructions  Do not swim or use a hot tub until your health care provider approves. Ask your health care provider if you may take showers.  Follow instructions from your health care provider about eating or drinking restrictions.  Do not use any products that contain nicotine or tobacco. These products include cigarettes, chewing tobacco, and vaping devices, such as e-cigarettes. These can delay incision healing after surgery. If you need help quitting, ask your health care provider.  Wear compression stockings as told by your health care provider. These stockings help to prevent blood clots and reduce swelling in your legs.  Your health care provider will want to make sure that you can empty your bladder. You might need to use a small, thin tube (catheter) to keep your bladder empty for a short time after surgery. Follow instructions from your health care provider about how to care for the catheter.  Keep all follow-up visits. This is important. Contact a health care provider if you:  Have a fever.  Have pus or a bad smell  coming from your vagina.  Have blood or pain when you urinate, or you have pain in your abdomen.  Have heavy bleeding from your vagina.  Develop a rash.  Think the sutures in your incision are breaking.  Have nausea, diarrhea, or constipation. Get help right away if you:  Develop shortness of breath.  Develop leg or chest pain.  Faint. These symptoms may represent a serious problem that is an emergency. Do not wait to see if the symptoms will go away. Get medical  help right away. Call your local emergency services (911 in the [place] Do not drive yourself to the hospital. Summary  After the procedure, it is common to have discomfort in the vagina. This can be relieved by pain medicine.  You may take warm sitz baths 2-3 times a day to reduce swelling and discomfort.  Do not lift anything that is heavier than 5 lb (2.3 kg), or the limit that you are told, until your health care provider says that it is safe.  Follow instructions from your health care provider about rest, general activities, driving, and eating and drinking. This information is not intended to replace advice given to you by your health care provider. Make sure you discuss any questions you have with your health care provider. Document Revised: 01/12/2020 Document Reviewed: 01/12/2020 Elsevier Patient Education  2021 [employer].

## 2020-07-22 NOTE — Telephone Encounter (Signed)
Pt wondering if she needs any follow up for the gestational diabetes she had, states she never follow up on that from 2020  Please advise & notify pt

## 2020-07-23 LAB — CERVICOVAGINAL ANCILLARY ONLY
Bacterial Vaginitis (gardnerella): POSITIVE — AB
Candida Glabrata: NEGATIVE
Candida Vaginitis: NEGATIVE
Chlamydia: NEGATIVE
Comment: NEGATIVE
Comment: NEGATIVE
Comment: NEGATIVE
Comment: NEGATIVE
Comment: NEGATIVE
Comment: NORMAL
Neisseria Gonorrhea: NEGATIVE
Trichomonas: NEGATIVE

## 2020-07-24 ENCOUNTER — Other Ambulatory Visit: Payer: Self-pay | Admitting: Advanced Practice Midwife

## 2020-07-24 MED ORDER — METRONIDAZOLE 500 MG PO TABS: 500.0000 mg | ORAL_TABLET | Freq: Two times a day (BID) | ORAL | 0 refills | Status: DC

## 2020-07-24 NOTE — Progress Notes (Signed)
Flagyl for BV 

## 2020-07-29 ENCOUNTER — Telehealth (INDEPENDENT_AMBULATORY_CARE_PROVIDER_SITE_OTHER): Payer: Medicaid Other | Admitting: Psychiatry

## 2020-07-29 ENCOUNTER — Other Ambulatory Visit: Payer: Self-pay

## 2020-07-29 ENCOUNTER — Encounter: Payer: Self-pay | Admitting: Psychiatry

## 2020-07-29 DIAGNOSIS — F331 Major depressive disorder, recurrent, moderate: Secondary | ICD-10-CM | POA: Diagnosis not present

## 2020-07-29 DIAGNOSIS — F4310 Post-traumatic stress disorder, unspecified: Secondary | ICD-10-CM | POA: Diagnosis not present

## 2020-07-29 MED ORDER — DULOXETINE HCL 60 MG PO CPEP: 60.0000 mg | ORAL_CAPSULE | Freq: Every day | ORAL | 1 refills | Status: DC
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
## 2020-08-27 ENCOUNTER — Other Ambulatory Visit: Payer: Medicaid Other

## 2020-09-02 ENCOUNTER — Other Ambulatory Visit: Payer: Self-pay

## 2020-09-02 ENCOUNTER — Telehealth (INDEPENDENT_AMBULATORY_CARE_PROVIDER_SITE_OTHER): Payer: Medicaid Other | Admitting: Psychiatry

## 2020-09-02 ENCOUNTER — Encounter: Payer: Self-pay | Admitting: Psychiatry

## 2020-09-02 DIAGNOSIS — F330 Major depressive disorder, recurrent, mild: Secondary | ICD-10-CM

## 2020-09-02 DIAGNOSIS — G4700 Insomnia, unspecified: Secondary | ICD-10-CM | POA: Diagnosis not present

## 2020-09-02 DIAGNOSIS — F4310 Post-traumatic stress disorder, unspecified: Secondary | ICD-10-CM

## 2020-09-02 MED ORDER — DULOXETINE HCL 30 MG PO CPEP: 90.0000 mg | ORAL_CAPSULE | Freq: Every day | ORAL | 1 refills | Status: DC

## 2020-09-02 NOTE — Patient Instructions (Signed)
1. Increase duloxetine 90 mg daily  2. Referral to therapy 3. Next appointment: 4/26 at 3:30
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
## 2020-10-07 ENCOUNTER — Encounter: Payer: Self-pay | Admitting: Psychiatry

## 2020-10-07 ENCOUNTER — Telehealth (INDEPENDENT_AMBULATORY_CARE_PROVIDER_SITE_OTHER): Payer: BC Managed Care – PPO | Admitting: Psychiatry

## 2020-10-07 ENCOUNTER — Telehealth (HOSPITAL_COMMUNITY): Payer: Self-pay | Admitting: Psychiatry

## 2020-10-07 ENCOUNTER — Other Ambulatory Visit: Payer: Self-pay

## 2020-10-07 DIAGNOSIS — F330 Major depressive disorder, recurrent, mild: Secondary | ICD-10-CM

## 2020-10-07 DIAGNOSIS — F4310 Post-traumatic stress disorder, unspecified: Secondary | ICD-10-CM

## 2020-10-07 MED ORDER — BUPROPION HCL ER (XL) 150 MG PO TB24: 150.0000 mg | ORAL_TABLET | Freq: Every day | ORAL | 0 refills | Status: DC

## 2020-10-07 MED ORDER — DULOXETINE HCL 30 MG PO CPEP: 90.0000 mg | ORAL_CAPSULE | Freq: Every day | ORAL | 2 refills | Status: DC

## 2020-10-07 NOTE — Patient Instructions (Addendum)
1. Continue duloxetine 90 mg night 2. Start bupropion 150 mg daily  3. Referral to therapy  4. Next appointment:5/27 at 9:20

## 2020-10-07 NOTE — Telephone Encounter (Signed)
Called to schedule NP therapy appt per Dr. Vanetta Shawl, left detailed vm to return call and schedule.

## 2020-10-09 ENCOUNTER — Institutional Professional Consult (permissible substitution): Payer: Managed Care, Other (non HMO) | Admitting: Neurology

## 2020-10-09 ENCOUNTER — Telehealth: Payer: Self-pay | Admitting: Neurology

## 2020-10-09 NOTE — Telephone Encounter (Signed)
Cancelled sleep consult due to MD being out sick.
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
## 2020-11-04 ENCOUNTER — Other Ambulatory Visit: Payer: Self-pay

## 2020-11-04 ENCOUNTER — Telehealth (INDEPENDENT_AMBULATORY_CARE_PROVIDER_SITE_OTHER): Payer: BC Managed Care – PPO | Admitting: Psychiatry

## 2020-11-04 ENCOUNTER — Encounter: Payer: Self-pay | Admitting: Psychiatry

## 2020-11-04 DIAGNOSIS — F331 Major depressive disorder, recurrent, moderate: Secondary | ICD-10-CM | POA: Diagnosis not present

## 2020-11-04 DIAGNOSIS — F4310 Post-traumatic stress disorder, unspecified: Secondary | ICD-10-CM

## 2020-11-04 MED ORDER — BUPROPION HCL ER (XL) 300 MG PO TB24: 300.0000 mg | ORAL_TABLET | Freq: Every day | ORAL | 1 refills | Status: DC

## 2020-11-04 NOTE — Patient Instructions (Signed)
1.Continue duloxetine 90 mg night 2. Increase bupropion 300 mg daily  3. Next appointment:7/21 at 2 PM

## 2020-11-07 ENCOUNTER — Telehealth: Payer: Medicaid Other | Admitting: Psychiatry

## 2020-12-09 ENCOUNTER — Encounter: Payer: Self-pay | Admitting: Neurology

## 2020-12-09 ENCOUNTER — Telehealth: Payer: Self-pay

## 2020-12-09 ENCOUNTER — Institutional Professional Consult (permissible substitution): Payer: Managed Care, Other (non HMO) | Admitting: Neurology

## 2020-12-09 NOTE — Telephone Encounter (Signed)
Pt no showed 12/09/20 appt with Dr. Frances Furbish.

## 2020-12-18 ENCOUNTER — Other Ambulatory Visit: Payer: BC Managed Care – PPO
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
## 2021-01-01 ENCOUNTER — Other Ambulatory Visit: Payer: Self-pay

## 2021-01-01 ENCOUNTER — Telehealth: Payer: Self-pay | Admitting: Psychiatry

## 2021-01-01 ENCOUNTER — Telehealth: Payer: BC Managed Care – PPO | Admitting: Psychiatry

## 2021-01-01 NOTE — Telephone Encounter (Signed)
Sent link for video visit through Epic. Patient did not sign in. Called the patient for appointment scheduled today. The patient did not answer the phone. Left voice message to contact the office ([phone]).   ?
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
## 2021-03-19 ENCOUNTER — Telehealth (INDEPENDENT_AMBULATORY_CARE_PROVIDER_SITE_OTHER): Payer: BC Managed Care – PPO | Admitting: Psychiatry

## 2021-03-19 ENCOUNTER — Other Ambulatory Visit: Payer: Self-pay

## 2021-03-19 ENCOUNTER — Encounter: Payer: Self-pay | Admitting: Psychiatry

## 2021-03-19 DIAGNOSIS — F331 Major depressive disorder, recurrent, moderate: Secondary | ICD-10-CM

## 2021-03-19 DIAGNOSIS — F4310 Post-traumatic stress disorder, unspecified: Secondary | ICD-10-CM | POA: Diagnosis not present

## 2021-03-19 MED ORDER — DULOXETINE HCL 30 MG PO CPEP: 90.0000 mg | ORAL_CAPSULE | Freq: Every day | ORAL | 1 refills | Status: DC

## 2021-03-19 MED ORDER — HYDROXYZINE HCL 25 MG PO TABS: 25.0000 mg | ORAL_TABLET | Freq: Every day | ORAL | 1 refills | Status: DC | PRN

## 2021-03-19 NOTE — Patient Instructions (Signed)
1. Continue duloxetine 90 mg night 2. Start abilify 2 mg daily  3. Next appointment: 12/1 at 10 AM

## 2021-03-27 ENCOUNTER — Telehealth: Payer: Self-pay

## 2021-03-27 ENCOUNTER — Other Ambulatory Visit: Payer: Self-pay | Admitting: Psychiatry

## 2021-03-27 MED ORDER — ARIPIPRAZOLE 2 MG PO TABS: 2.0000 mg | ORAL_TABLET | Freq: Every day | ORAL | 1 refills | Status: DC

## 2021-03-27 NOTE — Telephone Encounter (Signed)
Sorry, ordered

## 2021-03-27 NOTE — Telephone Encounter (Signed)
pt called states that the pharmacy did not received the rx for the abilify. please send today so she can start medication.

## 2021-03-30 ENCOUNTER — Other Ambulatory Visit: Payer: Self-pay | Admitting: Psychiatry

## 2021-03-30 MED ORDER — DULOXETINE HCL 30 MG PO CPEP: 90.0000 mg | ORAL_CAPSULE | Freq: Every day | ORAL | 0 refills | Status: DC

## 2021-03-30 MED ORDER — ARIPIPRAZOLE 2 MG PO TABS: 2.0000 mg | ORAL_TABLET | Freq: Every day | ORAL | 0 refills | Status: DC

## 2021-03-30 NOTE — Telephone Encounter (Signed)
Decline as the dose may change

## 2021-03-30 NOTE — Telephone Encounter (Signed)
received fax that states insurance will only pay for 90 day supply

## 2021-03-30 NOTE — Telephone Encounter (Signed)
A 90 day supply is needed for the duloxetine .

## 2021-03-30 NOTE — Telephone Encounter (Signed)
Ordered. Could you contact the pharmacy, and cancel any other duloxetine orders from me? Thanks.

## 2021-03-30 NOTE — Telephone Encounter (Signed)
Abilify was reordered for 90 days due to the request from the \\insurance  company. Could you contact the pharmacy and cancel monthly orders? thanks

## 2021-04-14 ENCOUNTER — Other Ambulatory Visit: Payer: Self-pay | Admitting: *Deleted

## 2021-05-11 ENCOUNTER — Ambulatory Visit
Admission: EM | Admit: 2021-05-11 | Discharge: 2021-05-11 | Disposition: A | Discharge status: Home / Self Care | Payer: BC Managed Care – PPO | Attending: Family Medicine | Admitting: Family Medicine

## 2021-05-11 ENCOUNTER — Other Ambulatory Visit: Payer: Self-pay

## 2021-05-11 ENCOUNTER — Encounter: Payer: Self-pay | Admitting: Emergency Medicine

## 2021-05-11 DIAGNOSIS — R519 Headache, unspecified: Secondary | ICD-10-CM

## 2021-05-11 DIAGNOSIS — R52 Pain, unspecified: Secondary | ICD-10-CM | POA: Diagnosis not present

## 2021-05-11 DIAGNOSIS — J069 Acute upper respiratory infection, unspecified: Secondary | ICD-10-CM

## 2021-05-11 DIAGNOSIS — R509 Fever, unspecified: Secondary | ICD-10-CM | POA: Diagnosis not present

## 2021-05-11 MED ORDER — KETOROLAC TROMETHAMINE 60 MG/2ML IM SOLN
60.0000 mg | Freq: Once | INTRAMUSCULAR | Status: AC
  Administered 2021-05-11: 14:00:00 60 mg via INTRAMUSCULAR

## 2021-05-11 MED ORDER — OSELTAMIVIR PHOSPHATE 75 MG PO CAPS: 75.0000 mg | ORAL_CAPSULE | Freq: Two times a day (BID) | ORAL | 0 refills | Status: DC

## 2021-05-11 MED ORDER — PROMETHAZINE-DM 6.25-15 MG/5ML PO SYRP: 5.0000 mL | ORAL_SOLUTION | Freq: Four times a day (QID) | ORAL | 0 refills | Status: DC | PRN

## 2021-05-11 NOTE — ED Provider Notes (Signed)
[facility]    CSN: [id number] Arrival date & time: 05/11/21  1001      History   Chief Complaint Chief Complaint  Patient presents with   Fever   Cough   Generalized Body Aches   Nasal Congestion    HPI [name] is a 25 y.o. female.   Presenting today with 4-day history of worsening cough, congestion, body aches, headaches, fever, chills, weakness, severe body aches, fatigue.  Denies shortness of breath, abdominal pain, nausea vomiting or diarrhea.  Taking Tylenol, cold and congestion medications with minimal relief.  No known sick contacts recently.  No known pertinent chronic medical problems per patient.   Past Medical History:  Diagnosis Date   Anxiety    Asthma    childhood   Depression    post partum   Dyspnea    Headache    Hypertension    Irregular menstrual bleeding 01/31/2014   PTSD (post-traumatic stress disorder)    pTSD   Recurrent upper respiratory infection (URI)    Bronchitis   Vaginal Pap smear, abnormal    HPV    Patient Active Problem List   Diagnosis Date Noted   Encounter for IUD insertion 03/13/2020   Chronic hypertension 03/20/2019   History of gestational diabetes 12/25/2018   PTSD (post-traumatic stress disorder) 03/30/2018   MDD (major depressive disorder), recurrent episode, moderate (HCC) 03/30/2018   Multiple acquired skin tags 02/18/2017   Abnormal EKG 01/02/2017   Tachycardia 01/02/2017   Abnormal Pap smear of cervix 07/26/2016   Smoker 05/07/2013    Past Surgical History:  Procedure Laterality Date   CHOLECYSTECTOMY     CYST REMOVAL NECK     TUBAL LIGATION N/A 03/20/2019   Procedure: POST PARTUM TUBAL LIGATION;  Surgeon: Levie Heritage, DO;  Location: [facility];  Service: Gynecology;  Laterality: N/A;   TYMPANOSTOMY TUBE PLACEMENT     TYMPANOSTOMY TUBE PLACEMENT      OB History     Gravida  3   Para  3   Term  3   Preterm      AB      Living  3      SAB      IAB       Ectopic      Multiple  0   Live Births  3            Home Medications    Prior to Admission medications   Medication Sig Start Date End Date Taking? Authorizing Provider  oseltamivir (TAMIFLU) 75 MG capsule Take 1 capsule (75 mg total) by mouth every 12 (twelve) hours. 05/11/21  Yes Particia Nearing, PA-C  promethazine-dextromethorphan (PROMETHAZINE-DM) 6.25-15 MG/5ML syrup Take 5 mLs by mouth 4 (four) times daily as needed. 05/11/21  Yes Particia Nearing, PA-C  ARIPiprazole (ABILIFY) 2 MG tablet Take 1 tablet (2 mg total) by mouth daily. 03/30/21 06/28/21  Neysa Hotter, MD  DULoxetine (CYMBALTA) 30 MG capsule Take 3 capsules (90 mg total) by mouth daily. 11/02/20 01/31/21  Neysa Hotter, MD  DULoxetine (CYMBALTA) 30 MG capsule Take 3 capsules (90 mg total) by mouth daily. 03/30/21 06/28/21  Neysa Hotter, MD  hydrOXYzine (ATARAX/VISTARIL) 25 MG tablet Take 1 tablet (25 mg total) by mouth daily as needed. 03/19/21 05/18/21  Neysa Hotter, MD    Family History Family History  Problem Relation Age of Onset   Bipolar disorder Maternal Grandmother    Heart disease Maternal Grandfather  Thyroid disease Paternal Grandmother    Arthritis Mother    Fibromyalgia Mother    Diabetes Mother    Cancer Mother        vaginal   Birth defects Father     Social History Social History   Tobacco Use   Smoking status: Every Day    Packs/day: 1.00    Years: 9.00    Pack years: 9.00    Types: Cigarettes   Smokeless tobacco: Never  Vaping Use   Vaping Use: Never used  Substance Use Topics   Alcohol use: Yes    Comment: socially   Drug use: No    Types: Marijuana    Comment: denies use 06/20/14     Allergies   Patient has no known allergies.   Review of Systems Review of Systems Per HPI  Physical Exam Triage Vital Signs ED Triage Vitals [05/11/21 1347]  Enc Vitals Group     BP (!) 132/100     Pulse Rate (!) 142     Resp 18     Temp 99 F (37.2 C)     Temp  Source Oral     SpO2 98 %     Weight      Height      Head Circumference      Peak Flow      Pain Score 8     Pain Loc      Pain Edu?      Excl. in GC?    No data found.  Updated Vital Signs BP (!) 132/100 (BP Location: Right Arm)   Pulse (!) 142   Temp 99 F (37.2 C) (Oral)   Resp 18   SpO2 98%   Visual Acuity Right Eye Distance:   Left Eye Distance:   Bilateral Distance:    Right Eye Near:   Left Eye Near:    Bilateral Near:     Physical Exam Vitals and nursing note reviewed.  Constitutional:      Appearance: She is not toxic-appearing.  HENT:     Head: Atraumatic.     Right Ear: Tympanic membrane and external ear normal.     Left Ear: Tympanic membrane and external ear normal.     Nose: Rhinorrhea present.     Mouth/Throat:     Mouth: Mucous membranes are moist.     Pharynx: Posterior oropharyngeal erythema present.  Eyes:     Extraocular Movements: Extraocular movements intact.     Conjunctiva/sclera: Conjunctivae normal.  Cardiovascular:     Rate and Rhythm: Normal rate and regular rhythm.     Heart sounds: Normal heart sounds.  Pulmonary:     Effort: Pulmonary effort is normal.     Breath sounds: Normal breath sounds. No wheezing or rales.  Musculoskeletal:        General: Normal range of motion.     Cervical back: Normal range of motion and neck supple.  Skin:    General: Skin is warm and dry.  Neurological:     Mental Status: She is alert and oriented to person, place, and time.  Psychiatric:        Mood and Affect: Mood normal.        Thought Content: Thought content normal.     UC Treatments / Results  Labs (all labs ordered are listed, but only abnormal results are displayed) Labs Reviewed - No data to display  EKG   Radiology No results found.  Procedures Procedures (including critical  care time)  Medications Ordered in UC Medications  ketorolac (TORADOL) injection 60 mg (60 mg Intramuscular Given 05/11/21 1414)    Initial  Impression / Assessment and Plan / UC Course  I have reviewed the triage vital signs and the nursing notes.  Pertinent labs & imaging results that were available during my care of the patient were reviewed by me and considered in my medical decision making (see chart for details).     Patient appears very uncomfortable due to body aches, moaning during exam.  Will give IM Toradol to help with body aches, continue Tylenol, will start Tamiflu proactively while awaiting COVID and flu testing.  Discussed Phenergan DM, over-the-counter cough and congestion medications.  Return for acutely worsening symptoms.  Final Clinical Impressions(s) / UC Diagnoses   Final diagnoses:  Viral URI with cough  Fever, unspecified  Generalized body aches  Acute nonintractable headache, unspecified headache type   Discharge Instructions   None    ED Prescriptions     Medication Sig Dispense Auth. Provider   oseltamivir (TAMIFLU) 75 MG capsule Take 1 capsule (75 mg total) by mouth every 12 (twelve) hours. 10 capsule Particia Nearing, [place]   promethazine-dextromethorphan (PROMETHAZINE-DM) 6.25-15 MG/5ML syrup Take 5 mLs by mouth 4 (four) times daily as needed. 100 mL Particia Nearing, [place]      PDMP not reviewed this encounter.   Particia Nearing, [place] 05/11/21 1415

## 2021-05-11 NOTE — ED Triage Notes (Signed)
Pt presents with cough, body aches, runny nose, and fever xs 4 days. Last dose of tylenol around 0700 today.
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
## 2021-05-14 ENCOUNTER — Other Ambulatory Visit: Payer: Self-pay

## 2021-05-14 ENCOUNTER — Telehealth (INDEPENDENT_AMBULATORY_CARE_PROVIDER_SITE_OTHER): Payer: BC Managed Care – PPO | Admitting: Psychiatry

## 2021-05-14 ENCOUNTER — Encounter: Payer: Self-pay | Admitting: Psychiatry

## 2021-05-14 DIAGNOSIS — F3341 Major depressive disorder, recurrent, in partial remission: Secondary | ICD-10-CM | POA: Diagnosis not present

## 2021-05-14 DIAGNOSIS — G4700 Insomnia, unspecified: Secondary | ICD-10-CM

## 2021-05-14 DIAGNOSIS — F4310 Post-traumatic stress disorder, unspecified: Secondary | ICD-10-CM

## 2021-05-14 NOTE — Patient Instructions (Addendum)
Continue duloxetine 90 mg night- monitor weight change Discontinue hydroxyzine Referred for evaluation of sleep apnea Next appointment: 1/12 at 2 PM, in person  The next visit will be in person visit. Please arrive 15 mins before the scheduled time.   [facility]  Address: [address], [place], [place] [address]

## 2021-06-01 IMAGING — CT CT HEAD WITHOUT CONTRAST
3 of 4 series · 15 of 47 positions shown, 18 images · non-contrast
Comparison: 03/14/2016

CLINICAL DATA: New headache.

EXAM:
CT HEAD WITHOUT CONTRAST
TECHNIQUE: Contiguous axial images were obtained from the base of the skull
through the vertex without intravenous contrast.

[Series 4: head 2.0 h70h · axial · 0.43mm/px · z∈[-99,+39]mm · 9 of 87 slices shown, 12 images]
[im 9/87  brain]
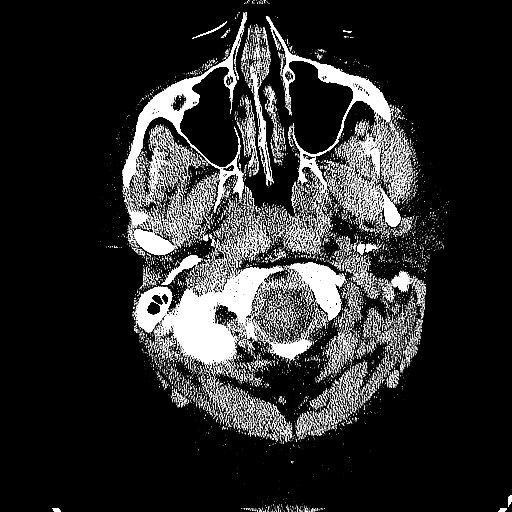
[im 9/87  bone]
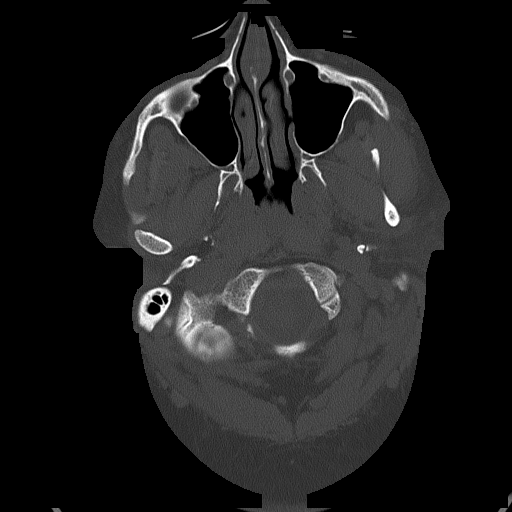
[im 18/87  brain]
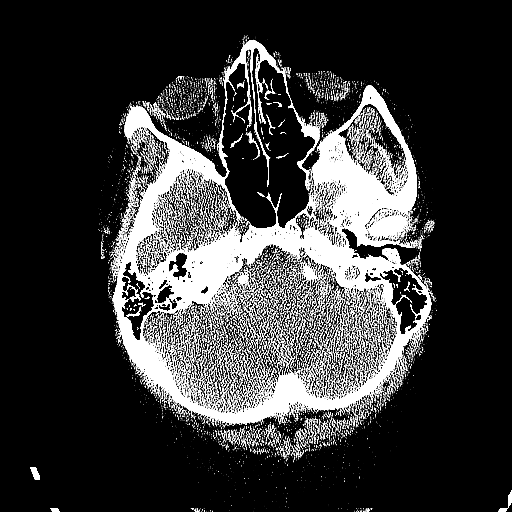
[im 26/87  brain]
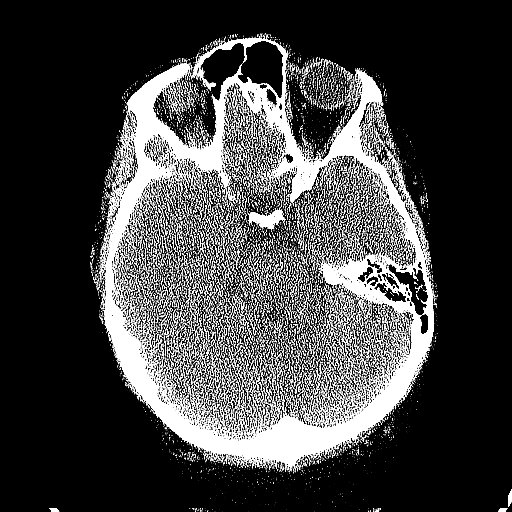
[im 35/87  brain]
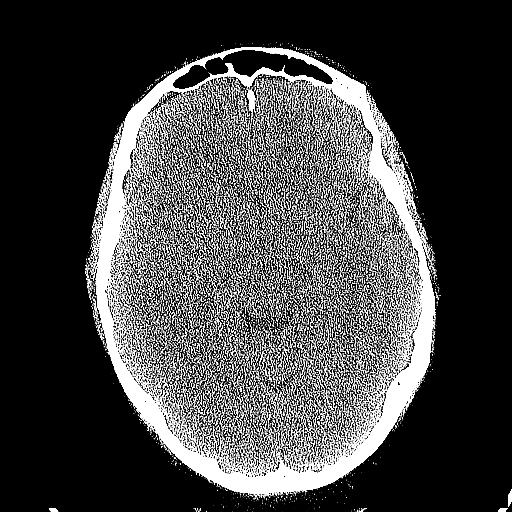
[im 44/87  brain]
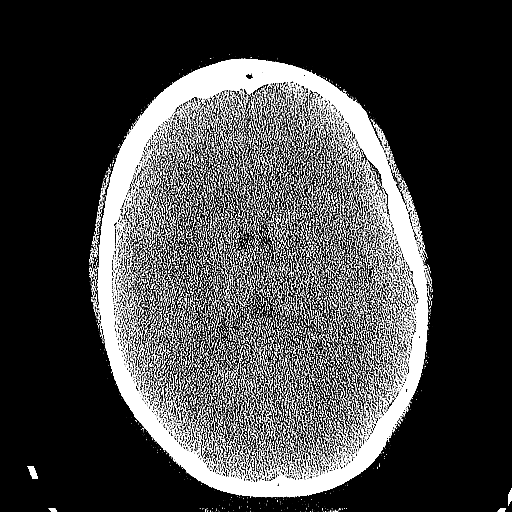
[im 44/87  bone]
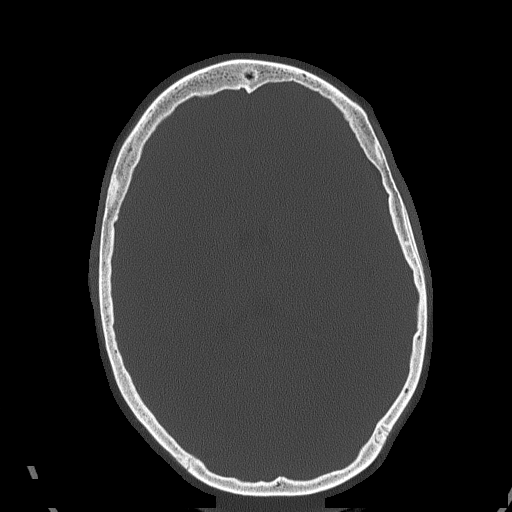
[im 52/87  brain]
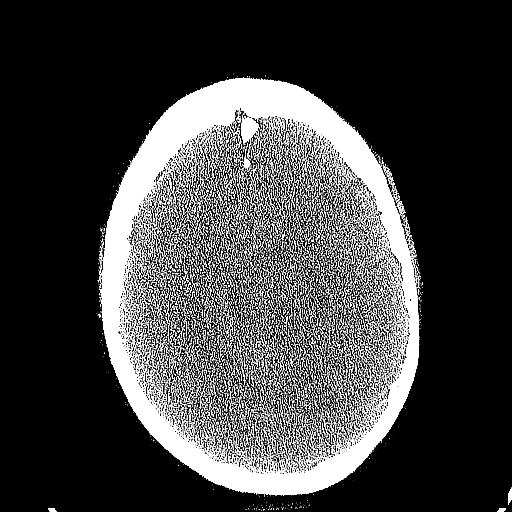
[im 61/87  brain]
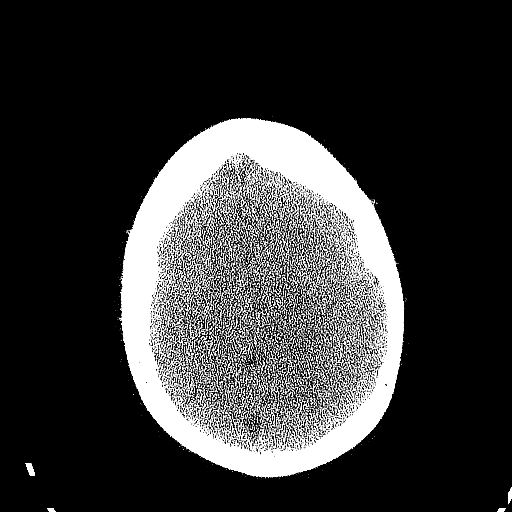
[im 69/87  brain]
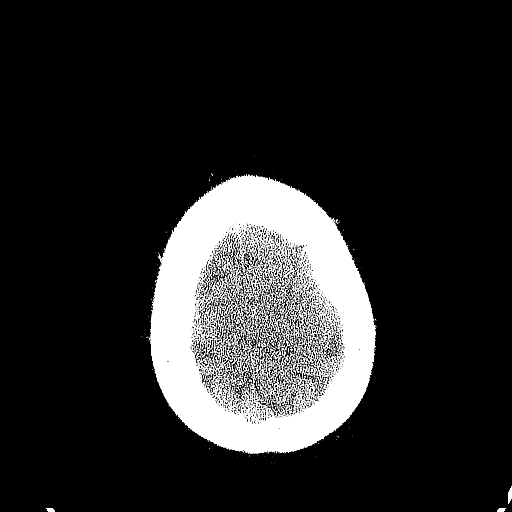
[im 78/87  brain]
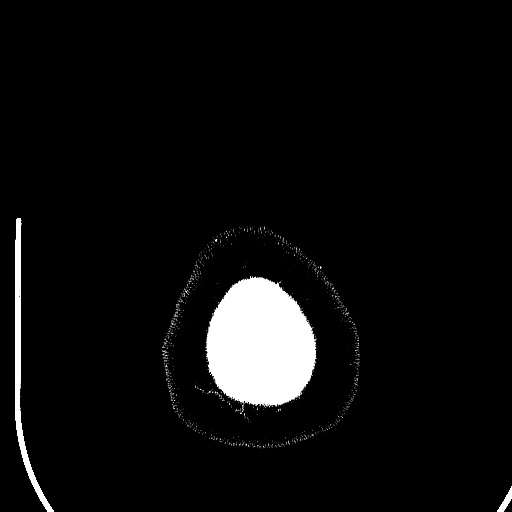
[im 78/87  bone]
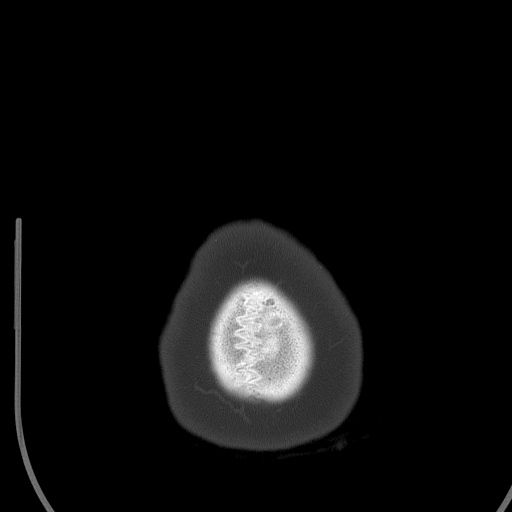

[Series 5: head 3.0 mpr cor · coronal · 0.34mm/px · 3 of 75 slices shown]
[im 25/75  brain]
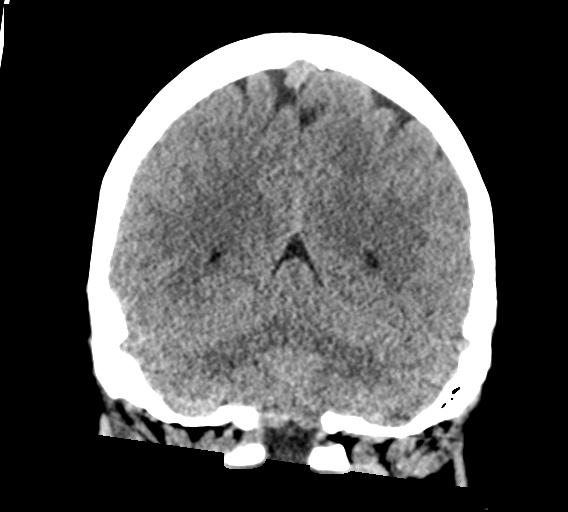
[im 33/75  brain]
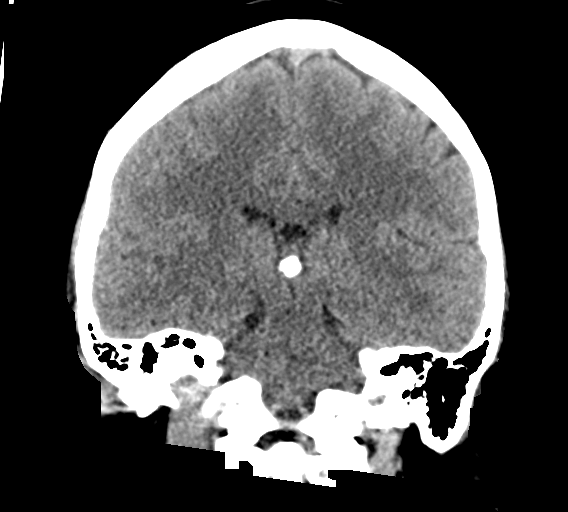
[im 42/75  brain]
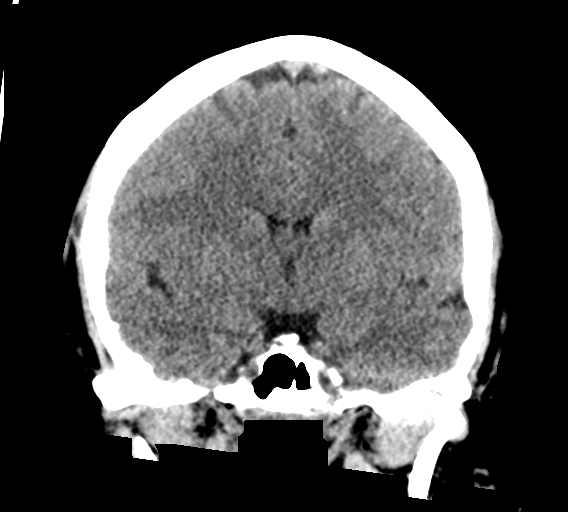

[Series 6: head 3.0 mpr sag · sagittal · 0.34mm/px · 3 of 65 slices shown]
[im 22/65  brain]
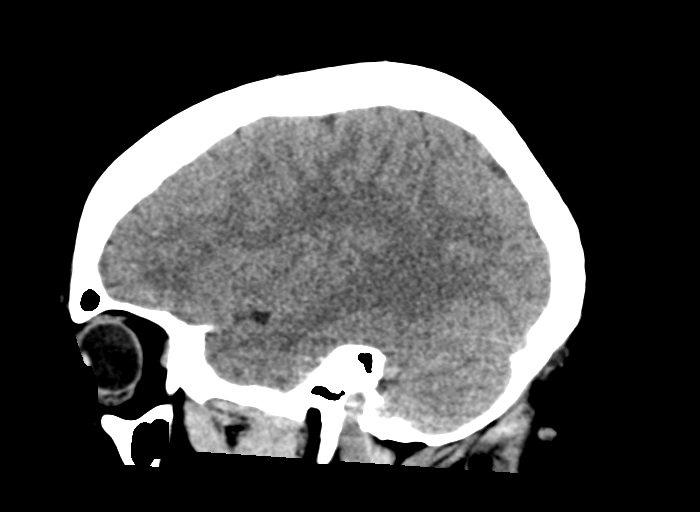
[im 33/65  brain]
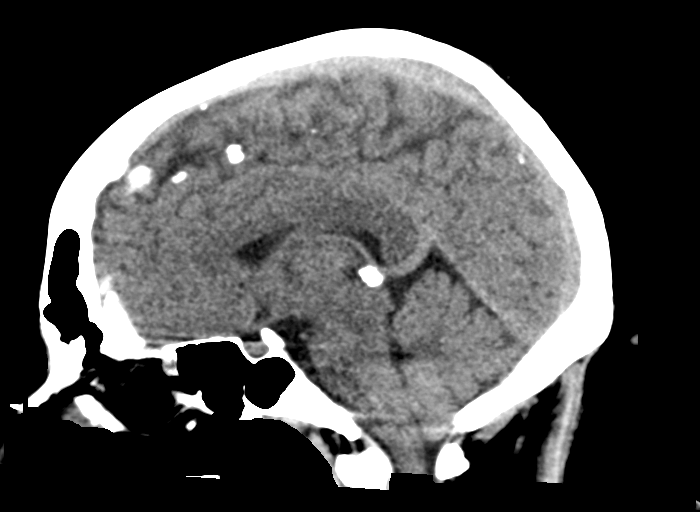
[im 43/65  brain]
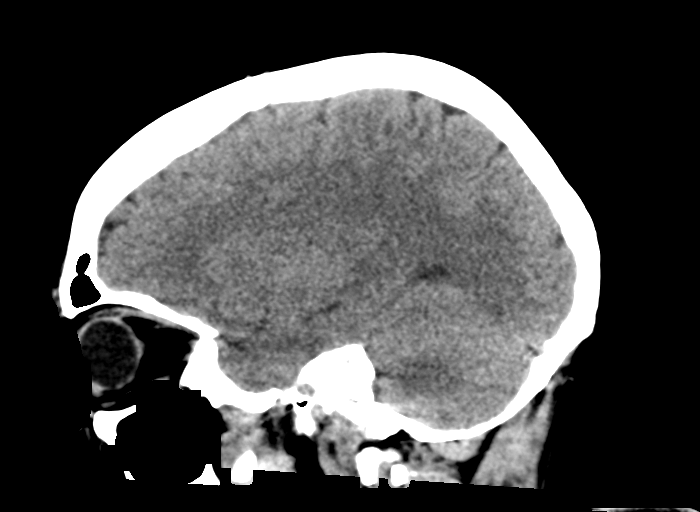

[15 of 47 positions shown; findings below may reference images not displayed]

FINDINGS: Brain: No evidence of acute infarction, hemorrhage, hydrocephalus,
extra-axial collection or mass lesion/mass effect.

Vascular: No hyperdense vessel or unexpected calcification.

Skull: Normal. Negative for fracture or focal lesion.

Sinuses/Orbits: No acute finding.

Other: None.
IMPRESSION: Normal CT evaluation of the head.
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
## 2021-06-25 ENCOUNTER — Ambulatory Visit: Payer: BC Managed Care – PPO | Admitting: Psychiatry

## 2021-07-04 ENCOUNTER — Other Ambulatory Visit: Payer: Self-pay | Admitting: Psychiatry

## 2021-07-06 ENCOUNTER — Other Ambulatory Visit: Payer: Self-pay | Admitting: Psychiatry

## 2021-07-06 MED ORDER — DULOXETINE HCL 30 MG PO CPEP: 90.0000 mg | ORAL_CAPSULE | Freq: Every day | ORAL | 0 refills | Status: DC

## 2021-07-06 NOTE — Telephone Encounter (Signed)
The last appointment she had no show was for in person visit. According to  staffs, they did not have any voice messages from the patient. Refill was ordered as above. Please advise her to see another psychiatrist and/or PCP for continuation of care.

## 2021-07-06 NOTE — Telephone Encounter (Signed)
Ordered duloxetine refills based on the request from the patient. Please advise her to seek another psychiatrist and/or see her PCP for continuity of care as this writer will not be able to prescribe any more refills.

## 2021-07-23 ENCOUNTER — Encounter: Payer: Self-pay | Admitting: Obstetrics & Gynecology

## 2021-07-23 ENCOUNTER — Ambulatory Visit (INDEPENDENT_AMBULATORY_CARE_PROVIDER_SITE_OTHER): Payer: BC Managed Care – PPO | Admitting: Obstetrics & Gynecology

## 2021-07-23 ENCOUNTER — Other Ambulatory Visit: Payer: Self-pay

## 2021-07-23 ENCOUNTER — Other Ambulatory Visit (HOSPITAL_COMMUNITY)
Admission: RE | Admit: 2021-07-23 | Discharge: 2021-07-23 | Disposition: A | Discharge status: Home / Self Care | Payer: BC Managed Care – PPO | Source: Ambulatory Visit | Attending: Obstetrics & Gynecology | Admitting: Obstetrics & Gynecology

## 2021-07-23 VITALS — BP 131/91 | HR 83 | Ht 67.0 in | Wt 271.4 lb

## 2021-07-23 DIAGNOSIS — Z124 Encounter for screening for malignant neoplasm of cervix: Secondary | ICD-10-CM | POA: Insufficient documentation

## 2021-07-23 DIAGNOSIS — Z8742 Personal history of other diseases of the female genital tract: Secondary | ICD-10-CM

## 2021-07-23 DIAGNOSIS — L732 Hidradenitis suppurativa: Secondary | ICD-10-CM

## 2021-07-23 DIAGNOSIS — Z113 Encounter for screening for infections with a predominantly sexual mode of transmission: Secondary | ICD-10-CM

## 2021-07-23 DIAGNOSIS — Z975 Presence of (intrauterine) contraceptive device: Secondary | ICD-10-CM

## 2021-07-23 MED ORDER — CLINDAMYCIN PHOSPHATE 1 % EX LOTN: TOPICAL_LOTION | Freq: Two times a day (BID) | CUTANEOUS | 3 refills | Status: AC

## 2021-07-23 NOTE — Progress Notes (Signed)
GYN VISIT Patient name: [name] MRN [id number]  Date of birth: 05/12/1996 Chief Complaint:   Breast Problem, Vaginal Discharge, Vaginal odor, and Vaginal Itching  History of Present Illness:   [name] is a 26 y.o. [phone] female being seen today for the following issues:  Breast issue: Per Dr. 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  Started getting boils on her breast.  Noted new boils about 6 mos ago- grow to about a quarter-size- no drainage, turn purple and scar.  Notes burning, not necessarily pain/discomfort.     Vaginitis: Notes white discharge and odor with slight itching/burning.  This has been going on for the past 2-3 wks.  Tried Monistat 3 day no improvement.  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  No menses.  Patient's last menstrual period was 04/15/2019 (approximate).  Depression screen [facility] 2/9 02/11/2020 08/17/2018  Decreased Interest 3 1  Down, Depressed, Hopeless 3 1  PHQ - 2 Score 6 2  Altered sleeping 3 0  Tired, decreased energy 3 3  Change in appetite 3 0  Feeling bad or failure about yourself  3 1  Trouble concentrating 3 0  Moving slowly or fidgety/restless 3 0  Suicidal thoughts 0 0  PHQ-9 Score 24 6  Difficult doing work/chores Extremely dIfficult -  Some encounter information is confidential and restricted. Go to Review Flowsheets activity to see all data.  Some recent data might be hidden     Review of Systems:   Pertinent items are noted in HPI Denies fever/chills, dizziness, headaches, visual disturbances, fatigue, shortness of breath, chest pain, abdominal pain, vomiting, no problems with periods, bowel movements, urination, or  intercourse unless otherwise stated above.  Pertinent History Reviewed:  Reviewed past medical,surgical, social, obstetrical and family history.  Reviewed problem list, medications and allergies. Physical Assessment:   Vitals:   07/23/21 1158  BP: (!) 131/91  Pulse: 83  Weight: 271 lb 6.4 oz (123.1 kg)  Height: 5\' 7"  (1.702 m)  Body mass index is 42.51 kg/m.       Physical Examination:   General appearance: alert, well appearing, and in no distress  Psych: mood appropriate, normal affect  Skin: warm & dry   Cardiovascular: normal heart rate noted  Respiratory: normal respiratory effort, no distress  Breast: right breast- no abnormalities noted, left breast- multiple small pink papules noted lower portion of breast, ~ 2cm firm papule- outer quadrant with scaling, no fluctuation, non-tender.  No other abnormalities noted   Abdomen: obese, soft, non-tender   Pelvic: VULVA: normal appearing vulva with no masses, tenderness or lesions, VAGINA: normal appearing vagina with normal color and discharge, no lesions, CERVIX: normal appearing cervix without discharge or lesions, strings visible at os  Extremities: no edema   Chaperone: Angel Neas    Assessment & Plan:  1) HS -reviewed conservative management -plan for topical Clindamycin, f/u prn.  Discussed oral treatment if failure of topical  2) STI screening -GC/C and lab work collected -reviewed safe sex practices -Briefly discussed HIV PrEP- await results of lab work and pt to review on her own as well  3) Vaginitis -await results of testing -no acute evidence of infection noted  -IUD  in proper location  4) Cervical dysplasia -pap due today, further management pending results   Orders Placed This Encounter  Procedures   HIV Antibody (routine testing w rflx)   RPR    Return in about 1 year (around 07/23/2022) for Annual.   Myna Hidalgo, DO Attending Obstetrician & Gynecologist, [facility], [facility]

## 2021-07-29 ENCOUNTER — Telehealth: Payer: Self-pay

## 2021-07-30 LAB — CYTOLOGY - PAP
Chlamydia: NEGATIVE
Comment: NEGATIVE
Comment: NEGATIVE
Comment: NEGATIVE
Comment: NORMAL
Diagnosis: UNDETERMINED — AB
HPV 16: NEGATIVE
HPV 18 / 45: NEGATIVE
High risk HPV: POSITIVE — AB
Neisseria Gonorrhea: NEGATIVE

## 2021-08-01 LAB — HIV ANTIBODY (ROUTINE TESTING W REFLEX): HIV Screen 4th Generation wRfx: NONREACTIVE

## 2021-08-01 LAB — RPR: RPR Ser Ql: NONREACTIVE

## 2021-08-04 ENCOUNTER — Other Ambulatory Visit: Payer: Self-pay | Admitting: Obstetrics & Gynecology

## 2021-08-04 MED ORDER — METRONIDAZOLE 500 MG PO TABS: 500.0000 mg | ORAL_TABLET | Freq: Two times a day (BID) | ORAL | 0 refills | Status: AC

## 2021-08-04 NOTE — Telephone Encounter (Signed)
Called patient regarding lab results of pap and reviewed that this prescription is not covered.  Pt call insurance to find alternative option

## 2021-08-04 NOTE — Progress Notes (Signed)
Rx sent in for Trich 

## 2021-08-06 ENCOUNTER — Encounter: Payer: Self-pay | Admitting: Obstetrics & Gynecology

## 2021-08-06 ENCOUNTER — Institutional Professional Consult (permissible substitution): Payer: Managed Care, Other (non HMO) | Admitting: Neurology

## 2021-08-06 ENCOUNTER — Encounter: Payer: Self-pay | Admitting: Neurology

## 2021-12-04 ENCOUNTER — Other Ambulatory Visit (HOSPITAL_COMMUNITY)
Admission: RE | Admit: 2021-12-04 | Discharge: 2021-12-04 | Disposition: A | Discharge status: Home / Self Care | Payer: Medicaid Other | Source: Ambulatory Visit | Attending: Obstetrics and Gynecology | Admitting: Obstetrics and Gynecology

## 2021-12-04 ENCOUNTER — Ambulatory Visit: Payer: Medicaid Other | Admitting: Obstetrics and Gynecology

## 2021-12-04 ENCOUNTER — Encounter: Payer: Self-pay | Admitting: Obstetrics and Gynecology

## 2021-12-04 DIAGNOSIS — N898 Other specified noninflammatory disorders of vagina: Secondary | ICD-10-CM | POA: Insufficient documentation

## 2021-12-04 DIAGNOSIS — L732 Hidradenitis suppurativa: Secondary | ICD-10-CM | POA: Diagnosis not present

## 2021-12-04 MED ORDER — DOXYCYCLINE HYCLATE 100 MG PO CAPS: 100.0000 mg | ORAL_CAPSULE | Freq: Two times a day (BID) | ORAL | 2 refills | Status: DC

## 2021-12-07 ENCOUNTER — Other Ambulatory Visit: Payer: Self-pay | Admitting: *Deleted

## 2021-12-07 ENCOUNTER — Encounter: Payer: Self-pay | Admitting: *Deleted

## 2021-12-07 LAB — CERVICOVAGINAL ANCILLARY ONLY
Bacterial Vaginitis (gardnerella): POSITIVE — AB
Candida Glabrata: NEGATIVE
Candida Vaginitis: NEGATIVE
Chlamydia: NEGATIVE
Comment: NEGATIVE
Comment: NEGATIVE
Comment: NEGATIVE
Comment: NEGATIVE
Comment: NEGATIVE
Comment: NORMAL
Neisseria Gonorrhea: NEGATIVE
Trichomonas: POSITIVE — AB

## 2021-12-07 MED ORDER — METRONIDAZOLE 500 MG PO TABS: 500.0000 mg | ORAL_TABLET | Freq: Two times a day (BID) | ORAL | 0 refills | Status: DC

## 2021-12-08 ENCOUNTER — Encounter (INDEPENDENT_AMBULATORY_CARE_PROVIDER_SITE_OTHER): Payer: Self-pay | Admitting: *Deleted

## 2022-01-28 ENCOUNTER — Encounter (INDEPENDENT_AMBULATORY_CARE_PROVIDER_SITE_OTHER): Payer: Self-pay | Admitting: *Deleted

## 2022-01-28 ENCOUNTER — Encounter (INDEPENDENT_AMBULATORY_CARE_PROVIDER_SITE_OTHER): Payer: Self-pay | Admitting: Gastroenterology

## 2022-01-28 ENCOUNTER — Ambulatory Visit (INDEPENDENT_AMBULATORY_CARE_PROVIDER_SITE_OTHER): Payer: Medicaid Other | Admitting: Gastroenterology

## 2022-03-16 NOTE — Progress Notes (Deleted)
Office Visit Note  Patient: [name]             Date of Birth: 01-07-1996           MRN: [id number]             PCP: Lianne Moris, PA-C Referring: Richardean Chimera, MD Visit Date: 03/17/2022 Occupation: @GUAROCC @  Subjective:  No chief complaint on file.   History of Present Illness: [name] is a 26 y.o. female here for evaluation of chronic back and hip pain. She has a history of chronic diarrhea and hidradenitis suppurativa. Family history of rheumatoid arthritis and personal testing has been negative for RF or CCP Abs. ***   Activities of Daily Living:  Patient reports morning stiffness for *** {minute/hour:19697}.   Patient {ACTIONS;DENIES/REPORTS:21021675::"Denies"} nocturnal pain.  Difficulty dressing/grooming: {ACTIONS;DENIES/REPORTS:21021675::"Denies"} Difficulty climbing stairs: {ACTIONS;DENIES/REPORTS:21021675::"Denies"} Difficulty getting out of chair: {ACTIONS;DENIES/REPORTS:21021675::"Denies"} Difficulty using hands for taps, buttons, cutlery, and/or writing: {ACTIONS;DENIES/REPORTS:21021675::"Denies"}  No Rheumatology ROS completed.   PMFS History:  Patient Active Problem List   Diagnosis Date Noted   Vaginal discharge 12/04/2021   Hidradenitis suppurativa 12/04/2021   Encounter for IUD insertion 03/13/2020   Chronic hypertension 03/20/2019   History of gestational diabetes 12/25/2018   PTSD (post-traumatic stress disorder) 03/30/2018   MDD (major depressive disorder), recurrent episode, moderate (HCC) 03/30/2018   Multiple acquired skin tags 02/18/2017   Abnormal Pap smear of cervix 07/26/2016   Smoker 05/07/2013    Past Medical History:  Diagnosis Date   Anxiety    Asthma    childhood   Depression    post partum   Dyspnea    Headache    Hypertension    Irregular menstrual bleeding 01/31/2014   PTSD (post-traumatic stress disorder)    pTSD   Recurrent upper respiratory infection (URI)    Bronchitis   Vaginal Pap smear,  abnormal    HPV    Family History  Problem Relation Age of Onset   Bipolar disorder Maternal Grandmother    Heart disease Maternal Grandfather    Thyroid disease Paternal Grandmother    Arthritis Mother    Fibromyalgia Mother    Diabetes Mother    Cancer Mother        vaginal   Birth defects Father    Past Surgical History:  Procedure Laterality Date   CHOLECYSTECTOMY     CYST REMOVAL NECK     TUBAL LIGATION N/A 03/20/2019   Procedure: POST PARTUM TUBAL LIGATION;  Surgeon: 05/20/2019, DO;  Location: [facility] LD [facility]S;  Service: Gynecology;  Laterality: N/A;   TYMPANOSTOMY TUBE PLACEMENT     TYMPANOSTOMY TUBE PLACEMENT     Social History   Social History Narrative   Not on file   Immunization History  Administered Date(s) Administered   Influenza-Unspecified 05/14/2018   Pneumococcal Polysaccharide-23 06/14/2013   Rho (D) Immune Globulin 06/14/2013, 01/08/2019   Tdap 01/26/2017, 01/08/2019     Objective: Vital Signs: There were no vitals taken for this visit.   Physical Exam   Musculoskeletal Exam: ***  CDAI Exam: CDAI Score: -- Patient Global: --; Provider Global: -- Swollen: --; Tender: -- Joint Exam 03/17/2022   No joint exam has been documented for this visit   There is currently no information documented on the homunculus. Go to the Rheumatology activity and complete the homunculus joint exam.  Investigation: No additional findings.  Imaging: No results found.  Recent Labs: Lab Results  Component Value Date   WBC  9.5 03/20/2019   HGB 10.1 (L) 03/20/2019   PLT 154 03/20/2019   NA 136 03/19/2019   K 3.5 03/19/2019   CL 107 03/19/2019   CO2 18 (L) 03/19/2019   GLUCOSE 131 (H) 03/19/2019   BUN <5 (L) 03/19/2019   CREATININE 0.44 03/19/2019   BILITOT 0.5 03/19/2019   ALKPHOS 187 (H) 03/19/2019   AST 18 03/19/2019   ALT 11 03/19/2019   PROT 6.1 (L) 03/19/2019   ALBUMIN 2.5 (L) 03/19/2019   CALCIUM 8.8 (L) 03/19/2019   GFRAA >60 03/19/2019     Speciality Comments: No specialty comments available.  Procedures:  No procedures performed Allergies: Patient has no known allergies.   Assessment / Plan:     Visit Diagnoses: No diagnosis found.  Orders: No orders of the defined types were placed in this encounter.  No orders of the defined types were placed in this encounter.   Face-to-face time spent with patient was *** minutes. Greater than 50% of time was spent in counseling and coordination of care.  Follow-Up Instructions: No follow-ups on file.   Collier Salina, MD  Note - This record has been created using [employer].  Chart creation errors have been sought, but may not always  have been located. Such creation errors do not reflect on  the standard of medical care.

## 2022-03-17 ENCOUNTER — Ambulatory Visit: Payer: Medicaid Other | Attending: Internal Medicine | Admitting: Internal Medicine

## 2022-03-24 ENCOUNTER — Other Ambulatory Visit (HOSPITAL_COMMUNITY)
Admission: RE | Admit: 2022-03-24 | Discharge: 2022-03-24 | Disposition: A | Discharge status: Home / Self Care | Payer: Medicaid Other | Source: Ambulatory Visit | Attending: Obstetrics & Gynecology | Admitting: Obstetrics & Gynecology

## 2022-03-24 ENCOUNTER — Ambulatory Visit: Payer: Medicaid Other | Admitting: Obstetrics & Gynecology

## 2022-03-24 ENCOUNTER — Encounter: Payer: Self-pay | Admitting: Obstetrics & Gynecology

## 2022-03-24 VITALS — BP 132/88 | HR 91 | Ht 67.0 in | Wt 282.1 lb

## 2022-03-24 DIAGNOSIS — Z975 Presence of (intrauterine) contraceptive device: Secondary | ICD-10-CM | POA: Diagnosis not present

## 2022-03-24 DIAGNOSIS — R350 Frequency of micturition: Secondary | ICD-10-CM

## 2022-03-24 DIAGNOSIS — N898 Other specified noninflammatory disorders of vagina: Secondary | ICD-10-CM

## 2022-03-24 LAB — POCT URINALYSIS DIPSTICK OB
Blood, UA: NEGATIVE
Glucose, UA: NEGATIVE
Ketones, UA: NEGATIVE
Nitrite, UA: NEGATIVE
POC,PROTEIN,UA: NEGATIVE

## 2022-03-24 MED ORDER — METRONIDAZOLE 0.75 % VA GEL: 1.0000 | Freq: Every day | VAGINAL | 1 refills | Status: DC

## 2022-03-24 NOTE — Progress Notes (Signed)
GYN VISIT Patient name: [name] MRN [id number]  Date of birth: 08-04-1995 Chief Complaint:   Vaginal Discharge [place] Shark vaginal discharge and foul odor started 1 week ago ) and Urinary Frequency (Started 2 days ago)  History of Present Illness:   [name] is a 26 y.o. [phone] female being seen today for the following concerns:.     About 2 nights ago noted urinary frequency as well as brown smelly discharge.  No itching.  No dysuria.  No fever, no chills, no back pain. No nausea/vomiting.  Not sexually active currently.  Of note this seems to be occurring more frequently and notes h/o BV.  She had done some research and was wondering if it could be cause by using a bidet   No LMP recorded. (Menstrual status: IUD).     11/04/2020    4:39 PM 10/07/2020    3:42 PM 09/02/2020   11:11 AM 07/29/2020    4:43 PM 07/29/2020    4:41 PM  Depression screen PHQ 2/9  Decreased Interest       Down, Depressed, Hopeless       PHQ - 2 Score       Altered sleeping       Tired, decreased energy       Change in appetite       Feeling bad or failure about yourself        Trouble concentrating       Moving slowly or fidgety/restless       Suicidal thoughts       PHQ-9 Score       Difficult doing work/chores          Information is confidential and restricted. Go to Review Flowsheets to unlock data.     Review of Systems:   Pertinent items are noted in HPI Denies fever/chills, dizziness, headaches, visual disturbances, fatigue, shortness of breath, chest pain, abdominal pain, vomiting, no problems with periods, bowel movements or intercourse unless otherwise stated above.  Pertinent History Reviewed:  Reviewed past medical,surgical, social, obstetrical and family history.  Reviewed problem list, medications and allergies. Physical Assessment:   Vitals:   03/24/22 1428  BP: 132/88  Pulse: 91  Weight: 282 lb 2 oz (128 kg)  Height: 5\' 7"  (1.702 m)  Body mass index is  44.19 kg/m.       Physical Examination:   General appearance: alert, well appearing, and in no distress  Psych: mood appropriate, normal affect  Skin: warm & dry   Cardiovascular: normal heart rate noted  Respiratory: normal respiratory effort, no distress  Abdomen: soft, non-tender   Pelvic: VULVA: normal appearing vulva with no masses, tenderness or lesions, VAGINA: normal appearing vagina with normal color, frothy white discharge noted, no lesions, CERVIX: normal appearing cervix without discharge or lesions, strings visulaized  Extremities: no edema   Chaperone:  Lawyer     Assessment & Plan:  1) Vaginitis -concern for BV, Rx sent in -reviewed conservative treatment including no longer using bidet  2) Urinary frequency UA negative, will send culture to confirm   Orders Placed This Encounter  Procedures   Urine Culture   POC Urinalysis Dipstick OB   Meds ordered this encounter  Medications   metroNIDAZOLE (METROGEL VAGINAL) 0.75 % vaginal gel    Sig: Place 1 Applicatorful vaginally at bedtime for 5 days.    Dispense:  70 g    Refill:  1     Return if symptoms worsen  or fail to improve.   Myna Hidalgo, DO Attending Obstetrician & Gynecologist, [facility] for [employer], [facility]

## 2022-03-26 LAB — CERVICOVAGINAL ANCILLARY ONLY
Bacterial Vaginitis (gardnerella): POSITIVE — AB
Candida Glabrata: NEGATIVE
Candida Vaginitis: NEGATIVE
Comment: NEGATIVE
Comment: NEGATIVE
Comment: NEGATIVE

## 2022-05-05 ENCOUNTER — Other Ambulatory Visit: Payer: Self-pay | Admitting: Obstetrics & Gynecology

## 2022-05-05 DIAGNOSIS — N898 Other specified noninflammatory disorders of vagina: Secondary | ICD-10-CM

## 2022-05-05 MED ORDER — METRONIDAZOLE 0.75 % VA GEL: 1.0000 | Freq: Every day | VAGINAL | 1 refills | Status: AC

## 2022-05-05 NOTE — Telephone Encounter (Signed)
Patient called stating that [facility] website never showed she had prescription for metrogel sent in. She wanted to know if you could resend. Please advise.

## 2022-05-05 NOTE — Telephone Encounter (Signed)
Patient states that she never picked up this medication after her visit on 03/24/22. Would like [facility] to [facility] because she is still having symptoms.

## 2022-05-05 NOTE — Addendum Note (Signed)
Addended by: Dereck Ligas on: 05/05/2022 11:17 AM   Modules accepted: Orders

## 2022-06-28 ENCOUNTER — Ambulatory Visit (HOSPITAL_COMMUNITY): Payer: Medicaid Other | Admitting: Psychiatry

## 2022-07-07 ENCOUNTER — Ambulatory Visit (INDEPENDENT_AMBULATORY_CARE_PROVIDER_SITE_OTHER): Payer: Medicaid Other | Admitting: Psychiatry

## 2022-07-07 ENCOUNTER — Encounter (HOSPITAL_COMMUNITY): Payer: Self-pay | Admitting: Psychiatry

## 2022-07-07 DIAGNOSIS — F4310 Post-traumatic stress disorder, unspecified: Secondary | ICD-10-CM

## 2022-07-07 DIAGNOSIS — F331 Major depressive disorder, recurrent, moderate: Secondary | ICD-10-CM

## 2022-07-07 DIAGNOSIS — F411 Generalized anxiety disorder: Secondary | ICD-10-CM | POA: Diagnosis not present

## 2022-07-07 DIAGNOSIS — F17200 Nicotine dependence, unspecified, uncomplicated: Secondary | ICD-10-CM | POA: Diagnosis not present

## 2022-07-07 DIAGNOSIS — F410 Panic disorder [episodic paroxysmal anxiety] without agoraphobia: Secondary | ICD-10-CM

## 2022-07-07 DIAGNOSIS — F5081 Binge eating disorder: Secondary | ICD-10-CM

## 2022-07-07 MED ORDER — DULOXETINE HCL 60 MG PO CPEP: 120.0000 mg | ORAL_CAPSULE | Freq: Every day | ORAL | 0 refills | Status: DC

## 2022-07-07 NOTE — Patient Instructions (Signed)
We increased the Cymbalta to 120 mg once daily today.  This should begin to help with the excesses of anxiety and depression that she has been experiencing as well as decreasing the amount of tearful and irritable episodes you are having.  This has to be combined with psychotherapy and I have made a referral today.  Be sure to continue to practice the deep breathing exercises that we went over today.  Also begin to challenge the thoughts as you noticed the emotions that are accompanying them.  Continue to think about cutting back on smoking.
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
## 2022-08-03 ENCOUNTER — Ambulatory Visit (HOSPITAL_COMMUNITY): Payer: Medicaid Other | Admitting: Clinical

## 2022-08-09 ENCOUNTER — Telehealth (INDEPENDENT_AMBULATORY_CARE_PROVIDER_SITE_OTHER): Payer: Medicaid Other | Admitting: Psychiatry

## 2022-08-09 ENCOUNTER — Encounter (HOSPITAL_COMMUNITY): Payer: Self-pay | Admitting: Psychiatry

## 2022-08-09 DIAGNOSIS — F17210 Nicotine dependence, cigarettes, uncomplicated: Secondary | ICD-10-CM | POA: Diagnosis not present

## 2022-08-09 DIAGNOSIS — G8929 Other chronic pain: Secondary | ICD-10-CM

## 2022-08-09 DIAGNOSIS — F411 Generalized anxiety disorder: Secondary | ICD-10-CM

## 2022-08-09 DIAGNOSIS — F17200 Nicotine dependence, unspecified, uncomplicated: Secondary | ICD-10-CM

## 2022-08-09 DIAGNOSIS — F410 Panic disorder [episodic paroxysmal anxiety] without agoraphobia: Secondary | ICD-10-CM

## 2022-08-09 DIAGNOSIS — F331 Major depressive disorder, recurrent, moderate: Secondary | ICD-10-CM | POA: Diagnosis not present

## 2022-08-09 DIAGNOSIS — F5081 Binge eating disorder: Secondary | ICD-10-CM

## 2022-08-09 MED ORDER — NICOTINE 21 MG/24HR TD PT24: 21.0000 mg | MEDICATED_PATCH | Freq: Every day | TRANSDERMAL | 1 refills | Status: DC

## 2022-08-09 MED ORDER — GABAPENTIN 100 MG PO CAPS: 100.0000 mg | ORAL_CAPSULE | Freq: Three times a day (TID) | ORAL | 2 refills | Status: DC | PRN
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
## 2022-08-09 NOTE — Patient Instructions (Addendum)
We added gabapentin 100 mg 3 times a day as needed for panic today.  Try to avoid taking at nighttime for insomnia until we can get that sleep study.  If he can follow up with your primary care doctor to check the vitamin levels that we have talked about and get that nutrition referral.  I sent in nicotine 21 mg patches today to the pharmacy but if insurance does not cover it please use the following website to hopefully be able to obtain them for free: [email or web address].[email or web address]  Be sure to take them off before bedtime as they can cause nightmares.

## 2022-08-10 ENCOUNTER — Encounter: Payer: Self-pay | Admitting: Obstetrics & Gynecology

## 2022-08-10 ENCOUNTER — Other Ambulatory Visit (HOSPITAL_COMMUNITY)
Admission: RE | Admit: 2022-08-10 | Discharge: 2022-08-10 | Disposition: A | Discharge status: Home / Self Care | Payer: Medicaid Other | Source: Ambulatory Visit | Attending: Obstetrics & Gynecology | Admitting: Obstetrics & Gynecology

## 2022-08-10 ENCOUNTER — Ambulatory Visit: Payer: Medicaid Other | Admitting: Obstetrics & Gynecology

## 2022-08-10 VITALS — BP 139/96 | HR 112 | Ht 67.0 in

## 2022-08-10 DIAGNOSIS — R87610 Atypical squamous cells of undetermined significance on cytologic smear of cervix (ASC-US): Secondary | ICD-10-CM | POA: Diagnosis not present

## 2022-08-10 DIAGNOSIS — Z124 Encounter for screening for malignant neoplasm of cervix: Secondary | ICD-10-CM

## 2022-08-10 DIAGNOSIS — R102 Pelvic and perineal pain: Secondary | ICD-10-CM

## 2022-08-10 DIAGNOSIS — R87810 Cervical high risk human papillomavirus (HPV) DNA test positive: Secondary | ICD-10-CM

## 2022-08-10 DIAGNOSIS — Z113 Encounter for screening for infections with a predominantly sexual mode of transmission: Secondary | ICD-10-CM | POA: Diagnosis not present

## 2022-08-10 NOTE — Progress Notes (Signed)
GYN VISIT Patient name: [name] MRN [id number]  Date of birth: [birth year] Chief Complaint:   Pain w/ intercourse  History of Present Illness:   [name] is a 27 y.o. [phone] female being seen today for the following concern:  Pelvic pain/pain at her cervix:     Pain during intercourse that has happened on occasion- now it seems to be happening every time for the past 3 weeks.   New partner, but states he is the same size as her prior partner.  No pain any other time.  No urinary concerns.  No GI issues.  No period with IUD.  No discharge, itching.  No other acute complaints  No LMP recorded. (Menstrual status: IUD).     07/07/2022   12:04 PM 11/04/2020    4:39 PM 10/07/2020    3:42 PM 09/02/2020   11:11 AM 07/29/2020    4:43 PM  Depression screen PHQ 2/9  Decreased Interest       Down, Depressed, Hopeless       PHQ - 2 Score       Altered sleeping       Tired, decreased energy       Change in appetite       Feeling bad or failure about yourself        Trouble concentrating       Moving slowly or fidgety/restless       Suicidal thoughts       PHQ-9 Score       Difficult doing work/chores          Information is confidential and restricted. Go to Review Flowsheets to unlock data.     Review of Systems:   Pertinent items are noted in HPI Denies fever/chills, dizziness, headaches, visual disturbances, fatigue, shortness of breath, chest pain, abdominal pain, vomiting, no problems with periods, bowel movements, urination, or intercourse unless otherwise stated above.  Pertinent History Reviewed:  Reviewed past medical,surgical, social, obstetrical and family history.  Reviewed problem list, medications and allergies. Physical Assessment:   Vitals:   08/10/22 1632 08/10/22 1637  BP: (!) 133/97 (!) 139/96  Pulse: (!) 123 (!) 112  Height: '5\' 7"'$  (1.702 m)   Body mass index is 44.19 kg/m.       Physical Examination:   General appearance: alert,  well appearing, and in no distress  Psych: mood appropriate, normal affect  Skin: warm & dry   Cardiovascular: normal heart rate noted  Respiratory: normal respiratory effort, no distress  Abdomen: soft, non-tender, no reproducible pain, no rebound, no guarding   Pelvic: VULVA: normal appearing vulva with no masses, tenderness or lesions, VAGINA: normal appearing vagina with normal color and discharge, no lesions, CERVIX: normal appearing cervix without discharge or lesions, no CMT, strings visualized at os.  Some frothy discharge noted.  On bimanual exam- no uterine or adnexal tenderness, no abnormalities appreciated.  Extremities: no edema   Chaperone:  Dr. Alen Bleacher     Assessment & Plan:  1) Cervical pain -no acute abnormalities noted on exam -plan to r/o underlying infectious process '[]'$  if symptoms persist may consider treatment for mycoplasma -reviewed conservative management  2) ASCUS, HPV+ Pap completed today, further management pending results  -IUD in proper position, no issues   Orders Placed This Encounter  Procedures   RPR   HIV Antibody (routine testing w rflx)    Return in about 1 year (around 08/11/2023) for Annual.   Janyth Pupa, DO Attending  [place], Product/process development scientist for [employer], [place]

## 2022-08-11 LAB — HIV ANTIBODY (ROUTINE TESTING W REFLEX): HIV Screen 4th Generation wRfx: NONREACTIVE

## 2022-08-11 LAB — RPR: RPR Ser Ql: NONREACTIVE

## 2022-08-16 LAB — CYTOLOGY - PAP
Chlamydia: NEGATIVE
Comment: NEGATIVE
Comment: NEGATIVE
Comment: NEGATIVE
Comment: NEGATIVE
Comment: NEGATIVE
Comment: NORMAL
Diagnosis: HIGH — AB
HPV 16: NEGATIVE
HPV 18 / 45: NEGATIVE
High risk HPV: POSITIVE — AB
Neisseria Gonorrhea: NEGATIVE
Trichomonas: NEGATIVE

## 2022-08-17 ENCOUNTER — Encounter (HOSPITAL_COMMUNITY): Payer: Self-pay

## 2022-08-31 ENCOUNTER — Ambulatory Visit (INDEPENDENT_AMBULATORY_CARE_PROVIDER_SITE_OTHER): Payer: Medicaid Other | Admitting: Clinical

## 2022-08-31 ENCOUNTER — Encounter (HOSPITAL_COMMUNITY): Payer: Self-pay

## 2022-08-31 DIAGNOSIS — F331 Major depressive disorder, recurrent, moderate: Secondary | ICD-10-CM

## 2022-08-31 DIAGNOSIS — F411 Generalized anxiety disorder: Secondary | ICD-10-CM | POA: Diagnosis not present

## 2022-08-31 DIAGNOSIS — F4310 Post-traumatic stress disorder, unspecified: Secondary | ICD-10-CM | POA: Diagnosis not present
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
## 2022-09-07 ENCOUNTER — Encounter (HOSPITAL_COMMUNITY): Payer: Self-pay | Admitting: Psychiatry

## 2022-09-07 ENCOUNTER — Telehealth (INDEPENDENT_AMBULATORY_CARE_PROVIDER_SITE_OTHER): Payer: Medicaid Other | Admitting: Psychiatry

## 2022-09-07 DIAGNOSIS — F331 Major depressive disorder, recurrent, moderate: Secondary | ICD-10-CM

## 2022-09-07 DIAGNOSIS — F4310 Post-traumatic stress disorder, unspecified: Secondary | ICD-10-CM

## 2022-09-07 DIAGNOSIS — G8929 Other chronic pain: Secondary | ICD-10-CM

## 2022-09-07 DIAGNOSIS — F411 Generalized anxiety disorder: Secondary | ICD-10-CM | POA: Diagnosis not present

## 2022-09-07 DIAGNOSIS — F50811 Binge eating disorder, moderate: Secondary | ICD-10-CM

## 2022-09-07 DIAGNOSIS — F17200 Nicotine dependence, unspecified, uncomplicated: Secondary | ICD-10-CM

## 2022-09-07 DIAGNOSIS — F5081 Binge eating disorder: Secondary | ICD-10-CM

## 2022-09-07 DIAGNOSIS — F410 Panic disorder [episodic paroxysmal anxiety] without agoraphobia: Secondary | ICD-10-CM

## 2022-09-07 MED ORDER — LAMOTRIGINE 25 MG PO TABS: ORAL_TABLET | ORAL | 1 refills | Status: DC

## 2022-09-07 NOTE — Patient Instructions (Signed)
We added lamotrigine 25 mg once daily to your regimen today. Take 1 tablet by mouth daily for 2 weeks then increase to 2 tablets by mouth daily for 2 weeks.  Please follow-up with your PCP about getting a vitamin D, B12, folate, iron panel and TSH with total T3 and free T4 checked.  They can also place a nutrition referral for you and you may benefit from having a sleep study done.
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
## 2022-09-15 ENCOUNTER — Encounter: Payer: Medicaid Other | Admitting: Obstetrics & Gynecology

## 2022-10-05 ENCOUNTER — Ambulatory Visit (INDEPENDENT_AMBULATORY_CARE_PROVIDER_SITE_OTHER): Payer: Medicaid Other | Admitting: Clinical

## 2022-10-05 DIAGNOSIS — F411 Generalized anxiety disorder: Secondary | ICD-10-CM | POA: Diagnosis not present

## 2022-10-05 DIAGNOSIS — F4310 Post-traumatic stress disorder, unspecified: Secondary | ICD-10-CM | POA: Diagnosis not present

## 2022-10-05 DIAGNOSIS — F331 Major depressive disorder, recurrent, moderate: Secondary | ICD-10-CM

## 2022-10-05 DIAGNOSIS — F410 Panic disorder [episodic paroxysmal anxiety] without agoraphobia: Secondary | ICD-10-CM

## 2022-10-05 NOTE — Progress Notes (Signed)
Virtual Visit via Video Note  I connected with [name] on 10/05/22 at 11:00 AM EDT by a video enabled telemedicine application and verified that I am speaking with the correct person using two identifiers.  Location: Patient: Home Provider: Office   I discussed the limitations of evaluation and management by telemedicine and the availability of in person appointments. The patient expressed understanding and agreed to proceed.  THERAPIST PROGRESS NOTE   Session Time: 11:00 AM-11:30 AM   Participation Level: Active   Behavioral Response: CasualAlertDepressed   Type of Therapy: Individual Therapy   Treatment Goals addressed: Coping to assist with Depression and Anxiety/ PTSD   Interventions: CBT, DBT, Solution Focused, Strength-based and Supportive   Summary: [name] is a 27 y.o. female who presents with MDD/PTSD/GAD. The OPT therapist worked with the patient for her OPT treatment. The OPT therapist utilized Motivational Interviewing to assist in creating therapeutic repore. The patient in the session was engaged and work in collaboration with the OPT therapist overviewing work related stressors from learning her 28yr old daughter has now been diagnosed with ADHD, as well as recently the patient lost her job.The OPT therapist utilized Cognitive Behavioral Therapy through cognitive restructuring as well as worked with the patient on coping strategies to assist in management of mood and manage. 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   Suicidal/Homicidal: Nowithout intent/plan   Therapist Response: The OPT therapist worked with the patient for the patients scheduled session. The patient was engaged in her session and gave feedback in relation to triggers, symptoms, and behavior responses over the past few weeks. The OPT therapist overviewed the patient here stressors including  her daughter being diagnosed with ADHD and her loss of employement. The patient continues to actively implement tools to improve her basic health need areas around sleep, eating habits, hygiene, and physical exercise with consistency.The OPT therapist worked with the patient utilizing an in session Cognitive Behavioral Therapy exercise. The patient was responsive in the session and verbalized, " I feel like I have more pressure from family to find a job then support, I have a place I am waiting on and hoping for a call back this  ". The patient spoke about not having any other unforseen changes or transitions over the next few weeks. The OPT therapist will continue treatment work with the patient in her next scheduled session.      Plan: Return again in 2 weeks.   Diagnosis:      Axis I:  PTSD / GAD/ Moderate Depression                         Axis II: No diagnosis   Collaboration of Care: No additional collaboration of care for this session.   Patient/Guardian was advised Release of Information must be obtained prior to any record release in order to collaborate their care with an outside provider. Patient/Guardian was advised if they have not already done so to contact the registration department to sign all necessary forms in order for [place] to release information regarding their care.    Consent: Patient/Guardian gives verbal consent for treatment and assignment of benefits for services provided during this visit. Patient/Guardian expressed understanding and agreed to proceed.      I discussed the assessment and treatment plan with the patient. The patient was provided an  opportunity to ask questions and all were answered. The patient agreed with the plan and demonstrated an understanding of the instructions.   The patient was advised to call back or seek an in-person evaluation if the symptoms worsen or if the condition fails to improve as anticipated.   I provided 30 minutes of non-face-to-face  time during this encounter.   Winfred Burn, LCSW   10/05/2022

## 2022-10-12 ENCOUNTER — Other Ambulatory Visit (HOSPITAL_COMMUNITY): Payer: Self-pay | Admitting: Psychiatry

## 2022-10-12 DIAGNOSIS — F331 Major depressive disorder, recurrent, moderate: Secondary | ICD-10-CM

## 2022-11-02 ENCOUNTER — Ambulatory Visit (HOSPITAL_COMMUNITY): Payer: Medicaid Other | Admitting: Clinical

## 2022-11-12 ENCOUNTER — Telehealth (INDEPENDENT_AMBULATORY_CARE_PROVIDER_SITE_OTHER): Payer: Medicaid Other | Admitting: Psychiatry

## 2022-11-12 ENCOUNTER — Encounter (HOSPITAL_COMMUNITY): Payer: Self-pay | Admitting: Psychiatry

## 2022-11-12 ENCOUNTER — Telehealth (HOSPITAL_COMMUNITY): Payer: Medicaid Other | Admitting: Psychiatry

## 2022-11-12 DIAGNOSIS — F17210 Nicotine dependence, cigarettes, uncomplicated: Secondary | ICD-10-CM | POA: Diagnosis not present

## 2022-11-12 DIAGNOSIS — F331 Major depressive disorder, recurrent, moderate: Secondary | ICD-10-CM | POA: Diagnosis not present

## 2022-11-12 DIAGNOSIS — F411 Generalized anxiety disorder: Secondary | ICD-10-CM

## 2022-11-12 DIAGNOSIS — F4310 Post-traumatic stress disorder, unspecified: Secondary | ICD-10-CM | POA: Diagnosis not present

## 2022-11-12 DIAGNOSIS — F17200 Nicotine dependence, unspecified, uncomplicated: Secondary | ICD-10-CM

## 2022-11-12 DIAGNOSIS — F410 Panic disorder [episodic paroxysmal anxiety] without agoraphobia: Secondary | ICD-10-CM

## 2022-11-12 MED ORDER — ARIPIPRAZOLE 2 MG PO TABS: 2.0000 mg | ORAL_TABLET | Freq: Every day | ORAL | 1 refills | Status: DC

## 2022-11-12 MED ORDER — DULOXETINE HCL 60 MG PO CPEP: 120.0000 mg | ORAL_CAPSULE | Freq: Every day | ORAL | 0 refills | Status: DC
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
## 2022-11-12 NOTE — Patient Instructions (Signed)
We will hold off on retrial of lamotrigine for now and instead start Abilify (aripiprazole) 2 mg daily.  When he get a chance please have your PCP send over an updated EKG looking specifically at the QTc interval as well as her most recent lipid panel and A1c.  Also coordinate with your PCP to start a vitamin D supplement and that should begin to help some of your fatigue.

## 2022-11-26 ENCOUNTER — Ambulatory Visit (INDEPENDENT_AMBULATORY_CARE_PROVIDER_SITE_OTHER): Payer: Medicaid Other | Admitting: Clinical

## 2022-11-26 ENCOUNTER — Ambulatory Visit (HOSPITAL_COMMUNITY): Payer: Medicaid Other | Admitting: Clinical

## 2022-11-26 DIAGNOSIS — F411 Generalized anxiety disorder: Secondary | ICD-10-CM

## 2022-11-26 DIAGNOSIS — F410 Panic disorder [episodic paroxysmal anxiety] without agoraphobia: Secondary | ICD-10-CM | POA: Diagnosis not present

## 2022-11-26 DIAGNOSIS — F4310 Post-traumatic stress disorder, unspecified: Secondary | ICD-10-CM | POA: Diagnosis not present

## 2022-11-26 DIAGNOSIS — F331 Major depressive disorder, recurrent, moderate: Secondary | ICD-10-CM | POA: Diagnosis not present
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
## 2022-12-08 ENCOUNTER — Other Ambulatory Visit (HOSPITAL_COMMUNITY): Payer: Self-pay | Admitting: Psychiatry

## 2022-12-08 DIAGNOSIS — F4310 Post-traumatic stress disorder, unspecified: Secondary | ICD-10-CM

## 2022-12-08 DIAGNOSIS — F331 Major depressive disorder, recurrent, moderate: Secondary | ICD-10-CM

## 2022-12-08 DIAGNOSIS — F410 Panic disorder [episodic paroxysmal anxiety] without agoraphobia: Secondary | ICD-10-CM

## 2022-12-10 ENCOUNTER — Telehealth (INDEPENDENT_AMBULATORY_CARE_PROVIDER_SITE_OTHER): Payer: Medicaid Other | Admitting: Psychiatry

## 2022-12-10 ENCOUNTER — Encounter (HOSPITAL_COMMUNITY): Payer: Self-pay | Admitting: Psychiatry

## 2022-12-10 DIAGNOSIS — F5081 Binge eating disorder: Secondary | ICD-10-CM

## 2022-12-10 DIAGNOSIS — G8929 Other chronic pain: Secondary | ICD-10-CM

## 2022-12-10 DIAGNOSIS — F331 Major depressive disorder, recurrent, moderate: Secondary | ICD-10-CM | POA: Diagnosis not present

## 2022-12-10 DIAGNOSIS — F411 Generalized anxiety disorder: Secondary | ICD-10-CM

## 2022-12-10 DIAGNOSIS — F17200 Nicotine dependence, unspecified, uncomplicated: Secondary | ICD-10-CM

## 2022-12-10 DIAGNOSIS — F4310 Post-traumatic stress disorder, unspecified: Secondary | ICD-10-CM | POA: Diagnosis not present

## 2022-12-10 DIAGNOSIS — Z79899 Other long term (current) drug therapy: Secondary | ICD-10-CM | POA: Insufficient documentation

## 2022-12-10 DIAGNOSIS — F410 Panic disorder [episodic paroxysmal anxiety] without agoraphobia: Secondary | ICD-10-CM

## 2022-12-10 MED ORDER — ARIPIPRAZOLE 5 MG PO TABS: 5.0000 mg | ORAL_TABLET | Freq: Every day | ORAL | 2 refills | Status: DC

## 2022-12-10 NOTE — Patient Instructions (Signed)
We increased the Abilify to 5 mg once daily today.  When you get a chance please coordinate with your PCP as we will need updated lipid panel, A1c, EKG while you are on the Abilify.  Keep up the good work with cutting back on smoking and caffeine.
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
## 2022-12-15 ENCOUNTER — Telehealth (HOSPITAL_COMMUNITY): Payer: Self-pay

## 2022-12-15 NOTE — Telephone Encounter (Signed)
Medication management - Telephone call with pt's [facility] to inform patient's Aripiprazole 5 mg was apprved from online prior authorization done on CoverMyMeds and good until 12/15/2023. [id number].

## 2023-01-06 ENCOUNTER — Ambulatory Visit (INDEPENDENT_AMBULATORY_CARE_PROVIDER_SITE_OTHER): Payer: Medicaid Other | Admitting: Clinical

## 2023-01-06 DIAGNOSIS — F331 Major depressive disorder, recurrent, moderate: Secondary | ICD-10-CM | POA: Diagnosis not present

## 2023-01-06 DIAGNOSIS — F4310 Post-traumatic stress disorder, unspecified: Secondary | ICD-10-CM | POA: Diagnosis not present

## 2023-01-06 DIAGNOSIS — F410 Panic disorder [episodic paroxysmal anxiety] without agoraphobia: Secondary | ICD-10-CM

## 2023-01-06 DIAGNOSIS — F411 Generalized anxiety disorder: Secondary | ICD-10-CM
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
## 2023-01-11 ENCOUNTER — Encounter (HOSPITAL_COMMUNITY): Payer: Self-pay | Admitting: Psychiatry

## 2023-01-11 ENCOUNTER — Telehealth (INDEPENDENT_AMBULATORY_CARE_PROVIDER_SITE_OTHER): Payer: Medicaid Other | Admitting: Psychiatry

## 2023-01-11 DIAGNOSIS — E559 Vitamin D deficiency, unspecified: Secondary | ICD-10-CM

## 2023-01-11 DIAGNOSIS — F1491 Cocaine use, unspecified, in remission: Secondary | ICD-10-CM

## 2023-01-11 DIAGNOSIS — E669 Obesity, unspecified: Secondary | ICD-10-CM

## 2023-01-11 DIAGNOSIS — F4310 Post-traumatic stress disorder, unspecified: Secondary | ICD-10-CM | POA: Diagnosis not present

## 2023-01-11 DIAGNOSIS — G8929 Other chronic pain: Secondary | ICD-10-CM

## 2023-01-11 DIAGNOSIS — F331 Major depressive disorder, recurrent, moderate: Secondary | ICD-10-CM | POA: Diagnosis not present

## 2023-01-11 DIAGNOSIS — F411 Generalized anxiety disorder: Secondary | ICD-10-CM

## 2023-01-11 DIAGNOSIS — F5081 Binge eating disorder: Secondary | ICD-10-CM

## 2023-01-11 DIAGNOSIS — Z720 Tobacco use: Secondary | ICD-10-CM

## 2023-01-11 DIAGNOSIS — Z79899 Other long term (current) drug therapy: Secondary | ICD-10-CM

## 2023-01-11 DIAGNOSIS — F1291 Cannabis use, unspecified, in remission: Secondary | ICD-10-CM

## 2023-01-11 DIAGNOSIS — F410 Panic disorder [episodic paroxysmal anxiety] without agoraphobia: Secondary | ICD-10-CM

## 2023-01-11 MED ORDER — ARIPIPRAZOLE 5 MG PO TABS: 2.5000 mg | ORAL_TABLET | Freq: Every day | ORAL | 2 refills | Status: DC
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
## 2023-01-11 NOTE — Patient Instructions (Signed)
We decreased the abilify to 2.5mg  once daily today. Cut a 5mg  tablet in half to get this dose. Keep up the good work with cutting back on smoking and caffeine. When you see your PCP tomorrow, please get an ecg, lipid panel, and A1c and have them fax the results over to our office.

## 2023-01-12 ENCOUNTER — Encounter (HOSPITAL_COMMUNITY): Payer: Self-pay

## 2023-01-31 ENCOUNTER — Ambulatory Visit (HOSPITAL_COMMUNITY): Payer: Medicaid Other | Admitting: Clinical

## 2023-02-10 ENCOUNTER — Telehealth (HOSPITAL_COMMUNITY): Payer: Self-pay

## 2023-02-10 DIAGNOSIS — F4310 Post-traumatic stress disorder, unspecified: Secondary | ICD-10-CM

## 2023-02-10 DIAGNOSIS — F331 Major depressive disorder, recurrent, moderate: Secondary | ICD-10-CM

## 2023-02-10 MED ORDER — DULOXETINE HCL 60 MG PO CPEP: 120.0000 mg | ORAL_CAPSULE | Freq: Every day | ORAL | 0 refills | Status: DC

## 2023-02-10 NOTE — Telephone Encounter (Addendum)
[facility] in [place] faxed over a refill request for pt's DULoxetine (CYMBALTA) 60 MG capsule . Last filled 01/04/23, pt scheduled 02/21/23. Please advise.   Refill sent.

## 2023-02-16 ENCOUNTER — Telehealth (HOSPITAL_COMMUNITY): Payer: Self-pay

## 2023-02-16 DIAGNOSIS — F410 Panic disorder [episodic paroxysmal anxiety] without agoraphobia: Secondary | ICD-10-CM

## 2023-02-16 DIAGNOSIS — F4310 Post-traumatic stress disorder, unspecified: Secondary | ICD-10-CM

## 2023-02-16 DIAGNOSIS — F331 Major depressive disorder, recurrent, moderate: Secondary | ICD-10-CM

## 2023-02-16 MED ORDER — ARIPIPRAZOLE 5 MG PO TABS: 2.5000 mg | ORAL_TABLET | Freq: Every day | ORAL | 0 refills | Status: DC

## 2023-02-16 NOTE — Telephone Encounter (Addendum)
[facility]  [address] in [place] faxed over a 90 day rx request for ARIPiprazole (ABILIFY) 5 MG tablet wanting a quantity of 45.0. pt is scheduled 02/21/23. Please advise  Refill sent as requested.

## 2023-02-17 ENCOUNTER — Encounter (HOSPITAL_COMMUNITY): Payer: Self-pay

## 2023-02-21 ENCOUNTER — Telehealth (HOSPITAL_COMMUNITY): Payer: Medicaid Other | Admitting: Psychiatry

## 2023-04-21 ENCOUNTER — Ambulatory Visit: Payer: Medicaid Other | Admitting: Obstetrics & Gynecology

## 2023-04-21 ENCOUNTER — Other Ambulatory Visit (HOSPITAL_COMMUNITY)
Admission: RE | Admit: 2023-04-21 | Discharge: 2023-04-21 | Disposition: A | Discharge status: Home / Self Care | Payer: Medicaid Other | Source: Ambulatory Visit | Attending: Obstetrics & Gynecology | Admitting: Obstetrics & Gynecology

## 2023-04-21 VITALS — BP 125/85 | HR 92 | Ht 67.0 in | Wt 241.6 lb

## 2023-04-21 DIAGNOSIS — R87613 High grade squamous intraepithelial lesion on cytologic smear of cervix (HGSIL): Secondary | ICD-10-CM

## 2023-04-21 DIAGNOSIS — R87810 Cervical high risk human papillomavirus (HPV) DNA test positive: Secondary | ICD-10-CM | POA: Insufficient documentation

## 2023-04-21 DIAGNOSIS — Z975 Presence of (intrauterine) contraceptive device: Secondary | ICD-10-CM | POA: Diagnosis not present

## 2023-04-21 DIAGNOSIS — Z113 Encounter for screening for infections with a predominantly sexual mode of transmission: Secondary | ICD-10-CM

## 2023-04-21 DIAGNOSIS — Z3202 Encounter for pregnancy test, result negative: Secondary | ICD-10-CM

## 2023-04-21 LAB — POCT URINE PREGNANCY: Preg Test, Ur: NEGATIVE

## 2023-04-21 NOTE — Progress Notes (Signed)
Patient name: [name] MRN [id number]  Date of birth: 11-10-1995 Chief Complaint:   Colposcopy  History of Present Illness:   [name] is a 27 y.o. [phone] female being seen today for cervical dysplasia management.  Pt has long-standing history of abnormal pap with HPV+.  Typically low grade; however, recent pap HSIL.  Pt has IUD to regulate her menses.  In review, s/p tubal ligation in 2020, noted HMB.  Liletta placed Sept 2021, which has controlled her bleeding.  She notes irregular light spotting.  Denies HMB or postcoital bleeding.  Denies normal discharge, itching or irritation.  She reports no acute GYN concerns   Smoker:  Yes.   New sexual partner:  Yes.   Desires STI screening  Contraception: tubal ligation Liletta in place to regular period  Prior cytology:  Date Result Procedure  04/2023 HSIL w/ HRHPV positive: other (not 16, 18/45) Colposcopy: to be done today  2023 ASCUS w/ HRHPV positive: other (not 16, 18/45) Colposcopy: no biopsies  2021 LSIL w/ HRHPV positive: other (not 16, 18/45) Colposcopy: no biopsies  2019 ASCUS w/ HRHPV positive: other (not 16, 18/45) Colposcopy: CIN 1    No LMP recorded. (Menstrual status: IUD).     08/31/2022   10:19 AM 07/07/2022   12:04 PM 11/04/2020    4:39 PM 10/07/2020    3:42 PM 09/02/2020   11:11 AM  Depression screen PHQ 2/9  Decreased Interest       Down, Depressed, Hopeless       PHQ - 2 Score       Altered sleeping       Tired, decreased energy       Change in appetite       Feeling bad or failure about yourself        Trouble concentrating       Moving slowly or fidgety/restless       Suicidal thoughts       PHQ-9 Score       Difficult doing work/chores          Information is confidential and restricted. Go to Review Flowsheets to unlock data.     Review of Systems:   Pertinent items are noted in HPI Denies fever/chills, dizziness, headaches, visual disturbances, fatigue, shortness of  breath, chest pain, abdominal pain, vomiting. Pertinent History Reviewed:  Reviewed past medical,surgical, social, obstetrical and family history.  Reviewed problem list, medications and allergies. Physical Assessment:   Vitals:   04/21/23 1053  BP: 125/85  Pulse: 92  Weight: 241 lb 9.6 oz (109.6 kg)  Height: 5\' 7"  (1.702 m)  Body mass index is 37.84 kg/m.       Physical Examination:   General appearance: alert, well appearing, and in no distress  Psych: mood appropriate, normal affect  Skin: warm & dry   Cardiovascular: normal heart rate noted  Respiratory: normal respiratory effort, no distress  Abdomen: soft, non-tender   Pelvic: VULVA: normal appearing vulva with no masses, tenderness or lesions, VAGINA: normal appearing vagina with normal color and discharge, no lesions, CERVIX: see colposcopy section, IUD strings present  Extremities: no edema   Chaperone: Faith Rogue     Colposcopy Procedure Note  Indications: HSIL, HPV+    Procedure Details  The risks and benefits of the procedure and Verbal informed consent obtained.  Speculum placed in vagina and excellent visualization of cervix achieved, cervix swabbed x 3 with acetic acid solution.  Findings: Adequate colposcopy is noted  today.  TMZ zone present  Cervix: acetowhite lesion(s) noted with punctation noted at 1 o'clock; ECC and cervical biopsies obtained.    Monsel's applied.  Adequate hemostasis noted  Specimens: ECC and cervical biopsies  Complications: none.  Colposcopic Impression: CIN 2   Plan(Based on 2019 ASCCP recommendations) 1) Cervical dysplasia -Discussed HPV- reviewed incidence and its potential to dysplasia to cervical cancer -Reviewed degree of abnormal pap smears  -Discussed ASCCP guidelines and current recommendations for colposcopy -As above, inform consent obtained and procedure completed -biopsies obtained, further management pending results -should biopsies be abnormal  discussed potential management with excisional procedure or if severe dysplasia patient may consider hysterectomy -Questions and concerns were addressed  2) IUD in place 3) STI screening -swab and blood work to be completed -Encouraged safe sex practices  Myna Hidalgo, DO Attending Obstetrician & Gynecologist, Biochemist, clinical for [employer], [facility]

## 2023-04-22 LAB — CERVICOVAGINAL ANCILLARY ONLY
Chlamydia: NEGATIVE
Comment: NEGATIVE
Comment: NEGATIVE
Comment: NORMAL
Neisseria Gonorrhea: NEGATIVE
Trichomonas: NEGATIVE

## 2023-04-25 ENCOUNTER — Telehealth: Payer: Self-pay | Admitting: Obstetrics & Gynecology

## 2023-04-25 DIAGNOSIS — D069 Carcinoma in situ of cervix, unspecified: Secondary | ICD-10-CM

## 2023-04-25 LAB — SURGICAL PATHOLOGY

## 2023-04-25 NOTE — Telephone Encounter (Signed)
-  Called patient to review results of recent colposcopy and biopsy results -Reviewed findings of CIN-2/3 -Discussed ASCCP recommendations with plan for excisional procedure -Discussed LEEP reviewed risk benefits and alternatives including surveillance -Patient has completed childbearing and desires to proceed with LEEP -Preop referrals sent -Plan to schedule for December 11  Myna Hidalgo, DO Attending Obstetrician & Gynecologist, [facility] for [employer], [facility]

## 2023-05-02 ENCOUNTER — Encounter: Payer: Self-pay | Admitting: Obstetrics & Gynecology

## 2023-05-05 ENCOUNTER — Encounter (HOSPITAL_COMMUNITY): Payer: Self-pay | Admitting: Psychiatry

## 2023-05-05 ENCOUNTER — Telehealth (INDEPENDENT_AMBULATORY_CARE_PROVIDER_SITE_OTHER): Payer: Medicaid Other | Admitting: Psychiatry

## 2023-05-05 ENCOUNTER — Other Ambulatory Visit (HOSPITAL_COMMUNITY): Payer: Self-pay | Admitting: Psychiatry

## 2023-05-05 DIAGNOSIS — F4310 Post-traumatic stress disorder, unspecified: Secondary | ICD-10-CM

## 2023-05-05 DIAGNOSIS — F410 Panic disorder [episodic paroxysmal anxiety] without agoraphobia: Secondary | ICD-10-CM

## 2023-05-05 DIAGNOSIS — F331 Major depressive disorder, recurrent, moderate: Secondary | ICD-10-CM | POA: Diagnosis not present

## 2023-05-05 DIAGNOSIS — F411 Generalized anxiety disorder: Secondary | ICD-10-CM

## 2023-05-05 DIAGNOSIS — F17210 Nicotine dependence, cigarettes, uncomplicated: Secondary | ICD-10-CM

## 2023-05-05 DIAGNOSIS — F17200 Nicotine dependence, unspecified, uncomplicated: Secondary | ICD-10-CM

## 2023-05-05 DIAGNOSIS — F50814 Binge eating disorder, in remission: Secondary | ICD-10-CM

## 2023-05-05 DIAGNOSIS — Z79899 Other long term (current) drug therapy: Secondary | ICD-10-CM

## 2023-05-05 MED ORDER — LAMOTRIGINE 100 MG PO TABS: 100.0000 mg | ORAL_TABLET | Freq: Every day | ORAL | 1 refills | Status: AC

## 2023-05-05 MED ORDER — LAMOTRIGINE 25 MG PO TABS: ORAL_TABLET | ORAL | 0 refills | Status: AC

## 2023-05-05 MED ORDER — DULOXETINE HCL 60 MG PO CPEP: 120.0000 mg | ORAL_CAPSULE | Freq: Every day | ORAL | 1 refills | Status: AC

## 2023-05-05 MED ORDER — ARIPIPRAZOLE 2 MG PO TABS: 2.0000 mg | ORAL_TABLET | Freq: Every day | ORAL | 1 refills | Status: AC
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
## 2023-05-05 NOTE — Patient Instructions (Signed)
Blood work orders have been placed for  vitamin D, B12, folate, iron panel, TSH with total T3 and free T4 so if you are able to come by the clinic on [address] in [place] they can print out the order forms for you to either have them drawn in our lab or take them to your PCP to have them drawn there.  Your PCP will be to want to check a lipid panel, A1c, EKG for monitoring while you are on Abilify.  You may want to switch away from using Red Bull as this has an high amount of caffeine and will likely make you more irritable, coffee would have a lower caffeine content.  We decreased the dose of Abilify back to 2 mg once daily and after you have been on this dose for roughly 3 to 5 days you can start the lamotrigine (Lamictal) at 25 mg nightly for 2 weeks and then increase to 50 mg nightly for 2 weeks.  Once you have completed that dose you can start the 100 mg nightly dosing.

## 2023-05-19 NOTE — Patient Instructions (Addendum)
[name]  05/19/2023     @PREFPERIOPPHARMACY @   Your procedure is scheduled on  05/25/2023.   Report to [facility] at  1040 A.M.   Call this number if you have problems the morning of surgery:  [phone]  If you experience any cold or flu symptoms such as cough, fever, chills, shortness of breath, etc. between now and your scheduled surgery, please notify us at the above number.   Remember:        Your last dose of ozempic should have been on 05/17/2023.   Do not eat after midnight.   You may drink clear liquids until 0840 am on 05/25/2023.    Clear liquids allowed are:                    Water, Juice (No red color; non-citric and without pulp; diabetics please choose diet or no sugar options), Carbonated beverages (diabetics please choose diet or no sugar options), Clear Tea (No creamer, milk, or cream, including half & half and powdered creamer), Black Coffee Only (No creamer, milk or cream, including half & half and powdered creamer), and Clear Sports drink (No red color; diabetics please choose diet or no sugar options)    Take these medicines the morning of surgery with A SIP OF WATER                            abilify, duloxetine, lamictal.    Do not wear jewelry, make-up or nail polish, including gel polish,  artificial nails, or any other type of covering on natural nails (fingers and  toes).  Do not wear lotions, powders, or perfumes, or deodorant.  Do not shave 48 hours prior to surgery.  Men may shave face and neck.  Do not bring valuables to the hospital.  [facility] is not responsible for any belongings or valuables.  Contacts, dentures or bridgework may not be worn into surgery.  Leave your suitcase in the car.  After surgery it may be brought to your room.  For patients admitted to the hospital, discharge time will be determined by your treatment team.  Patients discharged the day of surgery will not be allowed to drive home and must have  someone with them for 24 hours.    Special instructions:   DO NOT smoke tobacco or vape for 24 hours before your procedure.  Please read over the following fact sheets that you were given. Anesthesia Post-op Instructions and Care and Recovery After Surgery         LEEP POST-PROCEDURE INSTRUCTIONS  You may take Ibuprofen, Aleve or Tylenol for pain if needed.  Cramping is normal.  You will have black and/or bloody discharge at first.  This will lighten and then turn clear before completely resolving.  This will take 2 to 3 weeks.  Put nothing in your vagina until the bleeding or discharge stops (usually 2 or3 days).  You need to call if you have redness around the biopsy site, if there is any unusual draining, if the bleeding is heavy, or if you are concerned.  Shower or bathe as normal  We will call you within one week with results or we will discuss the results at your follow-up appointment if needed.  You will need to return for a follow-up Pap smear as directed by your physician.General Anesthesia, Adult, Care After The following information offers guidance on how to  care for yourself after your procedure. Your health care provider may also give you more specific instructions. If you have problems or questions, contact your health care provider. What can I expect after the procedure? After the procedure, it is common for people to: Have pain or discomfort at the IV site. Have nausea or vomiting. Have a sore throat or hoarseness. Have trouble concentrating. Feel cold or chills. Feel weak, sleepy, or tired (fatigue). Have soreness and body aches. These can affect parts of the body that were not involved in surgery. Follow these instructions at home: For the time period you were told by your health care provider:  Rest. Do not participate in activities where you could fall or become injured. Do not drive or use machinery. Do not drink alcohol. Do not take sleeping pills or  medicines that cause drowsiness. Do not make important decisions or sign legal documents. Do not take care of children on your own. General instructions Drink enough fluid to keep your urine pale yellow. If you have sleep apnea, surgery and certain medicines can increase your risk for breathing problems. Follow instructions from your health care provider about wearing your sleep device: Anytime you are sleeping, including during daytime naps. While taking prescription pain medicines, sleeping medicines, or medicines that make you drowsy. Return to your normal activities as told by your health care provider. Ask your health care provider what activities are safe for you. Take over-the-counter and prescription medicines only as told by your health care provider. Do not use any products that contain nicotine or tobacco. These products include cigarettes, chewing tobacco, and vaping devices, such as e-cigarettes. These can delay incision healing after surgery. If you need help quitting, ask your health care provider. Contact a health care provider if: You have nausea or vomiting that does not get better with medicine. You vomit every time you eat or drink. You have pain that does not get better with medicine. You cannot urinate or have bloody urine. You develop a skin rash. You have a fever. Get help right away if: You have trouble breathing. You have chest pain. You vomit blood. These symptoms may be an emergency. Get help right away. Call 911. Do not wait to see if the symptoms will go away. Do not drive yourself to the hospital. Summary After the procedure, it is common to have a sore throat, hoarseness, nausea, vomiting, or to feel weak, sleepy, or fatigue. For the time period you were told by your health care provider, do not drive or use machinery. Get help right away if you have difficulty breathing, have chest pain, or vomit blood. These symptoms may be an emergency. This information  is not intended to replace advice given to you by your health care provider. Make sure you discuss any questions you have with your health care provider. Document Revised: 08/28/2021 Document Reviewed: 08/28/2021 Elsevier Patient Education  2024 [employer].

## 2023-05-20 ENCOUNTER — Encounter (HOSPITAL_COMMUNITY): Payer: Self-pay

## 2023-05-20 ENCOUNTER — Other Ambulatory Visit: Payer: Self-pay

## 2023-05-20 ENCOUNTER — Encounter (HOSPITAL_COMMUNITY)
Admission: RE | Admit: 2023-05-20 | Discharge: 2023-05-20 | Disposition: A | Discharge status: Home / Self Care | Payer: Medicaid Other | Source: Ambulatory Visit | Attending: Obstetrics & Gynecology

## 2023-05-20 DIAGNOSIS — Z01812 Encounter for preprocedural laboratory examination: Secondary | ICD-10-CM | POA: Insufficient documentation

## 2023-05-20 DIAGNOSIS — D069 Carcinoma in situ of cervix, unspecified: Secondary | ICD-10-CM | POA: Insufficient documentation

## 2023-05-20 DIAGNOSIS — Z01818 Encounter for other preprocedural examination: Secondary | ICD-10-CM

## 2023-05-20 LAB — PREGNANCY, URINE: Preg Test, Ur: NEGATIVE

## 2023-05-23 NOTE — H&P (Signed)
[facility]ne[facility] Attending History and Physical  [name] is a 27 y.o. 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   Pt has IUD to regulate her menses.  In review, s/p tubal ligation in 2020, noted HMB.  Liletta placed Sept 2021, which has controlled her bleeding.  She notes irregular light spotting.  Denies HMB or postcoital bleeding.  She reports no acute complaints or concerns.    Denies any abnormal vaginal discharge, fevers, chills, sweats, dysuria, nausea, vomiting, other GI or GU symptoms or other general symptoms.  Past Medical History:  Diagnosis Date   Anxiety    Asthma    childhood   Depression    post partum   Dyspnea    Encounter for IUD insertion 03/13/2020   03/13/20 Liletta   Headache    Hypertension    Irregular menstrual bleeding 01/31/2014   PTSD (post-traumatic stress disorder)    pTSD   Recurrent upper respiratory infection (URI)    Bronchitis   Vaginal Pap smear, abnormal    HPV   Past Surgical History:  Procedure Laterality Date   CHOLECYSTECTOMY     CYST REMOVAL NECK     TUBAL LIGATION N/A 03/20/2019   Procedure: POST PARTUM TUBAL LIGATION;  Surgeon: Levie Heritage, DO;  Location: [facility] LD ORS;  Service: Gynecology;  Laterality: N/A;   TYMPANOSTOMY TUBE PLACEMENT     TYMPANOSTOMY TUBE PLACEMENT     OB History  Gravida Para Term Preterm AB Living  3 3 3     3   SAB IAB Ectopic Multiple Live Births        0 3    # Outcome Date GA Lbr Len/2nd Weight Sex Type Anes PTL Lv  3 Term 03/20/19 [redacted]w[redacted]d 04:55 / 00:21 3475 g M Vag-Spont EPI  LIV  2 Term 01/25/17 [redacted]w[redacted]d / 01:26 3410 g F Vag-Spont EPI  LIV  1 Term 06/13/13 [redacted]w[redacted]d 26:18 / 06:10 3635 g M Vag-Spont EPI N LIV     Birth Comments: caput  Patient denies any other pertinent gynecologic issues.  No current facility-administered  medications on file prior to encounter.   Current Outpatient Medications on File Prior to Encounter  Medication Sig Dispense Refill   levonorgestrel (LILETTA, 52 MG,) 20.1 MCG/DAY IUD 1 each by Intrauterine route once.     OZEMPIC, 2 MG/DOSE, 8 MG/3ML SOPN Inject 2 mg into the skin once a week.     Probiotic Product (PROBIOTIC DAILY PO) Take 2 each by mouth daily.     No Known Allergies  Social History:   reports that she has been smoking cigarettes. She has a 9 pack-year smoking history. She has never used smokeless tobacco. She reports current alcohol use. She reports that she does not currently use drugs after having used the following drugs: Marijuana, Cocaine, and "Crack" cocaine. Family History  Problem Relation Age of Onset   Bipolar disorder Maternal Grandmother    Heart disease Maternal Grandfather    Thyroid disease Paternal Grandmother    Arthritis Mother    Fibromyalgia Mother    Diabetes Mother    Cancer Mother        vaginal   Birth defects Father     Review of Systems: Pertinent items noted in HPI and remainder of comprehensive ROS otherwise negative.  PHYSICAL EXAM: Blood pressure 117/69, pulse  80, temperature 98.1 F (36.7 C), resp. rate (!) 24, height 5\' 7"  (1.702 m), weight 109 kg, SpO2 100%. CONSTITUTIONAL: Well-developed, well-nourished female in no acute distress.  SKIN: Skin is warm and dry. No rash noted. Not diaphoretic. No erythema. No pallor. NEUROLOGIC: Alert and oriented to person, place, and time. Normal reflexes, muscle tone coordination. No cranial nerve deficit noted. PSYCHIATRIC: Normal mood and affect. Normal behavior. Normal judgment and thought content. CARDIOVASCULAR: Normal heart rate noted, regular rhythm RESPIRATORY: Effort and breath sounds normal, no problems with respiration noted ABDOMEN: Soft, nontender, nondistended. PELVIC: deferred MUSCULOSKELETAL: no calf tenderness bilaterally EXT: no edema bilaterally, normal  pulses  Labs: Results for orders placed or performed during the hospital encounter of 05/25/23 (from the past 336 hour(s))  Glucose, capillary   Collection Time: 05/25/23 10:57 AM  Result Value Ref Range   Glucose-Capillary 190 (H) 70 - 99 mg/dL  Results for orders placed or performed during the hospital encounter of 05/20/23 (from the past 336 hour(s))  Pregnancy, urine   Collection Time: 05/20/23  2:11 PM  Result Value Ref Range   Preg Test, Ur NEGATIVE NEGATIVE    Imaging Studies: No results found.  Assessment: High grade dysplasia   Plan: LEEP -NPO -LR @ 125cc/hr -SCDs to OR -Risk/benefits and alternatives reviewed with the patient including but not limited to risk of bleeding, infection and injury such as cervical shortening.  Questions and concerns were addressed and pt desires to proceed  Myna Hidalgo, DO Attending Obstetrician & Gynecologist, [facility] for [facility], [facility]

## 2023-05-25 ENCOUNTER — Ambulatory Visit (HOSPITAL_COMMUNITY)
Admission: RE | Admit: 2023-05-25 | Discharge: 2023-05-25 | Disposition: A | Discharge status: Home / Self Care | Payer: Medicaid Other | Attending: Obstetrics & Gynecology | Admitting: Obstetrics & Gynecology

## 2023-05-25 ENCOUNTER — Encounter (HOSPITAL_COMMUNITY)
Admission: RE | Disposition: A | Discharge status: Home / Self Care | Payer: Self-pay | Source: Home / Self Care | Attending: Obstetrics & Gynecology

## 2023-05-25 ENCOUNTER — Ambulatory Visit (HOSPITAL_COMMUNITY): Payer: Self-pay | Admitting: Certified Registered"

## 2023-05-25 ENCOUNTER — Other Ambulatory Visit: Payer: Self-pay

## 2023-05-25 ENCOUNTER — Ambulatory Visit (HOSPITAL_COMMUNITY): Payer: Medicaid Other | Admitting: Certified Registered"

## 2023-05-25 ENCOUNTER — Encounter (HOSPITAL_COMMUNITY): Payer: Self-pay | Admitting: Obstetrics & Gynecology

## 2023-05-25 DIAGNOSIS — Z793 Long term (current) use of hormonal contraceptives: Secondary | ICD-10-CM | POA: Insufficient documentation

## 2023-05-25 DIAGNOSIS — N871 Moderate cervical dysplasia: Secondary | ICD-10-CM | POA: Diagnosis not present

## 2023-05-25 DIAGNOSIS — Z7985 Long-term (current) use of injectable non-insulin antidiabetic drugs: Secondary | ICD-10-CM | POA: Diagnosis not present

## 2023-05-25 DIAGNOSIS — I10 Essential (primary) hypertension: Secondary | ICD-10-CM | POA: Diagnosis not present

## 2023-05-25 DIAGNOSIS — Z01818 Encounter for other preprocedural examination: Secondary | ICD-10-CM

## 2023-05-25 DIAGNOSIS — F17210 Nicotine dependence, cigarettes, uncomplicated: Secondary | ICD-10-CM | POA: Insufficient documentation

## 2023-05-25 DIAGNOSIS — J45909 Unspecified asthma, uncomplicated: Secondary | ICD-10-CM | POA: Insufficient documentation

## 2023-05-25 DIAGNOSIS — R87613 High grade squamous intraepithelial lesion on cytologic smear of cervix (HGSIL): Secondary | ICD-10-CM | POA: Diagnosis present

## 2023-05-25 DIAGNOSIS — D069 Carcinoma in situ of cervix, unspecified: Secondary | ICD-10-CM

## 2023-05-25 DIAGNOSIS — Z975 Presence of (intrauterine) contraceptive device: Secondary | ICD-10-CM | POA: Diagnosis not present

## 2023-05-25 HISTORY — PX: LEEP: SHX91

## 2023-05-25 LAB — GLUCOSE, CAPILLARY: Glucose-Capillary: 190 mg/dL — ABNORMAL HIGH (ref 70–99)

## 2023-05-25 SURGERY — LEEP (LOOP ELECTROSURGICAL EXCISION PROCEDURE)
Anesthesia: General | Site: Cervix

## 2023-05-25 MED ORDER — FENTANYL CITRATE PF 50 MCG/ML IJ SOSY: 25.0000 ug | PREFILLED_SYRINGE | INTRAMUSCULAR | Status: DC | PRN

## 2023-05-25 MED ORDER — OXYCODONE HCL 5 MG PO TABS: 5.0000 mg | ORAL_TABLET | Freq: Once | ORAL | Status: DC | PRN

## 2023-05-25 MED ORDER — ACETAMINOPHEN 325 MG PO TABS
650.0000 mg | ORAL_TABLET | Freq: Four times a day (QID) | ORAL | Status: AC | PRN
Start: 2023-05-25 — End: ?

## 2023-05-25 MED ORDER — PROPOFOL 500 MG/50ML IV EMUL
INTRAVENOUS | Status: AC
  Filled 2023-05-25: qty 150

## 2023-05-25 MED ORDER — SODIUM CHLORIDE 0.9 % IV SOLN: INTRAVENOUS | Status: DC | PRN

## 2023-05-25 MED ORDER — LIDOCAINE-EPINEPHRINE 0.5 %-1:200000 IJ SOLN
INTRAMUSCULAR | Status: AC
  Filled 2023-05-25: qty 50

## 2023-05-25 MED ORDER — ACETIC ACID 4% SOLUTION
Status: DC | PRN
  Administered 2023-05-25: 1 via TOPICAL

## 2023-05-25 MED ORDER — FENTANYL CITRATE (PF) 100 MCG/2ML IJ SOLN
INTRAMUSCULAR | Status: AC
  Filled 2023-05-25: qty 2

## 2023-05-25 MED ORDER — FENTANYL CITRATE (PF) 100 MCG/2ML IJ SOLN
INTRAMUSCULAR | Status: DC | PRN
  Administered 2023-05-25 (×2): 50 ug via INTRAVENOUS

## 2023-05-25 MED ORDER — PROPOFOL 10 MG/ML IV BOLUS
INTRAVENOUS | Status: DC | PRN
  Administered 2023-05-25: 75 mg via INTRAVENOUS

## 2023-05-25 MED ORDER — ORAL CARE MOUTH RINSE: 15.0000 mL | Freq: Once | OROMUCOSAL | Status: DC

## 2023-05-25 MED ORDER — CHLORHEXIDINE GLUCONATE 0.12 % MT SOLN: 15.0000 mL | Freq: Once | OROMUCOSAL | Status: DC

## 2023-05-25 MED ORDER — MONSELS FERRIC SUBSULFATE EX SOLN
CUTANEOUS | Status: DC | PRN
  Administered 2023-05-25: 1 via TOPICAL

## 2023-05-25 MED ORDER — PROPOFOL 500 MG/50ML IV EMUL
INTRAVENOUS | Status: DC | PRN
  Administered 2023-05-25: 100 ug/kg/min via INTRAVENOUS

## 2023-05-25 MED ORDER — 0.9 % SODIUM CHLORIDE (POUR BTL) OPTIME
TOPICAL | Status: DC | PRN
  Administered 2023-05-25: 500 mL

## 2023-05-25 MED ORDER — MONSELS FERRIC SUBSULFATE EX SOLN
CUTANEOUS | Status: AC
  Filled 2023-05-25: qty 8

## 2023-05-25 MED ORDER — LACTATED RINGERS IV SOLN: INTRAVENOUS | Status: DC

## 2023-05-25 MED ORDER — MIDAZOLAM HCL 2 MG/2ML IJ SOLN
INTRAMUSCULAR | Status: AC
Start: 2023-05-25 — End: ?
  Filled 2023-05-25: qty 2

## 2023-05-25 MED ORDER — IBUPROFEN 600 MG PO TABS: 600.0000 mg | ORAL_TABLET | Freq: Four times a day (QID) | ORAL | 0 refills | Status: AC | PRN

## 2023-05-25 MED ORDER — ONDANSETRON HCL 4 MG/2ML IJ SOLN: 4.0000 mg | Freq: Once | INTRAMUSCULAR | Status: DC | PRN

## 2023-05-25 MED ORDER — MIDAZOLAM HCL 5 MG/5ML IJ SOLN
INTRAMUSCULAR | Status: DC | PRN
  Administered 2023-05-25: 2 mg via INTRAVENOUS

## 2023-05-25 MED ORDER — OXYCODONE HCL 5 MG/5ML PO SOLN: 5.0000 mg | Freq: Once | ORAL | Status: DC | PRN

## 2023-05-25 MED ORDER — LIDOCAINE-EPINEPHRINE 0.5 %-1:200000 IJ SOLN
INTRAMUSCULAR | Status: DC | PRN
  Administered 2023-05-25: 30 mL

## 2023-05-25 SURGICAL SUPPLY — 27 items
APPLICATOR COTTON TIP 6 STRL (MISCELLANEOUS) ×1 IMPLANT
APPLICATOR COTTON TIP 6IN STRL (MISCELLANEOUS) ×2
CLOTH BEACON ORANGE TIMEOUT ST (SAFETY) ×1 IMPLANT
COVER LIGHT HANDLE STERIS (MISCELLANEOUS) ×2 IMPLANT
ELECT BALL LEEP 5MM RED (ELECTRODE) ×1 IMPLANT
ELECT LOOP LEEP RND 20X12 WHT (CUTTING LOOP) ×1
ELECT REM PT RETURN 9FT ADLT (ELECTROSURGICAL) ×1
ELECTRODE LOOP LP RND 20X12WHT (CUTTING LOOP) IMPLANT
ELECTRODE REM PT RTRN 9FT ADLT (ELECTROSURGICAL) ×1 IMPLANT
GAUZE 4X4 16PLY ~~LOC~~+RFID DBL (SPONGE) ×2 IMPLANT
GLOVE BIO SURGEON STRL SZ 6.5 (GLOVE) ×1 IMPLANT
GLOVE BIO SURGEON STRL SZ7 (GLOVE) ×1 IMPLANT
GLOVE BIOGEL PI IND STRL 7.0 (GLOVE) ×3 IMPLANT
GOWN STRL REUS W/ TWL LRG LVL3 (GOWN DISPOSABLE) ×1 IMPLANT
GOWN STRL REUS W/TWL LRG LVL3 (GOWN DISPOSABLE) ×1 IMPLANT
KIT TURNOVER CYSTO (KITS) ×1 IMPLANT
NDL HYPO 18GX1.5 BLUNT FILL (NEEDLE) ×1 IMPLANT
NEEDLE HYPO 18GX1.5 BLUNT FILL (NEEDLE) ×1
NS IRRIG 500ML POUR BTL (IV SOLUTION) ×1 IMPLANT
PACK PERI GYN (CUSTOM PROCEDURE TRAY) ×1 IMPLANT
PAD ARMBOARD 7.5X6 YLW CONV (MISCELLANEOUS) ×1 IMPLANT
PAD TELFA 3X4 1S STER (GAUZE/BANDAGES/DRESSINGS) ×1 IMPLANT
POSITIONER HEAD 8X9X4 ADT (SOFTGOODS) ×1 IMPLANT
SCOPETTES 8 STERILE (MISCELLANEOUS) ×1 IMPLANT
SET BASIN LINEN APH (SET/KITS/TRAYS/PACK) ×1 IMPLANT
SUT VIC AB 0 CT1 27XBRD ANBCTR (SUTURE) ×1 IMPLANT
SYR CONTROL 10ML LL (SYRINGE) ×1 IMPLANT

## 2023-05-25 NOTE — Discharge Instructions (Signed)
HOME INSTRUCTIONS  Please note any unusual or excessive bleeding, pain, swelling. Mild dizziness or drowsiness are normal for about 24 hours after surgery.   Shower when comfortable  Restrictions: No driving for 24 hours or while taking pain medications.  Activity:  No heavy lifting (> 10 lbs), nothing in vagina (no tampons, douching, or intercourse) x 4 weeks; no tub baths for 4 weeks Vaginal spotting is expected but if your bleeding is heavy, period like,  please call the office   Diet:  You may return to your regular diet.  Do not eat large meals.  Eat small frequent meals throughout the day.  Continue to drink a good amount of water at least 6-8 glasses of water per day, hydration is very important for the healing process.  Pain Management: Take over the counter tylenol or ibuprofen as needed for pain.  You can either take one or alternate between the two medications for pain management.  You may also use a heating pack as needed.    Alcohol -- Avoid for 24 hours and while taking pain medications.  Nausea: Take sips of ginger ale or soda  Fever -- Call physician if temperature over 101 degrees  Follow up:  If you do not already have a follow up appointment scheduled, please call the office at [phone].  If you experience fever (a temperature greater than 100.4), pain unrelieved by pain medication, shortness of breath, swelling of a single leg, or any other symptoms which are concerning to you please the office immediately.  

## 2023-05-25 NOTE — Anesthesia Preprocedure Evaluation (Signed)
Anesthesia Evaluation  Patient identified by MRN, date of birth, ID band Patient awake    Reviewed: Allergy & Precautions, H&P , NPO status , Patient's Chart, lab work & pertinent test results, reviewed documented beta blocker date and time   Airway Mallampati: II  TM Distance: >3 FB Neck ROM: full    Dental no notable dental hx.    Pulmonary shortness of breath, asthma , Recent URI , Current Smoker and Patient abstained from smoking.   Pulmonary exam normal breath sounds clear to auscultation       Cardiovascular Exercise Tolerance: Good hypertension,  Rhythm:regular Rate:Normal     Neuro/Psych  Headaches PSYCHIATRIC DISORDERS Anxiety Depression       GI/Hepatic negative GI ROS, Neg liver ROS,,,  Endo/Other  negative endocrine ROS    Renal/GU negative Renal ROS  negative genitourinary   Musculoskeletal   Abdominal   Peds  Hematology negative hematology ROS (+)   Anesthesia Other Findings   Reproductive/Obstetrics negative OB ROS                             Anesthesia Physical Anesthesia Plan  ASA: 3  Anesthesia Plan: General   Post-op Pain Management:    Induction:   PONV Risk Score and Plan: Propofol infusion  Airway Management Planned:   Additional Equipment:   Intra-op Plan:   Post-operative Plan:   Informed Consent: I have reviewed the patients History and Physical, chart, labs and discussed the procedure including the risks, benefits and alternatives for the proposed anesthesia with the patient or authorized representative who has indicated his/her understanding and acceptance.     Dental Advisory Given  Plan Discussed with: CRNA  Anesthesia Plan Comments:        Anesthesia Quick Evaluation

## 2023-05-25 NOTE — Transfer of Care (Signed)
Immediate Anesthesia Transfer of Care Note  Patient: [name]  Procedure(s) Performed: LOOP ELECTROSURGICAL EXCISION PROCEDURE (LEEP) (Cervix)  Patient Location: PACU  Anesthesia Type:MAC  Level of Consciousness: awake, alert , and oriented  Airway & Oxygen Therapy: Patient Spontanous Breathing  Post-op Assessment: Report given to RN and Post -op Vital signs reviewed and stable  Post vital signs: Reviewed and stable  Last Vitals:  Vitals Value Taken Time  BP 120/80   Temp 98   Pulse 96 05/25/23 1157  Resp 20 05/25/23 1157  SpO2 98 % 05/25/23 1157  Vitals shown include unfiled device data.  Last Pain: There were no vitals filed for this visit.    Patients Stated Pain Goal: 2 (05/25/23 1057)  Complications: No notable events documented.

## 2023-05-25 NOTE — Op Note (Signed)
OPERATIVE NOTE  PREOPERATIVE DIAGNOSIS:  High grade dysplasia POSTOPERATIVE DIAGNOSIS: same PROCEDURE PERFORMED: Colposcopy, LEEP SURGEON: Dr. Myna Hidalgo ANESTHESIA: General endotracheal.  ESTIMATED BLOOD LOSS: 5cc.  IV FLUIDS: 400cc of crystalloid.  SPECIMEN(S): 1) cervical biopsy- anterior and posterior 2) inner margins both anterior and posterior COMPLICATIONS: None.  CONDITION: Stable.  FINDINGS: Significant acetowhite changes with mosaicism and abnormal vascularity noted at 1 o'clock.  IUD strings in place.  ??Procedure: Informed consent was obtained from the patient prior to taking her to the operating room where anesthesia was found to be adequate. She was placed in dorsal lithotomy.  She was prepped and draped in normal sterile fashion.  A bivalved coated speculum was placed in the patient's vagina. A grounding pad placed on the patient. Acetic acid solution was applied to the cervix and areas of decreased uptake were noted around the transformation zone.   Local anesthesia was administered via an intracervical block using 30 ml of 0.5% Lidocaine with epinephrine. The suction was turned on and the Large 1X Fisher Cone Biopsy Excisor on 50 Watts of blended current was used to excise the area of decreased uptake and excise the entire transformation zone.  Excellent hemostasis was achieved using roller ball coagulation set at 50 Watts coagulation current. Monsel's solution was then applied and the speculum was removed from the vagina. Specimens were sent to pathology.  Counts were correct and pt was taken to recovery room in stable condition.  Myna Hidalgo, DO Attending Obstetrician & Gynecologist, [facility] for [employer], [facility]

## 2023-05-26 LAB — SURGICAL PATHOLOGY

## 2023-05-27 ENCOUNTER — Encounter (HOSPITAL_COMMUNITY): Payer: Self-pay | Admitting: Obstetrics & Gynecology

## 2023-05-29 NOTE — Anesthesia Postprocedure Evaluation (Signed)
Anesthesia Post Note  Patient: [name]  Procedure(s) Performed: LOOP ELECTROSURGICAL EXCISION PROCEDURE (LEEP) (Cervix)  Patient location during evaluation: Phase II Anesthesia Type: General Level of consciousness: awake Pain management: pain level controlled Vital Signs Assessment: post-procedure vital signs reviewed and stable Respiratory status: spontaneous breathing and respiratory function stable Cardiovascular status: blood pressure returned to baseline and stable Postop Assessment: no headache and no apparent nausea or vomiting Anesthetic complications: no Comments: Late entry   No notable events documented.   Last Vitals:  Vitals:   05/25/23 1215 05/25/23 1222  BP: 121/70 110/64  Pulse: 93 89  Resp: (!) 30 (!) 26  Temp:  36.5 C  SpO2: 100% 99%    Last Pain:  Vitals:   05/25/23 1222  TempSrc: Oral  PainSc: 0-No pain                 [name]

## 2023-06-02 ENCOUNTER — Telehealth (HOSPITAL_COMMUNITY): Payer: Medicaid Other | Admitting: Psychiatry

## 2023-06-02 ENCOUNTER — Other Ambulatory Visit (HOSPITAL_COMMUNITY): Payer: Self-pay | Admitting: Psychiatry

## 2023-06-02 ENCOUNTER — Encounter: Payer: Medicaid Other | Admitting: Obstetrics & Gynecology

## 2023-06-02 DIAGNOSIS — F331 Major depressive disorder, recurrent, moderate: Secondary | ICD-10-CM

## 2023-06-03 ENCOUNTER — Telehealth (HOSPITAL_COMMUNITY): Payer: Medicaid Other | Admitting: Psychiatry

## 2023-07-17 ENCOUNTER — Emergency Department (HOSPITAL_COMMUNITY)
Admission: EM | Admit: 2023-07-17 | Discharge: 2023-07-17 | Disposition: A | Discharge status: Home / Self Care | Payer: Medicaid Other | Attending: Emergency Medicine | Admitting: Emergency Medicine

## 2023-07-17 ENCOUNTER — Emergency Department (HOSPITAL_COMMUNITY): Payer: Medicaid Other

## 2023-07-17 ENCOUNTER — Other Ambulatory Visit: Payer: Self-pay

## 2023-07-17 ENCOUNTER — Encounter (HOSPITAL_COMMUNITY): Payer: Self-pay | Admitting: Emergency Medicine

## 2023-07-17 DIAGNOSIS — Y92410 Unspecified street and highway as the place of occurrence of the external cause: Secondary | ICD-10-CM | POA: Insufficient documentation

## 2023-07-17 DIAGNOSIS — R079 Chest pain, unspecified: Secondary | ICD-10-CM | POA: Diagnosis present

## 2023-07-17 DIAGNOSIS — S20212A Contusion of left front wall of thorax, initial encounter: Secondary | ICD-10-CM | POA: Insufficient documentation

## 2023-07-17 MED ORDER — IBUPROFEN 800 MG PO TABS
800.0000 mg | ORAL_TABLET | Freq: Once | ORAL | Status: AC
  Administered 2023-07-17: 800 mg via ORAL
  Filled 2023-07-17: qty 1

## 2023-07-17 MED ORDER — NAPROXEN 500 MG PO TABS
500.0000 mg | ORAL_TABLET | Freq: Two times a day (BID) | ORAL | 0 refills | Status: AC
Start: 2023-07-17 — End: ?

## 2023-07-17 NOTE — Discharge Instructions (Signed)
Xray  normal  Naprosyn for pain
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
## 2023-08-16 ENCOUNTER — Telehealth (HOSPITAL_COMMUNITY): Payer: Medicaid Other | Admitting: Psychiatry

## 2023-08-19 ENCOUNTER — Other Ambulatory Visit (HOSPITAL_COMMUNITY): Payer: Self-pay | Admitting: Psychiatry

## 2023-08-19 DIAGNOSIS — F331 Major depressive disorder, recurrent, moderate: Secondary | ICD-10-CM

## 2023-08-29 ENCOUNTER — Other Ambulatory Visit (HOSPITAL_COMMUNITY): Payer: Self-pay | Admitting: Psychiatry

## 2023-08-29 DIAGNOSIS — F331 Major depressive disorder, recurrent, moderate: Secondary | ICD-10-CM

## 2024-02-07 ENCOUNTER — Emergency Department (HOSPITAL_COMMUNITY)
Admission: EM | Admit: 2024-02-07 | Discharge: 2024-02-07 | Discharge status: Against Medical Advice | Attending: Emergency Medicine | Admitting: Emergency Medicine

## 2024-02-07 ENCOUNTER — Encounter (HOSPITAL_COMMUNITY): Payer: Self-pay

## 2024-02-07 ENCOUNTER — Other Ambulatory Visit: Payer: Self-pay

## 2024-02-07 DIAGNOSIS — M7918 Myalgia, other site: Secondary | ICD-10-CM | POA: Diagnosis not present

## 2024-02-07 DIAGNOSIS — Z5321 Procedure and treatment not carried out due to patient leaving prior to being seen by health care provider: Secondary | ICD-10-CM | POA: Diagnosis not present

## 2024-02-07 DIAGNOSIS — R112 Nausea with vomiting, unspecified: Secondary | ICD-10-CM | POA: Insufficient documentation

## 2024-02-07 DIAGNOSIS — K921 Melena: Secondary | ICD-10-CM | POA: Insufficient documentation

## 2024-02-07 NOTE — ED Triage Notes (Addendum)
 Pt to ED with c/o blood in stools after sharp pain in abdomen today, pt also reports random nausea and vomiting since Aug 10th. Pt reports body aches. Pt says she has hx of hemorrhoids but that 5 years ago.

## 2024-06-15 ENCOUNTER — Ambulatory Visit: Payer: Self-pay
